# Patient Record
Sex: Female | Born: 1970
Health system: Southern US, Community
[De-identification: ages and names within clinical notes are randomized; demographics above are authoritative.]

## PROBLEM LIST (undated history)

## (undated) DIAGNOSIS — K219 Gastro-esophageal reflux disease without esophagitis: Secondary | ICD-10-CM

## (undated) DIAGNOSIS — I10 Essential (primary) hypertension: Secondary | ICD-10-CM

## (undated) DIAGNOSIS — E119 Type 2 diabetes mellitus without complications: Secondary | ICD-10-CM

## (undated) DIAGNOSIS — E785 Hyperlipidemia, unspecified: Secondary | ICD-10-CM

## (undated) HISTORY — PX: OTHER SURGICAL HISTORY: SHX169

## (undated) HISTORY — DX: Hyperlipidemia, unspecified: E78.5

## (undated) HISTORY — DX: Gastro-esophageal reflux disease without esophagitis: K21.9

## (undated) HISTORY — PX: UTERINE FIBROID SURGERY: SHX826

---

## 2008-09-21 ENCOUNTER — Inpatient Hospital Stay (HOSPITAL_COMMUNITY): Admission: AD | Admit: 2008-09-21 | Discharge: 2008-09-23 | Payer: Self-pay | Admitting: Obstetrics

## 2008-09-22 ENCOUNTER — Encounter: Payer: Self-pay | Admitting: Obstetrics & Gynecology

## 2008-10-22 ENCOUNTER — Ambulatory Visit (HOSPITAL_COMMUNITY): Admission: RE | Admit: 2008-10-22 | Discharge: 2008-10-22 | Payer: Self-pay | Admitting: Obstetrics

## 2009-06-01 ENCOUNTER — Emergency Department (HOSPITAL_COMMUNITY): Admission: EM | Admit: 2009-06-01 | Discharge: 2009-06-01 | Payer: Self-pay | Admitting: Emergency Medicine

## 2010-06-26 ENCOUNTER — Encounter: Payer: Self-pay | Admitting: Obstetrics and Gynecology

## 2010-06-26 ENCOUNTER — Inpatient Hospital Stay (HOSPITAL_COMMUNITY): Admission: AD | Admit: 2010-06-26 | Discharge: 2010-06-29 | Payer: Self-pay | Admitting: Obstetrics and Gynecology

## 2010-07-08 ENCOUNTER — Ambulatory Visit (HOSPITAL_COMMUNITY): Admission: RE | Admit: 2010-07-08 | Discharge: 2010-07-08 | Payer: Self-pay | Admitting: Obstetrics and Gynecology

## 2010-08-13 ENCOUNTER — Encounter
Admission: RE | Admit: 2010-08-13 | Discharge: 2010-10-12 | Payer: Self-pay | Source: Home / Self Care | Attending: Obstetrics and Gynecology | Admitting: Obstetrics and Gynecology

## 2010-10-28 ENCOUNTER — Inpatient Hospital Stay (HOSPITAL_COMMUNITY)
Admission: AD | Admit: 2010-10-28 | Discharge: 2010-10-28 | Payer: Self-pay | Source: Home / Self Care | Attending: Obstetrics and Gynecology | Admitting: Obstetrics and Gynecology

## 2010-12-11 ENCOUNTER — Encounter (HOSPITAL_COMMUNITY): Payer: Self-pay | Admitting: *Deleted

## 2010-12-11 ENCOUNTER — Inpatient Hospital Stay (HOSPITAL_COMMUNITY)
Admission: AD | Admit: 2010-12-11 | Discharge: 2010-12-15 | DRG: 765 | Disposition: A | Discharge status: Home / Self Care | Payer: Private Health Insurance - Indemnity | Source: Ambulatory Visit | Attending: Obstetrics and Gynecology | Admitting: Obstetrics and Gynecology

## 2010-12-11 DIAGNOSIS — O3430 Maternal care for cervical incompetence, unspecified trimester: Secondary | ICD-10-CM | POA: Diagnosis present

## 2010-12-11 DIAGNOSIS — O3490 Maternal care for abnormality of pelvic organ, unspecified, unspecified trimester: Principal | ICD-10-CM | POA: Diagnosis present

## 2010-12-11 DIAGNOSIS — E119 Type 2 diabetes mellitus without complications: Secondary | ICD-10-CM | POA: Diagnosis present

## 2010-12-11 DIAGNOSIS — O2432 Unspecified pre-existing diabetes mellitus in childbirth: Secondary | ICD-10-CM | POA: Diagnosis present

## 2010-12-11 DIAGNOSIS — O1002 Pre-existing essential hypertension complicating childbirth: Secondary | ICD-10-CM | POA: Diagnosis present

## 2010-12-11 LAB — COMPREHENSIVE METABOLIC PANEL
Albumin: 2.8 g/dL — ABNORMAL LOW (ref 3.5–5.2)
CO2: 23 mEq/L (ref 19–32)
Calcium: 9.1 mg/dL (ref 8.4–10.5)
Chloride: 106 mEq/L (ref 96–112)
GFR calc non Af Amer: >60 mL/min (ref >60)
Glucose, Bld: 67 mg/dL — ABNORMAL LOW (ref 70–99)
Potassium: 3.6 mEq/L (ref 3.5–5.1)
Total Bilirubin: 0.6 mg/dL (ref 0.3–1.2)

## 2010-12-11 LAB — GLUCOSE, CAPILLARY: Glucose-Capillary: 112 mg/dL — ABNORMAL HIGH (ref 70–99)

## 2010-12-11 LAB — CBC
Hemoglobin: 11.6 g/dL — ABNORMAL LOW (ref 12.0–15.0)
MCH: 23.9 pg — ABNORMAL LOW (ref 26.0–34.0)
MCHC: 32.5 g/dL (ref 30.0–36.0)
RBC: 4.86 MIL/uL (ref 3.87–5.11)
RDW: 14.9 % (ref 11.5–15.5)

## 2010-12-11 LAB — URIC ACID: Uric Acid, Serum: 4.8 mg/dL (ref 2.4–7.0)

## 2010-12-12 LAB — GLUCOSE, CAPILLARY
Glucose-Capillary: 69 mg/dL — ABNORMAL LOW (ref 70–99)
Glucose-Capillary: 113 mg/dL — ABNORMAL HIGH (ref 70–99)
Glucose-Capillary: 118 mg/dL — ABNORMAL HIGH (ref 70–99)
Glucose-Capillary: 55 mg/dL — ABNORMAL LOW (ref 70–99)
Glucose-Capillary: 91 mg/dL (ref 70–99)
Glucose-Capillary: 222 mg/dL — ABNORMAL HIGH (ref 70–99)

## 2010-12-12 LAB — CREATININE CLEARANCE, URINE, 24 HOUR: Creatinine: 0.61 mg/dL (ref 0.4–1.2)

## 2010-12-12 LAB — COMPREHENSIVE METABOLIC PANEL
ALT: 13 U/L (ref 0–35)
AST: 16 U/L (ref 0–37)
BUN: 8 mg/dL (ref 6–23)
CO2: 24 mEq/L (ref 19–32)
Calcium: 8.5 mg/dL (ref 8.4–10.5)
Glucose, Bld: 112 mg/dL — ABNORMAL HIGH (ref 70–99)
Sodium: 134 mEq/L — ABNORMAL LOW (ref 135–145)
Total Bilirubin: 0.6 mg/dL (ref 0.3–1.2)

## 2010-12-12 LAB — CBC
MCHC: 32.9 g/dL (ref 30.0–36.0)
Platelets: 177 10*3/uL (ref 150–400)
RDW: 15 % (ref 11.5–15.5)

## 2010-12-13 LAB — GLUCOSE, CAPILLARY
Glucose-Capillary: 199 mg/dL — ABNORMAL HIGH (ref 70–99)
Glucose-Capillary: 107 mg/dL — ABNORMAL HIGH (ref 70–99)
Glucose-Capillary: 176 mg/dL — ABNORMAL HIGH (ref 70–99)

## 2010-12-13 LAB — CBC
Hemoglobin: 12 g/dL (ref 12.0–15.0)
Platelets: 165 10*3/uL (ref 150–400)
RBC: 4.95 MIL/uL (ref 3.87–5.11)
WBC: 13.4 10*3/uL — ABNORMAL HIGH (ref 4.0–10.5)

## 2010-12-13 LAB — RPR: RPR Ser Ql: NONREACTIVE

## 2010-12-14 LAB — PROTEIN, URINE, 24 HOUR
Collection Interval-UPROT: 24 hours
Protein, 24H Urine: 155 mg/d — ABNORMAL HIGH (ref 50–100)
Urine Total Volume-UPROT: 5150 mL

## 2010-12-14 LAB — GLUCOSE, CAPILLARY
Glucose-Capillary: 66 mg/dL — ABNORMAL LOW (ref 70–99)
Glucose-Capillary: 131 mg/dL — ABNORMAL HIGH (ref 70–99)

## 2010-12-15 LAB — GLUCOSE, CAPILLARY: Glucose-Capillary: 160 mg/dL — ABNORMAL HIGH (ref 70–99)

## 2010-12-16 ENCOUNTER — Ambulatory Visit (HOSPITAL_COMMUNITY)
Admission: RE | Admit: 2010-12-16 | Discharge: 2010-12-16 | Disposition: A | Discharge status: Home / Self Care | Payer: Private Health Insurance - Indemnity | Source: Ambulatory Visit | Attending: Obstetrics and Gynecology | Admitting: Obstetrics and Gynecology

## 2010-12-16 DIAGNOSIS — O923 Agalactia: Secondary | ICD-10-CM | POA: Insufficient documentation

## 2010-12-17 NOTE — Discharge Summary (Signed)
NAME:  Yolanda Herrera, Yolanda Herrera          ACCOUNT NO.:  [id number]  MEDICAL RECORD NO.:  [id number]           PATIENT TYPE:  I  LOCATION:  9147                          FACILITY:  [facility]  PHYSICIAN:  Sherron Monday, MD        DATE OF BIRTH:  [birth year]  DATE OF ADMISSION:  12/11/2010 DATE OF DISCHARGE:  12/15/2010                              DISCHARGE SUMMARY   ADMITTING DIAGNOSES:  Intrauterine pregnancy at 35 plus weeks with increased blood pressure, worsening blood pressure with chronic hypertension, diabetes mellitus, poor obstetric history, incompetent cervix.  DISCHARGE DIAGNOSES:  Intrauterine pregnancy at 35 plus weeks with increased blood pressure, worsening blood pressure with chronic hypertension, diabetes mellitus, poor obstetric history, incompetent cervix, delivered.  HISTORY OF PRESENT ILLNESS:  A 40 year old G2, P 0-0-1-0 at 35 plus 6 weeks with chronic hypertension with a worsening blood pressures, diabetes mellitus controlled with insulin, hospitalized for 24-hour urine and blood pressure control.  Her pregnancy is also complicated by incompetent cervix with McDonald prophylactic cerclages x2 and a history of a 19-week demise.  She conceived with Femara.  She states she has had good fetal movement, no loss of fluid, no vaginal bleeding, occasional contractions.  No preeclamptic symptoms except for headache.  PAST MEDICAL HISTORY:  Significant for hypertension and diabetes.  SURGICAL HISTORY:  Significant for cerclage and a robotic myomectomy.  PAST OBSTETRIC HISTORY:  G1 was a 19-week IUFD with prolapsed cord.  G2 is the present pregnancy.  No history of any abnormal Pap smears or sexually transmitted diseases.  MEDICATIONS:  15 of NPH, 4 of regular in the morning, 6 of regular with lunch; 28 of NPH, 6 of regular with dinner.  Procardia XL 60 twice daily and prenatal vitamins.  ALLERGIES:  No known latex allergies.  No drug allergies.  SOCIAL HISTORY:  Denies  alcohol, tobacco or drug use.  She is married.  FAMILY HISTORY:  Significant for stroke, diabetes and hypertension.  PRENATAL LABORATORY FINDINGS:  O positive, antibody screen negative, hemoglobin 11.8, Pap smear within normal limits, rubella immune, RPR nonreactive, hepatitis B surface antigen negative, HIV negative, platelets 206,000, sickle trait, gonorrhea negative, Chlamydia negative, first and second trimester screen declined.  Hemoglobin A1c was 9.3 at the initial part of pregnancy and decreased to 6.5.  [facility] March 10 with normal anatomy, good growth, anterior placenta by good dating with Dr. April Manson aiding in her conception.  She has had multiple BPPs to follow up the pregnancy.  On admission, she was afebrile with blood pressures from 160-180 over 70-100 and benign exam.  Fetal heart tones in the 135-140s, reactive.  She was admitted.  Her diabetes was closely watched.  Labetalol was started to help in the blood pressure control. A 24-hour urine was also started.  Her case was discussed with MFM.  At this time I also discussed about risks, benefits and alternatives of a cesarean section including but not limited to bleeding, infection, damage to surrounding organs.  The patient voiced understanding, wished to proceed as well as trouble healing and possible keloiding of her scar.  Her blood pressures were much better controlled.  Discussed with the  patient the possible need for an acute care for the baby and this was also discussed with NICU and discussed the patient care with MFM, recommended delivery at 36 weeks secondary to blood pressures as well as history of myomectomy acting as a classical section and severe medical comorbidities.  The C-section was planned for that evening as well as removal of their cerclage.  Her headache was somewhat controlled with Tylenol.  She also had a NICU consult.  Her 24-hour urine revealed 155 mg of protein.  Her surgery was without  complications delivering a viable female infant at 2142 with Apgars of 7 at 1 minute and 9 at 5 minutes and a weight of 5 pounds and 12 ounces.  Normal uterus, filmy bladder flap adhesions and posterior uterine adhesions.  Normal tubes and ovaries.  EBL 700 mL.  Cerclage was removed with aid of a speculum and picked up some long scissors, without complications removed in entirety.  Her postpartum course was relatively uncomplicated.  She remained afebrile.  Vital signs stable.  Hemoglobin was stable from 11 to 12.  Her sugars were watched closely or mainly well controlled.  She had several lows, so her NovoLog was dropped.  She was discharged to home on postoperative day #3 with routine discharge instructions, numbers to call with any questions or problems.  She is discharged home with labetalol, Procardia, prenatal vitamins, insulin, Motrin and Percocet.  She will follow up in approximately 2 weeks.  She will continue to monitor her blood sugars and continue modified insulin regimen.  She was discharged home on 7 of NPH in the morning and 12 of NPH in the evening.  She voiced understanding of all of the instructions and will return and will call with any questions or problems.     Sherron Monday, MD     JB/MEDQ  D:  12/15/2010  T:  12/16/2010  Job:  [id number]  Electronically Signed by Sherron Monday MD on 12/17/2010 05:27:36 PM

## 2010-12-17 NOTE — Op Note (Signed)
NAME:  Yolanda Herrera, Yolanda Herrera          ACCOUNT NO.:  [id number]  MEDICAL RECORD NO.:  [id number]           PATIENT TYPE:  I  LOCATION:  9147                          [facility]  [facility]  PHYSICIAN:  Sherron Monday, MD        DATE OF BIRTH:  [birth year]  DATE OF PROCEDURE:  12/12/2010 DATE OF DISCHARGE:                              OPERATIVE REPORT   PREOPERATIVE DIAGNOSES: 1. Intrauterine pregnancy at 36 weeks. 2. History of myomectomy and recommend cesarean delivery. 3. Chronic hypertension, worsening blood pressure. 4. Diabetes. 5. Poor obstetrical history.  After discussion with MFM regarding the patient and recommended delivery at 36 week.  POSTOPERATIVE DIAGNOSES: 1. Intrauterine pregnancy at 36 weeks. 2. History of myomectomy and recommend cesarean delivery. 3. Chronic hypertension, worsening blood pressure. 4. Diabetes. 5. Poor obstetrical history. 6. Delivered.  PROCEDURE:  Primary low transverse cesarean section.  SURGEON:  Sherron Monday, MD  ANESTHESIA:  Spinal.  FINDINGS:  Viable female infant at 2142 with Apgars of 7 at 1 minute and 9 at 5 minutes had a weight of 5 pounds 12 ounces.  Normal uterus with filmy adhesions to bladder flap and posterior uterine wall.  Normal uterus, tubes, and ovaries.  ESTIMATED BLOOD LOSS:  700 mL.  IV FLUIDS:  1500 mL.  URINE OUTPUT:  100 mL clear urine at the end of the procedure.  PATHOLOGY:  None.  COMPLICATIONS:  None.  DISPOSITION:  Stable to PACU.  DESCRIPTION OF PROCEDURE:  After informed consent was reviewed with the patient including risks, benefits and alternatives of surgical procedure including, but not limited to bleeding, infection, damage to surrounding organs as well as trouble healing, she was transported to the OR where spinal anesthesia was placed and found to be adequate.  Prior to decision to proceed with cesarean delivery, the patient was discussed at length with MFM with the understanding that her blood  pressures were requiring blood pressure medicine and she had an poor OB history, but also history of a myomectomy necessitating cesarean delivery, therefore thinking of this is a history of classical section and wanting delivery at 36 weeks was recommended.  She was placed on the table in supine position with a leftward tilt, prepped and draped in the normal sterile fashion.  A Pfannenstiel skin incision was made at the level of 2 fingerbreadths above the pubic symphysis carried through the underlying layer of fascia sharply.  Fascia was incised in the midline.  The incision was extended laterally with Mayo scissors.  The inferior aspect of fascial incision was grasped with Kocher clamps,  elevated, and the rectus muscles were dissected off both bluntly and sharply.  Attention was turned to the superior portion which in a similar fashion was grasped with  Kocher clamps, elevating the rectus muscles were dissected off both bluntly and sharply.  Midline was easily identified. Peritoneum was entered bluntly.  This incision was extended superiorly and inferiorly with good visualization of the bladder.  Alexis skin retractor was placed carefully checking to make sure no bowel was entrapped.  The uterus was assessed.  The filmy uterine adhesions were incised.  The vesicouterine peritoneum was easily identified,  tented up with smooth pickups and bladder flap was created digitally and sharply. Uterus was incised in a transverse fashion.  Infant was delivered from the vertex presentation with aid of vacuum.  Nose and mouth were suctioned.  Cord was clamped and cut.  Infant was handed off to awaiting pediatric staff.  The placenta was expressed.  Uterus was cleared of all clots and debris and the uterine incision was outlined with sponge sticks and closed in 2 layers of 0 Monocryl, first of which is running locked and the second is imbricating layer.  This was found to be hemostatic in several  places were made hemostatic with Bovie cautery. Copious pelvic irrigation was performed.  The uterus, tubes, and ovaries were inspected with the above-mentioned findings.  The Alexis retractor was replaced.  The peritoneum was outlined with Kelly clamps, reapproximated with the rectus muscles using 2-0 Vicryl.  The subfascial planes and rectus muscles were inspected and found to be hemostatic closed in a running fashion.  Subcuticular adipose layer was made hemostatic with Bovie cautery, also irrigated.  A stitch of 2-0 plain gut was used to close the dead space.  The skin was reapproximated using 3-0 Monocryl.  Benzoin and Steri-Strips were applied as well.  The patient tolerated the procedure well.  Sponge, lap and needle counts were correct x2 at the end of the procedure.     Sherron Monday, MD     JB/MEDQ  D:  12/12/2010  T:  12/13/2010  Job:  [id number]  Electronically Signed by Sherron Monday MD on 12/17/2010 05:26:19 PM

## 2010-12-21 ENCOUNTER — Ambulatory Visit (HOSPITAL_COMMUNITY): Payer: Private Health Insurance - Indemnity | Attending: Obstetrics and Gynecology

## 2011-01-05 ENCOUNTER — Inpatient Hospital Stay (HOSPITAL_COMMUNITY)
Admission: RE | Admit: 2011-01-05 | Payer: Private Health Insurance - Indemnity | Source: Ambulatory Visit | Admitting: Obstetrics and Gynecology

## 2011-01-11 LAB — URINE CULTURE: Culture  Setup Time: 201112290116

## 2011-01-11 LAB — URINALYSIS, ROUTINE W REFLEX MICROSCOPIC
Glucose, UA: NEGATIVE mg/dL
Ketones, ur: NEGATIVE mg/dL
Nitrite: NEGATIVE
Specific Gravity, Urine: 1.02 (ref 1.005–1.030)
pH: 6 (ref 5.0–8.0)

## 2011-01-11 LAB — URINE MICROSCOPIC-ADD ON

## 2011-01-14 LAB — COMPREHENSIVE METABOLIC PANEL
ALT: 13 U/L (ref 0–35)
Albumin: 3.6 g/dL (ref 3.5–5.2)
Alkaline Phosphatase: 50 U/L (ref 39–117)
Chloride: 104 mEq/L (ref 96–112)
Glucose, Bld: 118 mg/dL — ABNORMAL HIGH (ref 70–99)
Potassium: 2.9 mEq/L — ABNORMAL LOW (ref 3.5–5.1)
Sodium: 135 mEq/L (ref 135–145)
Total Protein: 7.6 g/dL (ref 6.0–8.3)

## 2011-01-14 LAB — CBC
HCT: 37.2 % (ref 36.0–46.0)
Platelets: 251 10*3/uL (ref 150–400)
RBC: 5.04 MIL/uL (ref 3.87–5.11)
RDW: 18.4 % — ABNORMAL HIGH (ref 11.5–15.5)
WBC: 11.5 10*3/uL — ABNORMAL HIGH (ref 4.0–10.5)

## 2011-01-15 LAB — GLUCOSE, CAPILLARY
Glucose-Capillary: 132 mg/dL — ABNORMAL HIGH (ref 70–99)
Glucose-Capillary: 137 mg/dL — ABNORMAL HIGH (ref 70–99)
Glucose-Capillary: 212 mg/dL — ABNORMAL HIGH (ref 70–99)
Glucose-Capillary: 64 mg/dL — ABNORMAL LOW (ref 70–99)
Glucose-Capillary: 76 mg/dL (ref 70–99)
Glucose-Capillary: 77 mg/dL (ref 70–99)
Glucose-Capillary: 86 mg/dL (ref 70–99)

## 2011-01-15 LAB — PROTEIN, URINE, 24 HOUR
Collection Interval-UPROT: 24 hours
Protein, 24H Urine: 146 mg/d — ABNORMAL HIGH (ref 50–100)
Protein, Urine: 5 mg/dL
Urine Total Volume-UPROT: 2925 mL

## 2011-01-15 LAB — CREATININE CLEARANCE, URINE, 24 HOUR
Collection Interval-CRCL: 24 hours
Creatinine Clearance: 238 mL/min — ABNORMAL HIGH (ref 75–115)
Creatinine, 24H Ur: 1541 mg/d (ref 700–1800)
Creatinine: 0.45 mg/dL (ref 0.4–1.2)
Urine Total Volume-CRCL: 2925 mL

## 2011-02-07 LAB — DIFFERENTIAL
Basophils Relative: 1 % (ref 0–1)
Eosinophils Relative: 2 % (ref 0–5)
Lymphocytes Relative: 40 % (ref 12–46)
Monocytes Relative: 7 % (ref 3–12)
Neutrophils Relative %: 50 % (ref 43–77)

## 2011-02-07 LAB — POCT I-STAT, CHEM 8
BUN: 4 mg/dL — ABNORMAL LOW (ref 6–23)
Chloride: 101 mEq/L (ref 96–112)
Creatinine, Ser: 0.8 mg/dL (ref 0.4–1.2)
Potassium: 3 mEq/L — ABNORMAL LOW (ref 3.5–5.1)
Sodium: 140 mEq/L (ref 135–145)
TCO2: 26 mmol/L (ref 0–100)

## 2011-02-07 LAB — RPR: RPR Ser Ql: NONREACTIVE

## 2011-02-07 LAB — WET PREP, GENITAL: Trich, Wet Prep: NONE SEEN

## 2011-02-07 LAB — CBC
MCHC: 31.1 g/dL (ref 30.0–36.0)
MCV: 67.9 fL — ABNORMAL LOW (ref 78.0–100.0)
Platelets: 262 10*3/uL (ref 150–400)
RDW: 18.4 % — ABNORMAL HIGH (ref 11.5–15.5)
WBC: 7.9 10*3/uL (ref 4.0–10.5)

## 2011-02-07 LAB — URINALYSIS, ROUTINE W REFLEX MICROSCOPIC
Bilirubin Urine: NEGATIVE
Hgb urine dipstick: NEGATIVE
Specific Gravity, Urine: 1.008 (ref 1.005–1.030)
Urobilinogen, UA: 0.2 mg/dL (ref 0.0–1.0)

## 2011-02-07 LAB — GC/CHLAMYDIA PROBE AMP, GENITAL: Chlamydia, DNA Probe: NEGATIVE

## 2011-03-16 NOTE — Op Note (Signed)
Yolanda Herrera, Yolanda Herrera          ACCOUNT NO.:  [id number]   MEDICAL RECORD NO.:  [id number]          PATIENT TYPE:  INP   LOCATION:  9374                          [facility]ACILITY:  [facility]   PHYSICIAN:  Roseanna Rainbow, M.D.DATE OF BIRTH:  Oct 07, 1971   DATE OF PROCEDURE:  09/22/2008  DATE OF DISCHARGE:                               OPERATIVE REPORT   PREOPERATIVE DIAGNOSIS:  Retained products of conception.   POSTOPERATIVE DIAGNOSIS:  Retained products of conception.   PROCEDURE:  Suction, dilatation, and evacuation with ultrasound  guidance.   SURGEON:  Roseanna Rainbow, MD   ANESTHESIA:  Managed anesthesia care, paracervical block.   PATHOLOGY:  Placenta.   ESTIMATED BLOOD LOSS:  100 mL.   COMPLICATIONS:  None.   PROCEDURE:  The patient was taken to the operating room with an IV  running.  She was placed in the dorsal lithotomy position and prepped  and draped in the usual sterile fashion.  After a time-out had been  completed, a bivalve speculum was placed in the patient's vagina.  The  anterior lip of the cervix was infiltrated with 2 mL of 2% lidocaine.  The anterior lip of the cervix was then grasped with a single-tooth  tenaculum.  A 4 mL of 2% lidocaine were then injected at 4 and 7 o'clock  to produce a paracervical block.  The cervix was 1-2 cm dilated.  Again,  under ultrasound guidance, a 14-mm suction curette was then introduced  into the uterine cavity and the device was activated and rotated to  evacuate the remaining products of conception.  Ovum crushing forceps  were also used to grasp the products of conception under ultrasound  guidance.  A sharp curettage was then performed.  A final pass was made  with the suction curette.  The single-tooth tenaculum was then removed  with minimal bleeding noted from the cervix.  At the close of the  procedure, the instrument and pack counts were said to be correct x2.  The patient was taken to the PACU, awake and in  stable condition.      Roseanna Rainbow, M.D.  Electronically Signed     LAJ/MEDQ  D:  09/22/2008  T:  09/22/2008  Job:  [id number]   cc:   Kathreen Cosier, M.D.  Fax: [phone]

## 2011-03-16 NOTE — Discharge Summary (Signed)
NAMEDEBY, ADGER          ACCOUNT NO.:  [id number]   MEDICAL RECORD NO.:  [id number]          PATIENT TYPE:  INP   LOCATION:  9318                          [facility]  [facility]   PHYSICIAN:  Kathreen Cosier, M.D.DATE OF BIRTH:  08-Apr-1971   DATE OF ADMISSION:  09/21/2008  DATE OF DISCHARGE:  09/23/2008                               DISCHARGE SUMMARY   The patient is a 40 year old primigravida, 32 weeks' pregnant with a  history of myomas, who was admitted with a prolapsed cord and second  trimester fetal demise, which she delivered spontaneously.  Placenta did  not pass on that day, and she was taken to the operating room on  November 22 and D&E performed for retained products.  On November 23,  she was discharged on Norvasc 10 mg p.o. daily, which she had been  taking throughout the pregnancy.  Motrin 800 q. 8.  See me in 2 weeks.   DISCHARGE DIAGNOSIS:  Status post prolapsed cord and second trimester  intrauterine fetal demise.           ______________________________  Kathreen Cosier, M.D.     BAM/MEDQ  D:  10/16/2008  T:  10/16/2008  Job:  [id number]

## 2011-08-04 LAB — CBC
HCT: 26.5 — ABNORMAL LOW
HCT: 29.7 — ABNORMAL LOW
HCT: 38.6
Hemoglobin: 12.7
Hemoglobin: 9.7 — ABNORMAL LOW
MCHC: 32.5
MCHC: 32.8
MCHC: 32.8
MCV: 77.7 — ABNORMAL LOW
MCV: 78.7
Platelets: 184
Platelets: 224
RBC: 3.77 — ABNORMAL LOW
RBC: 4.96
RDW: 20.6 — ABNORMAL HIGH
RDW: 20.7 — ABNORMAL HIGH
RDW: 21.5 — ABNORMAL HIGH
WBC: 15.4 — ABNORMAL HIGH
WBC: 16.1 — ABNORMAL HIGH

## 2011-08-04 LAB — COMPREHENSIVE METABOLIC PANEL
ALT: 13
ALT: 14
AST: 15
AST: 19
Alkaline Phosphatase: 66
CO2: 24
Calcium: 8.8
Chloride: 103
Creatinine, Ser: 0.53
Creatinine, Ser: 0.63
GFR calc Af Amer: >60
GFR calc Af Amer: >60
GFR calc non Af Amer: >60
Potassium: 3.5
Sodium: 130 — ABNORMAL LOW
Total Bilirubin: 0.5
Total Protein: 6.7

## 2011-08-04 LAB — BASIC METABOLIC PANEL
BUN: 1 — ABNORMAL LOW
CO2: 24
Chloride: 100
Creatinine, Ser: 0.63
GFR calc Af Amer: >60
GFR calc non Af Amer: >60
Glucose, Bld: 114 — ABNORMAL HIGH
Glucose, Bld: 179 — ABNORMAL HIGH
Potassium: 3.4 — ABNORMAL LOW
Potassium: 4.3
Sodium: 131 — ABNORMAL LOW

## 2011-08-04 LAB — DIFFERENTIAL
Basophils Absolute: 0
Eosinophils Relative: 0
Lymphocytes Relative: 6 — ABNORMAL LOW
Monocytes Absolute: 0.3
Monocytes Relative: 1 — ABNORMAL LOW
Neutro Abs: 22.9 — ABNORMAL HIGH

## 2011-08-04 LAB — RPR: RPR Ser Ql: NONREACTIVE

## 2011-08-04 LAB — ANTITHROMBIN III: AntiThromb III Func: 95

## 2011-08-04 LAB — ANTIPHOSPHOLIPID SYNDROME EVAL, BLD
Anticardiolipin IgG: <7 — ABNORMAL LOW (ref <11)
Anticardiolipin IgM: <7 — ABNORMAL LOW (ref <10)
Antiphosphatidylserine IgG: <11 GPS U/mL (ref <11.0)
DRVVT: 45.7 (ref 36.1–47.0)
Lupus Anticoagulant: NOT DETECTED

## 2011-08-04 LAB — DIC (DISSEMINATED INTRAVASCULAR COAGULATION)PANEL
Fibrinogen: 763 — ABNORMAL HIGH
INR: 1.1
aPTT: 29

## 2013-12-30 ENCOUNTER — Emergency Department (HOSPITAL_COMMUNITY)
Admission: EM | Admit: 2013-12-30 | Discharge: 2013-12-30 | Disposition: A | Discharge status: Home / Self Care | Payer: BC Managed Care – PPO | Attending: Emergency Medicine | Admitting: Emergency Medicine

## 2013-12-30 ENCOUNTER — Emergency Department (HOSPITAL_COMMUNITY): Payer: BC Managed Care – PPO

## 2013-12-30 ENCOUNTER — Encounter (HOSPITAL_COMMUNITY): Payer: Self-pay | Admitting: Emergency Medicine

## 2013-12-30 DIAGNOSIS — E119 Type 2 diabetes mellitus without complications: Secondary | ICD-10-CM | POA: Insufficient documentation

## 2013-12-30 DIAGNOSIS — R42 Dizziness and giddiness: Secondary | ICD-10-CM | POA: Insufficient documentation

## 2013-12-30 DIAGNOSIS — R0602 Shortness of breath: Secondary | ICD-10-CM | POA: Insufficient documentation

## 2013-12-30 DIAGNOSIS — R5383 Other fatigue: Secondary | ICD-10-CM

## 2013-12-30 DIAGNOSIS — Z3202 Encounter for pregnancy test, result negative: Secondary | ICD-10-CM | POA: Insufficient documentation

## 2013-12-30 DIAGNOSIS — R059 Cough, unspecified: Secondary | ICD-10-CM | POA: Insufficient documentation

## 2013-12-30 DIAGNOSIS — Z79899 Other long term (current) drug therapy: Secondary | ICD-10-CM | POA: Insufficient documentation

## 2013-12-30 DIAGNOSIS — R112 Nausea with vomiting, unspecified: Secondary | ICD-10-CM | POA: Insufficient documentation

## 2013-12-30 DIAGNOSIS — R5381 Other malaise: Secondary | ICD-10-CM | POA: Insufficient documentation

## 2013-12-30 DIAGNOSIS — R05 Cough: Secondary | ICD-10-CM | POA: Insufficient documentation

## 2013-12-30 DIAGNOSIS — Z792 Long term (current) use of antibiotics: Secondary | ICD-10-CM | POA: Insufficient documentation

## 2013-12-30 DIAGNOSIS — R739 Hyperglycemia, unspecified: Secondary | ICD-10-CM

## 2013-12-30 DIAGNOSIS — I10 Essential (primary) hypertension: Secondary | ICD-10-CM | POA: Insufficient documentation

## 2013-12-30 HISTORY — DX: Essential (primary) hypertension: I10

## 2013-12-30 HISTORY — DX: Type 2 diabetes mellitus without complications: E11.9

## 2013-12-30 LAB — CBC WITH DIFFERENTIAL/PLATELET
Basophils Absolute: 0 10*3/uL (ref 0.0–0.1)
Basophils Relative: 0 % (ref 0–1)
EOS PCT: 0 % (ref 0–5)
Eosinophils Absolute: 0 10*3/uL (ref 0.0–0.7)
HEMATOCRIT: 38.5 % (ref 36.0–46.0)
Hemoglobin: 12.6 g/dL (ref 12.0–15.0)
LYMPHS ABS: 0.9 10*3/uL (ref 0.7–4.0)
Lymphocytes Relative: 11 % — ABNORMAL LOW (ref 12–46)
MCH: 21.5 pg — ABNORMAL LOW (ref 26.0–34.0)
MCHC: 32.7 g/dL (ref 30.0–36.0)
MCV: 65.6 fL — AB (ref 78.0–100.0)
MONOS PCT: 3 % (ref 3–12)
Monocytes Absolute: 0.2 10*3/uL (ref 0.1–1.0)
NEUTROS ABS: 6.9 10*3/uL (ref 1.7–7.7)
Neutrophils Relative %: 86 % — ABNORMAL HIGH (ref 43–77)
Platelets: 182 10*3/uL (ref 150–400)
RBC: 5.87 MIL/uL — AB (ref 3.87–5.11)
RDW: 17.2 % — ABNORMAL HIGH (ref 11.5–15.5)
WBC: 8 10*3/uL (ref 4.0–10.5)

## 2013-12-30 LAB — URINALYSIS, ROUTINE W REFLEX MICROSCOPIC
BILIRUBIN URINE: NEGATIVE
Hgb urine dipstick: NEGATIVE
KETONES UR: 15 mg/dL — AB
Nitrite: NEGATIVE
PROTEIN: NEGATIVE mg/dL
Specific Gravity, Urine: 1.021 (ref 1.005–1.030)
Urobilinogen, UA: 0.2 mg/dL (ref 0.0–1.0)
pH: 7 (ref 5.0–8.0)

## 2013-12-30 LAB — BLOOD GAS, VENOUS
Acid-Base Excess: 3.3 mmol/L — ABNORMAL HIGH (ref 0.0–2.0)
BICARBONATE: 25.2 meq/L — AB (ref 20.0–24.0)
Drawn by: 295031
FIO2: 0.21 %
O2 Saturation: 64.1 %
PATIENT TEMPERATURE: 98.6
PCO2 VEN: 30.5 mmHg — AB (ref 45.0–50.0)
PH VEN: 7.526 — AB (ref 7.250–7.300)
PO2 VEN: 32.9 mmHg (ref 30.0–45.0)
TCO2: 22.1 mmol/L (ref 0–100)

## 2013-12-30 LAB — CBG MONITORING, ED
Glucose-Capillary: 465 mg/dL — ABNORMAL HIGH (ref 70–99)
Glucose-Capillary: 375 mg/dL — ABNORMAL HIGH (ref 70–99)
Glucose-Capillary: 319 mg/dL — ABNORMAL HIGH (ref 70–99)
Glucose-Capillary: 291 mg/dL — ABNORMAL HIGH (ref 70–99)

## 2013-12-30 LAB — COMPREHENSIVE METABOLIC PANEL
ALT: 16 U/L (ref 0–35)
AST: 17 U/L (ref 0–37)
Albumin: 4 g/dL (ref 3.5–5.2)
Alkaline Phosphatase: 79 U/L (ref 39–117)
BILIRUBIN TOTAL: 0.2 mg/dL — AB (ref 0.3–1.2)
BUN: 13 mg/dL (ref 6–23)
CHLORIDE: 90 meq/L — AB (ref 96–112)
CO2: 24 meq/L (ref 19–32)
CREATININE: 0.83 mg/dL (ref 0.50–1.10)
Calcium: 9.8 mg/dL (ref 8.4–10.5)
GFR calc Af Amer: >90 mL/min (ref >90)
GFR, EST NON AFRICAN AMERICAN: 85 mL/min — AB (ref >90)
Glucose, Bld: 539 mg/dL — ABNORMAL HIGH (ref 70–99)
Potassium: 3.1 mEq/L — ABNORMAL LOW (ref 3.7–5.3)
Sodium: 134 mEq/L — ABNORMAL LOW (ref 137–147)
Total Protein: 7.6 g/dL (ref 6.0–8.3)

## 2013-12-30 LAB — PREGNANCY, URINE: Preg Test, Ur: NEGATIVE

## 2013-12-30 LAB — URINE MICROSCOPIC-ADD ON

## 2013-12-30 LAB — LIPASE, BLOOD: Lipase: 35 U/L (ref 11–59)

## 2013-12-30 MED ORDER — SODIUM CHLORIDE 0.9 % IV BOLUS (SEPSIS)
1000.0000 mL | Freq: Once | INTRAVENOUS | Status: AC
  Administered 2013-12-30: 1000 mL via INTRAVENOUS

## 2013-12-30 MED ORDER — DEXTROSE-NACL 5-0.45 % IV SOLN: INTRAVENOUS | Status: DC

## 2013-12-30 MED ORDER — SODIUM CHLORIDE 0.9 % IV SOLN: INTRAVENOUS | Status: DC

## 2013-12-30 MED ORDER — INSULIN REGULAR BOLUS VIA INFUSION
0.0000 [IU] | Freq: Three times a day (TID) | INTRAVENOUS | Status: DC
  Filled 2013-12-30: qty 10

## 2013-12-30 MED ORDER — SODIUM CHLORIDE 0.9 % IV SOLN
INTRAVENOUS | Status: DC
  Administered 2013-12-30: 3.2 [IU]/h via INTRAVENOUS
  Filled 2013-12-30: qty 1

## 2013-12-30 MED ORDER — DEXTROSE 50 % IV SOLN: 25.0000 mL | INTRAVENOUS | Status: DC | PRN

## 2013-12-30 NOTE — ED Notes (Signed)
Pt from home reports emesis x1 day, 10/10 abd pain. Pt denies diarrhea, fever, body aches. Pt has hx of DM. Pt is A&O and in NAD

## 2013-12-30 NOTE — ED Provider Notes (Signed)
CSN: [id number]     Arrival date & time 12/30/13  1247 History   First MD Initiated Contact with Patient 12/30/13 1325     Chief Complaint  Patient presents with  . Weakness  . Emesis  . Hyperglycemia     (Consider location/radiation/quality/duration/timing/severity/associated sxs/prior Treatment) The history is provided by the patient.   Patient was feeling well until this morning after breakfast when she developed dizziness,  N/V and then SOB.  Vomited twice, emesis was contents of her stomach.  Shortness of breath began after vomiting, lasted until she was on the way to the ED in ambulance. Denies abdominal pain, chest pain, cough, urinary, vaginal, or bowel symptoms.  Pt has hx diabetes, has been taking her medications as prescribed.  Has recently started taking doxycycline for URI that is not worsened.  No hx DKA or hospitalization for her diabetes.     Past Medical History  Diagnosis Date  . Diabetes mellitus without complication   . Hypertension    Past Surgical History  Procedure Laterality Date  . Uterine fibroid surgery     No family history on file. History  Substance Use Topics  . Smoking status: Never Smoker   . Smokeless tobacco: Not on file  . Alcohol Use: No   OB History   Grav Para Term Preterm Abortions TAB SAB Ect Mult Living   1              Review of Systems  Constitutional: Negative for fever.  Respiratory: Positive for cough and shortness of breath.   Cardiovascular: Negative for chest pain.  Gastrointestinal: Positive for nausea and vomiting. Negative for abdominal pain and diarrhea.  Genitourinary: Negative for dysuria, urgency, frequency, vaginal bleeding and vaginal discharge.  Neurological: Positive for dizziness.  All other systems reviewed and are negative.      Allergies  Review of patient's allergies indicates no known allergies.  Home Medications   Current Outpatient Rx  Name  Route  Sig  Dispense  Refill  . amLODipine  (NORVASC) 10 MG tablet   Oral   Take 1 tablet by mouth daily.         Marland Kitchen. doxycycline (VIBRA-TABS) 100 MG tablet   Oral   Take 1 tablet by mouth 2 (two) times daily.         . hydrALAZINE (APRESOLINE) 100 MG tablet   Oral   Take 1 tablet by mouth 4 (four) times daily as needed.         Marland Kitchen. ibuprofen (ADVIL,MOTRIN) 200 MG tablet   Oral   Take 400 mg by mouth every 6 (six) hours as needed for moderate pain.         Marland Kitchen. lisinopril-hydrochlorothiazide (PRINZIDE,ZESTORETIC) 20-12.5 MG per tablet   Oral   Take 1 tablet by mouth daily.         . metFORMIN (GLUCOPHAGE) 1000 MG tablet   Oral   Take 1 tablet by mouth daily.          BP 145/70  Pulse 86  Temp(Src) 97.9 F (36.6 C) (Oral)  Resp 16  SpO2 98%  LMP 12/28/2013  Breastfeeding? Unknown Physical Exam  Nursing note and vitals reviewed. Constitutional: She appears well-developed and well-nourished. No distress.  HENT:  Head: Normocephalic and atraumatic.  Dry mucous membranes  Neck: Normal range of motion. Neck supple.  Cardiovascular: Normal rate and regular rhythm.   Pulmonary/Chest: Effort normal and breath sounds normal. No respiratory distress. She has no wheezes. She has no  rales.  Abdominal: Soft. She exhibits no distension. There is no tenderness. There is no rebound and no guarding.  Neurological: She is alert.  Skin: She is not diaphoretic.    ED Course  Procedures (including critical care time) Labs Review Labs Reviewed  CBC WITH DIFFERENTIAL - Abnormal; Notable for the following:    RBC 5.87 (*)    MCV 65.6 (*)    MCH 21.5 (*)    RDW 17.2 (*)    Neutrophils Relative % 86 (*)    Lymphocytes Relative 11 (*)    All other components within normal limits  COMPREHENSIVE METABOLIC PANEL - Abnormal; Notable for the following:    Sodium 134 (*)    Potassium 3.1 (*)    Chloride 90 (*)    Glucose, Bld 539 (*)    Total Bilirubin 0.2 (*)    GFR calc non Af Amer 85 (*)    All other components within  normal limits  URINALYSIS, ROUTINE W REFLEX MICROSCOPIC - Abnormal; Notable for the following:    APPearance CLOUDY (*)    Glucose, UA >1000 (*)    Ketones, ur 15 (*)    Leukocytes, UA SMALL (*)    All other components within normal limits  BLOOD GAS, VENOUS - Abnormal; Notable for the following:    pH, Ven 7.526 (*)    pCO2, Ven 30.5 (*)    Bicarbonate 25.2 (*)    Acid-Base Excess 3.3 (*)    All other components within normal limits  URINE MICROSCOPIC-ADD ON - Abnormal; Notable for the following:    Squamous Epithelial / LPF FEW (*)    All other components within normal limits  CBG MONITORING, ED - Abnormal; Notable for the following:    Glucose-Capillary 465 (*)    All other components within normal limits  LIPASE, BLOOD  PREGNANCY, URINE   Imaging Review Dg Chest 2 View  12/30/2013   CLINICAL DATA:  Weakness, shortness of breath, emesis and hyperglycemia.  EXAM: CHEST  2 VIEW  COMPARISON:  None.  FINDINGS: The heart size and mediastinal contours are within normal limits. Both lungs are clear. The visualized skeletal structures are unremarkable.  IMPRESSION: No active cardiopulmonary disease.   Electronically Signed   By: Elberta Fortis M.D.   On: 12/30/2013 14:45     EKG Interpretation None      PERC negative.   3:36 PM Pt reports she is starting to feel much better after IVF have started.  No nausea.  No SOB.  Denies any pain.   3:42 PM Discussed patient and plan with Dr Blinda Leatherwood.     MDM   Final diagnoses:  Hyperglycemia   Pt p/w self-limited dizziness, N/V x 2, SOB this morning.  Feeling better in ED and after IVF started.  Likely due to hyperglycemia which I suspect has occurred due to active URI.  She has an anion gap of 19 but is not acidotic.  I believe this is due to dehydration.    Discussed patient with Elpidio Anis, PA-C, who assumes care of patient at change of shift.  Plan is for IVF, glucose stabilizer, reassessment, anticipate d/c home.     [place],  PA-C 12/30/13 1607

## 2013-12-30 NOTE — ED Notes (Signed)
Insulin drip rate changed to 6.9 units/hr.

## 2013-12-30 NOTE — ED Notes (Signed)
Insulin drip rate changed to 5.2 units/hr

## 2013-12-30 NOTE — ED Provider Notes (Signed)
[facility] [place], with one day history N, V in a known usually well controlled type 2 diabetic. Here she appears stable and improved with fluids and medications. No further vomiting. Found to be hyperglycemic thought secondary to recent infection, being treated with doxycycline. Currently receiving bolus of fluids and it is anticipated she will go home when blood sugar is brought under control.   Arnoldo HookerShari A Lashona Schaaf, PA-C 12/30/13 1549  1745:  She is resting well. Discussed with Dr. Estell HarpinZammit. Patient stable for discharge with CBG <300. She will follow up with her PCP this week.   Arnoldo HookerShari A Sabina Beavers, PA-C 01/01/14 1030

## 2013-12-30 NOTE — ED Notes (Signed)
Per EMS pt comes from home for weakness, vomiting and hyperglycemia. Pt went to PCP on Friday and not feeling any better today.

## 2013-12-30 NOTE — Discharge Instructions (Signed)

## 2013-12-31 NOTE — ED Provider Notes (Signed)
Medical screening examination/treatment/procedure(s) were performed by non-physician practitioner and as supervising physician I was immediately available for consultation/collaboration.   EKG Interpretation None        Gilda Creasehristopher J. Pollina, MD 12/31/13 [phone]

## 2014-01-02 NOTE — ED Provider Notes (Signed)
Medical screening examination/treatment/procedure(s) were performed by non-physician practitioner and as supervising physician I was immediately available for consultation/collaboration.   EKG Interpretation None        Gilda Creasehristopher J. Pollina, MD 01/02/14 [phone]

## 2014-07-31 ENCOUNTER — Other Ambulatory Visit (HOSPITAL_COMMUNITY): Payer: Self-pay | Admitting: Internal Medicine

## 2014-07-31 DIAGNOSIS — Z1231 Encounter for screening mammogram for malignant neoplasm of breast: Secondary | ICD-10-CM

## 2014-08-13 ENCOUNTER — Ambulatory Visit (HOSPITAL_COMMUNITY)
Admission: RE | Admit: 2014-08-13 | Discharge: 2014-08-13 | Disposition: A | Discharge status: Home / Self Care | Payer: BC Managed Care – PPO | Source: Ambulatory Visit | Attending: Internal Medicine | Admitting: Internal Medicine

## 2014-08-13 DIAGNOSIS — Z1231 Encounter for screening mammogram for malignant neoplasm of breast: Secondary | ICD-10-CM

## 2014-08-15 ENCOUNTER — Other Ambulatory Visit: Payer: Self-pay | Admitting: Internal Medicine

## 2014-08-15 DIAGNOSIS — R928 Other abnormal and inconclusive findings on diagnostic imaging of breast: Secondary | ICD-10-CM

## 2014-08-26 ENCOUNTER — Other Ambulatory Visit: Payer: BC Managed Care – PPO

## 2014-09-02 ENCOUNTER — Encounter (HOSPITAL_COMMUNITY): Payer: Self-pay | Admitting: Emergency Medicine

## 2014-09-05 ENCOUNTER — Ambulatory Visit
Admission: RE | Admit: 2014-09-05 | Discharge: 2014-09-05 | Disposition: A | Discharge status: Home / Self Care | Payer: BC Managed Care – PPO | Source: Ambulatory Visit | Attending: Internal Medicine | Admitting: Internal Medicine

## 2014-09-05 DIAGNOSIS — R928 Other abnormal and inconclusive findings on diagnostic imaging of breast: Secondary | ICD-10-CM

## 2015-08-07 ENCOUNTER — Other Ambulatory Visit: Payer: Self-pay

## 2015-08-07 DIAGNOSIS — Z1231 Encounter for screening mammogram for malignant neoplasm of breast: Secondary | ICD-10-CM

## 2015-08-15 ENCOUNTER — Ambulatory Visit: Payer: Self-pay

## 2015-11-21 ENCOUNTER — Ambulatory Visit: Payer: Self-pay

## 2016-02-27 ENCOUNTER — Ambulatory Visit
Admission: RE | Admit: 2016-02-27 | Discharge: 2016-02-27 | Disposition: A | Discharge status: Home / Self Care | Payer: BLUE CROSS/BLUE SHIELD | Source: Ambulatory Visit

## 2016-02-27 DIAGNOSIS — Z1231 Encounter for screening mammogram for malignant neoplasm of breast: Secondary | ICD-10-CM

## 2016-03-02 ENCOUNTER — Other Ambulatory Visit: Payer: Self-pay | Admitting: Internal Medicine

## 2016-03-02 DIAGNOSIS — R928 Other abnormal and inconclusive findings on diagnostic imaging of breast: Secondary | ICD-10-CM

## 2016-03-10 ENCOUNTER — Other Ambulatory Visit: Payer: BLUE CROSS/BLUE SHIELD

## 2016-03-10 ENCOUNTER — Inpatient Hospital Stay: Admission: RE | Admit: 2016-03-10 | Payer: BLUE CROSS/BLUE SHIELD | Source: Ambulatory Visit

## 2016-04-08 ENCOUNTER — Ambulatory Visit
Admission: RE | Admit: 2016-04-08 | Discharge: 2016-04-08 | Disposition: A | Discharge status: Home / Self Care | Payer: BLUE CROSS/BLUE SHIELD | Source: Ambulatory Visit | Attending: Internal Medicine | Admitting: Internal Medicine

## 2016-04-08 ENCOUNTER — Other Ambulatory Visit: Payer: Self-pay | Admitting: Internal Medicine

## 2016-04-08 DIAGNOSIS — R928 Other abnormal and inconclusive findings on diagnostic imaging of breast: Secondary | ICD-10-CM

## 2016-04-23 ENCOUNTER — Ambulatory Visit
Admission: RE | Admit: 2016-04-23 | Discharge: 2016-04-23 | Disposition: A | Discharge status: Home / Self Care | Payer: BLUE CROSS/BLUE SHIELD | Source: Ambulatory Visit | Attending: Internal Medicine | Admitting: Internal Medicine

## 2016-04-23 ENCOUNTER — Other Ambulatory Visit: Payer: Self-pay | Admitting: Internal Medicine

## 2016-04-23 DIAGNOSIS — R928 Other abnormal and inconclusive findings on diagnostic imaging of breast: Secondary | ICD-10-CM

## 2016-06-09 ENCOUNTER — Encounter: Payer: Self-pay | Admitting: Podiatry

## 2016-06-09 ENCOUNTER — Ambulatory Visit (INDEPENDENT_AMBULATORY_CARE_PROVIDER_SITE_OTHER): Payer: BLUE CROSS/BLUE SHIELD | Admitting: Podiatry

## 2016-06-09 VITALS — BP 175/104 | HR 77 | Resp 12

## 2016-06-09 DIAGNOSIS — B351 Tinea unguium: Secondary | ICD-10-CM | POA: Diagnosis not present

## 2016-06-09 DIAGNOSIS — M79676 Pain in unspecified toe(s): Secondary | ICD-10-CM

## 2016-06-09 DIAGNOSIS — E119 Type 2 diabetes mellitus without complications: Secondary | ICD-10-CM | POA: Diagnosis not present

## 2016-06-09 NOTE — Progress Notes (Signed)
   Subjective:    Patient ID: Yolanda Herrera, female    DOB: [birth year], 45 y.o.   MRN: [id number]  HPI    This patient presents today complaining of toenails that have gradually changed in thickness and color over a 10 year.. She also of discomfort in the toenails when wearing shoes and difficulty trimming the toenails. Patient recalls some history of trauma approximately 10 years ago resulting in the sloughing of one of the hallux toenails and subsequently the affected nail gradually thickened over time. Patient describes self treatment including soaking and applying Vaseline to the nails without any change in the appearance of the toenails  Patient is type II diabetic and denies any history of foot ulceration, claudication or amputation  Review of Systems  Skin: Positive for color change.       Objective:   Physical Exam  Orientated 3  Vascular: DP and PT pulses 2/4 bilaterally Capillary reflex immediate bilaterally  Neurological: Sensation to 10 g monofilament wire intact 5/5 bilaterally Vibratory sensation reactive bilaterally Ankle reflex equal and reactive bilaterally  Dermatological: No open skin lesions bilaterally The toenails are elongated, discolored, hypertrophic with subungual debris and tender to direct palpation 6-10  Musculoskeletal: No deformities noted bilaterally No restriction ankle, subtalar, midtarsal joints bilaterally      Assessment & Plan:   Assessment: Satisfactory neurovascular status Diabetic without foot complications Mycotic toenails 6-10  Plan: Discuss treatment options with patient including no treatment, repetitive debridement, permanent nail removal or oral medication.. Patient has interested in definitive treatment with oral medication. The left hallux nail was debrided and the nail fragments submitted for PAS and fungal culture  Notify patient upon results of lab

## 2016-06-09 NOTE — Patient Instructions (Signed)
Today your diabetic foot screen demonstrated adequate circulation and feeling in your feet I'm submitting and nail fragment to the lab to evaluate for possible fungal infection. If the lab is positive I'll have you return to discuss the use of oral medication. Often the lab takes 30+ days to return. Our office will contact you when we have results of the lab  Diabetes and Foot Care Diabetes may cause you to have problems because of poor blood supply (circulation) to your feet and legs. This may cause the skin on your feet to become thinner, break easier, and heal more slowly. Your skin may become dry, and the skin may peel and crack. You may also have nerve damage in your legs and feet causing decreased feeling in them. You may not notice minor injuries to your feet that could lead to infections or more serious problems. Taking care of your feet is one of the most important things you can do for yourself.  HOME CARE INSTRUCTIONS  Wear shoes at all times, even in the house. Do not go barefoot. Bare feet are easily injured.  Check your feet daily for blisters, cuts, and redness. If you cannot see the bottom of your feet, use a mirror or ask someone for help.  Wash your feet with warm water (do not use hot water) and mild soap. Then pat your feet and the areas between your toes until they are completely dry. Do not soak your feet as this can dry your skin.  Apply a moisturizing lotion or petroleum jelly (that does not contain alcohol and is unscented) to the skin on your feet and to dry, brittle toenails. Do not apply lotion between your toes.  Trim your toenails straight across. Do not dig under them or around the cuticle. File the edges of your nails with an emery board or nail file.  Do not cut corns or calluses or try to remove them with medicine.  Wear clean socks or stockings every day. Make sure they are not too tight. Do not wear knee-high stockings since they may decrease blood flow to your  legs.  Wear shoes that fit properly and have enough cushioning. To break in new shoes, wear them for just a few hours a day. This prevents you from injuring your feet. Always look in your shoes before you put them on to be sure there are no objects inside.  Do not cross your legs. This may decrease the blood flow to your feet.  If you find a minor scrape, cut, or break in the skin on your feet, keep it and the skin around it clean and dry. These areas may be cleansed with mild soap and water. Do not cleanse the area with peroxide, alcohol, or iodine.  When you remove an adhesive bandage, be sure not to damage the skin around it.  If you have a wound, look at it several times a day to make sure it is healing.  Do not use heating pads or hot water bottles. They may burn your skin. If you have lost feeling in your feet or legs, you may not know it is happening until it is too late.  Make sure your health care provider performs a complete foot exam at least annually or more often if you have foot problems. Report any cuts, sores, or bruises to your health care provider immediately. SEEK MEDICAL CARE IF:   You have an injury that is not healing.  You have cuts or breaks  in the skin.  You have an ingrown nail.  You notice redness on your legs or feet.  You feel burning or tingling in your legs or feet.  You have pain or cramps in your legs and feet.  Your legs or feet are numb.  Your feet always feel cold. SEEK IMMEDIATE MEDICAL CARE IF:   There is increasing redness, swelling, or pain in or around a wound.  There is a red line that goes up your leg.  Pus is coming from a wound.  You develop a fever or as directed by your health care provider.  You notice a bad smell coming from an ulcer or wound.   This information is not intended to replace advice given to you by your health care provider. Make sure you discuss any questions you have with your health care provider.    Document Released: 10/15/2000 Document Revised: 06/20/2013 Document Reviewed: 03/27/2013 Elsevier Interactive Patient Education [employer].

## 2016-07-20 ENCOUNTER — Other Ambulatory Visit: Payer: Self-pay

## 2016-07-20 MED ORDER — UREA 40 % EX CREA: 1.0000 g | TOPICAL_CREAM | Freq: Two times a day (BID) | CUTANEOUS | 3 refills | Status: DC

## 2016-07-20 NOTE — Progress Notes (Signed)
Spoke with pt regardign fungal culture results, advised her of Dr Theotis Burrowuchman's orders to try Urea 40%, then make an appt to re-culture in 2 months

## 2016-07-28 ENCOUNTER — Telehealth: Payer: Self-pay | Admitting: *Deleted

## 2016-07-28 NOTE — Telephone Encounter (Signed)
Pt left name, DOB, and phone number. I called and pt states she received a medication without instructions.  I reviewed Medications orders and pt was ordered Urea 40% on 07/20/2016. I reviewed 06/09/2016 LOV notes and pt had fungal culture ordered, results reviewed 07/07/2016 by Dr. Leeanne Deeduchman, he directed pt to use Urea 40% on toenails to thin for easier trimming and thinning for appearance and may want to retest in 1-2 months.  I informed pt of the instructions for the Urea and the results. Pt states understanding.

## 2017-08-03 ENCOUNTER — Other Ambulatory Visit: Payer: Self-pay | Admitting: Internal Medicine

## 2017-08-03 DIAGNOSIS — Z1231 Encounter for screening mammogram for malignant neoplasm of breast: Secondary | ICD-10-CM

## 2018-01-30 ENCOUNTER — Other Ambulatory Visit: Payer: Self-pay

## 2018-01-30 ENCOUNTER — Encounter: Payer: Self-pay | Admitting: Physician Assistant

## 2018-01-30 ENCOUNTER — Ambulatory Visit: Payer: BLUE CROSS/BLUE SHIELD | Admitting: Physician Assistant

## 2018-01-30 ENCOUNTER — Telehealth: Payer: Self-pay

## 2018-01-30 VITALS — BP 175/97 | HR 87 | Temp 99.0°F | Resp 18 | Ht 64.37 in | Wt 168.0 lb

## 2018-01-30 DIAGNOSIS — R9431 Abnormal electrocardiogram [ECG] [EKG]: Secondary | ICD-10-CM

## 2018-01-30 DIAGNOSIS — Z794 Long term (current) use of insulin: Secondary | ICD-10-CM

## 2018-01-30 DIAGNOSIS — E785 Hyperlipidemia, unspecified: Secondary | ICD-10-CM | POA: Diagnosis not present

## 2018-01-30 DIAGNOSIS — E119 Type 2 diabetes mellitus without complications: Secondary | ICD-10-CM | POA: Diagnosis not present

## 2018-01-30 DIAGNOSIS — I10 Essential (primary) hypertension: Secondary | ICD-10-CM

## 2018-01-30 LAB — POCT URINALYSIS DIP (MANUAL ENTRY)
Bilirubin, UA: NEGATIVE
Blood, UA: NEGATIVE
Glucose, UA: 500 mg/dL — AB
Ketones, POC UA: NEGATIVE mg/dL
LEUKOCYTES UA: NEGATIVE
NITRITE UA: NEGATIVE
PH UA: 6 (ref 5.0–8.0)
PROTEIN UA: NEGATIVE mg/dL
Spec Grav, UA: 1.01 (ref 1.010–1.025)
UROBILINOGEN UA: 0.2 U/dL

## 2018-01-30 LAB — POCT GLYCOSYLATED HEMOGLOBIN (HGB A1C): HEMOGLOBIN A1C: 12.6

## 2018-01-30 LAB — GLUCOSE, POCT (MANUAL RESULT ENTRY): POC Glucose: 408 mg/dl — AB (ref 70–99)

## 2018-01-30 MED ORDER — METFORMIN HCL ER 500 MG PO TB24: 2000.0000 mg | ORAL_TABLET | Freq: Every day | ORAL | 0 refills | Status: DC

## 2018-01-30 MED ORDER — METOPROLOL SUCCINATE 100 MG PO CS24: 100.0000 mg | EXTENDED_RELEASE_CAPSULE | Freq: Every day | ORAL | 0 refills | Status: DC

## 2018-01-30 MED ORDER — GLIMEPIRIDE 4 MG PO TABS: 8.0000 mg | ORAL_TABLET | Freq: Two times a day (BID) | ORAL | 0 refills | Status: DC

## 2018-01-30 MED ORDER — INSULIN GLARGINE 100 UNIT/ML ~~LOC~~ SOLN: 40.0000 [IU] | Freq: Every day | SUBCUTANEOUS | 0 refills | Status: DC

## 2018-01-30 MED ORDER — AMLODIPINE BESYLATE 10 MG PO TABS: 10.0000 mg | ORAL_TABLET | Freq: Every day | ORAL | 0 refills | Status: DC

## 2018-01-30 MED ORDER — ATORVASTATIN CALCIUM 20 MG PO TABS: 20.0000 mg | ORAL_TABLET | Freq: Every day | ORAL | 3 refills | Status: DC

## 2018-01-30 MED ORDER — GLIMEPIRIDE 4 MG PO TABS: 8.0000 mg | ORAL_TABLET | Freq: Every day | ORAL | 0 refills | Status: DC

## 2018-01-30 MED ORDER — LISINOPRIL-HYDROCHLOROTHIAZIDE 20-25 MG PO TABS: 1.0000 | ORAL_TABLET | Freq: Every day | ORAL | 0 refills | Status: DC

## 2018-01-30 NOTE — Patient Instructions (Addendum)
For diabetes, we are going to increase your metformin to 2000mg  once daily, amaryl to 4 mg twice daily, and insulin to 40 units at night time. Check fasting blood sugar. Goal is 110-120. I have placed a referral to diabetic nutrition and endocrinology, so please answer when they call within the next 2 weeks.   For elevated blood pressure, we are going to change your medication from nifedipine to amlodipine, and then add lisinopril with hctz. Continue with metoprolol. I I would like you to check your blood pressure at least a couple times over the next week outside of the office and document these values. It is best if you check the blood pressure at different times in the day. Your goal is <140/90. If your values are consistently above this goal, please return to office for further evaluation. Otherwise, return in 2 weeks for reevaluation. If you start to have chest pain, blurred vision, shortness of breath, severe headache, lower leg swelling, or nausea/vomiting please seek care immediately here or at the ED. I have placed a referral to cardiology for further evaluation of your abnormal ekg and heart murmur. Thank you for letting me participate in your health and well being.  Diabetes Mellitus and Nutrition When you have diabetes (diabetes mellitus), it is very important to have healthy eating habits because your blood sugar (glucose) levels are greatly affected by what you eat and drink. Eating healthy foods in the appropriate amounts, at about the same times every day, can help you:  Control your blood glucose.  Lower your risk of heart disease.  Improve your blood pressure.  Reach or maintain a healthy weight.  Every person with diabetes is different, and each person has different needs for a meal plan. Your health care provider may recommend that you work with a diet and nutrition specialist (dietitian) to make a meal plan that is best for you. Your meal plan may vary depending on factors such  as:  The calories you need.  The medicines you take.  Your weight.  Your blood glucose, blood pressure, and cholesterol levels.  Your activity level.  Other health conditions you have, such as heart or kidney disease.  How do carbohydrates affect me? Carbohydrates affect your blood glucose level more than any other type of food. Eating carbohydrates naturally increases the amount of glucose in your blood. Carbohydrate counting is a method for keeping track of how many carbohydrates you eat. Counting carbohydrates is important to keep your blood glucose at a healthy level, especially if you use insulin or take certain oral diabetes medicines. It is important to know how many carbohydrates you can safely have in each meal. This is different for every person. Your dietitian can help you calculate how many carbohydrates you should have at each meal and for snack. Foods that contain carbohydrates include:  Bread, cereal, rice, pasta, and crackers.  Potatoes and corn.  Peas, beans, and lentils.  Milk and yogurt.  Fruit and juice.  Desserts, such as cakes, cookies, ice cream, and candy.  How does alcohol affect me? Alcohol can cause a sudden decrease in blood glucose (hypoglycemia), especially if you use insulin or take certain oral diabetes medicines. Hypoglycemia can be a life-threatening condition. Symptoms of hypoglycemia (sleepiness, dizziness, and confusion) are similar to symptoms of having too much alcohol. If your health care provider says that alcohol is safe for you, follow these guidelines:  Limit alcohol intake to no more than 1 drink per day for nonpregnant women and  2 drinks per day for men. One drink equals 12 oz of beer, 5 oz of wine, or 1 oz of hard liquor.  Do not drink on an empty stomach.  Keep yourself hydrated with water, diet soda, or unsweetened iced tea.  Keep in mind that regular soda, juice, and other mixers may contain a lot of sugar and must be counted  as carbohydrates.  What are tips for following this plan? Reading food labels  Start by checking the serving size on the label. The amount of calories, carbohydrates, fats, and other nutrients listed on the label are based on one serving of the food. Many foods contain more than one serving per package.  Check the total grams (g) of carbohydrates in one serving. You can calculate the number of servings of carbohydrates in one serving by dividing the total carbohydrates by 15. For example, if a food has 30 g of total carbohydrates, it would be equal to 2 servings of carbohydrates.  Check the number of grams (g) of saturated and trans fats in one serving. Choose foods that have low or no amount of these fats.  Check the number of milligrams (mg) of sodium in one serving. Most people should limit total sodium intake to less than 2,300 mg per day.  Always check the nutrition information of foods labeled as "low-fat" or "nonfat". These foods may be higher in added sugar or refined carbohydrates and should be avoided.  Talk to your dietitian to identify your daily goals for nutrients listed on the label. Shopping  Avoid buying canned, premade, or processed foods. These foods tend to be high in fat, sodium, and added sugar.  Shop around the outside edge of the grocery store. This includes fresh fruits and vegetables, bulk grains, fresh meats, and fresh dairy. Cooking  Use low-heat cooking methods, such as baking, instead of high-heat cooking methods like deep frying.  Cook using healthy oils, such as olive, canola, or sunflower oil.  Avoid cooking with butter, cream, or high-fat meats. Meal planning  Eat meals and snacks regularly, preferably at the same times every day. Avoid going long periods of time without eating.  Eat foods high in fiber, such as fresh fruits, vegetables, beans, and whole grains. Talk to your dietitian about how many servings of carbohydrates you can eat at each  meal.  Eat 4-6 ounces of lean protein each day, such as lean meat, chicken, fish, eggs, or tofu. 1 ounce is equal to 1 ounce of meat, chicken, or fish, 1 egg, or 1/4 cup of tofu.  Eat some foods each day that contain healthy fats, such as avocado, nuts, seeds, and fish. Lifestyle   Check your blood glucose regularly.  Exercise at least 30 minutes 5 or more days each week, or as told by your health care provider.  Take medicines as told by your health care provider.  Do not use any products that contain nicotine or tobacco, such as cigarettes and e-cigarettes. If you need help quitting, ask your health care provider.  Work with a Veterinary surgeon or diabetes educator to identify strategies to manage stress and any emotional and social challenges. What are some questions to ask my health care provider?  Do I need to meet with a diabetes educator?  Do I need to meet with a dietitian?  What number can I call if I have questions?  When are the best times to check my blood glucose? Where to find more information:  American Diabetes Association: diabetes.org/food-and-fitness/food  Academy of Nutrition and Dietetics: https://www.vargas.com/www.eatright.org/resources/health/diseases-and-conditions/diabetes  [employer] Institute of Diabetes and Digestive and Kidney Diseases (NIH): FindJewelers.czwww.niddk.nih.gov/health-information/diabetes/overview/diet-eating-physical-activity Summary  A healthy meal plan will help you control your blood glucose and maintain a healthy lifestyle.  Working with a diet and nutrition specialist (dietitian) can help you make a meal plan that is best for you.  Keep in mind that carbohydrates and alcohol have immediate effects on your blood glucose levels. It is important to count carbohydrates and to use alcohol carefully. This information is not intended to replace advice given to you by your health care provider. Make sure you discuss any questions you have with your health care provider. Document  Released: 07/15/2005 Document Revised: 11/22/2016 Document Reviewed: 11/22/2016 Elsevier Interactive Patient Education  2018 [employer]   DASH Eating Plan DASH stands for "Dietary Approaches to Stop Hypertension." The DASH eating plan is a healthy eating plan that has been shown to reduce high blood pressure (hypertension). It may also reduce your risk for type 2 diabetes, heart disease, and stroke. The DASH eating plan may also help with weight loss. What are tips for following this plan? General guidelines  Avoid eating more than 2,300 mg (milligrams) of salt (sodium) a day. If you have hypertension, you may need to reduce your sodium intake to 1,500 mg a day.  Limit alcohol intake to no more than 1 drink a day for nonpregnant women and 2 drinks a day for men. One drink equals 12 oz of beer, 5 oz of wine, or 1 oz of hard liquor.  Work with your health care provider to maintain a healthy body weight or to lose weight. Ask what an ideal weight is for you.  Get at least 30 minutes of exercise that causes your heart to beat faster (aerobic exercise) most days of the week. Activities may include walking, swimming, or biking.  Work with your health care provider or diet and nutrition specialist (dietitian) to adjust your eating plan to your individual calorie needs. Reading food labels  Check food labels for the amount of sodium per serving. Choose foods with less than 5 percent of the Daily Value of sodium. Generally, foods with less than 300 mg of sodium per serving fit into this eating plan.  To find whole grains, look for the word "whole" as the first word in the ingredient list. Shopping  Buy products labeled as "low-sodium" or "no salt added."  Buy fresh foods. Avoid canned foods and premade or frozen meals. Cooking  Avoid adding salt when cooking. Use salt-free seasonings or herbs instead of table salt or sea salt. Check with your health care provider or pharmacist before using  salt substitutes.  Do not fry foods. Cook foods using healthy methods such as baking, boiling, grilling, and broiling instead.  Cook with heart-healthy oils, such as olive, canola, soybean, or sunflower oil. Meal planning   Eat a balanced diet that includes: ? 5 or more servings of fruits and vegetables each day. At each meal, try to fill half of your plate with fruits and vegetables. ? Up to 6-8 servings of whole grains each day. ? Less than 6 oz of lean meat, poultry, or fish each day. A 3-oz serving of meat is about the same size as a deck of cards. One egg equals 1 oz. ? 2 servings of low-fat dairy each day. ? A serving of nuts, seeds, or beans 5 times each week. ? Heart-healthy fats. Healthy fats called Omega-3 fatty acids are found in foods  such as flaxseeds and coldwater fish, like sardines, salmon, and mackerel.  Limit how much you eat of the following: ? Canned or prepackaged foods. ? Food that is high in trans fat, such as fried foods. ? Food that is high in saturated fat, such as fatty meat. ? Sweets, desserts, sugary drinks, and other foods with added sugar. ? Full-fat dairy products.  Do not salt foods before eating.  Try to eat at least 2 vegetarian meals each week.  Eat more home-cooked food and less restaurant, buffet, and fast food.  When eating at a restaurant, ask that your food be prepared with less salt or no salt, if possible. What foods are recommended? The items listed may not be a complete list. Talk with your dietitian about what dietary choices are best for you. Grains Whole-grain or whole-wheat bread. Whole-grain or whole-wheat pasta. Brown rice. Orpah Cobb. Bulgur. Whole-grain and low-sodium cereals. Pita bread. Low-fat, low-sodium crackers. Whole-wheat flour tortillas. Vegetables Fresh or frozen vegetables (raw, steamed, roasted, or grilled). Low-sodium or reduced-sodium tomato and vegetable juice. Low-sodium or reduced-sodium tomato sauce and  tomato paste. Low-sodium or reduced-sodium canned vegetables. Fruits All fresh, dried, or frozen fruit. Canned fruit in natural juice (without added sugar). Meat and other protein foods Skinless chicken or [place]. Ground chicken or [place]. Pork with fat trimmed off. Fish and seafood. Egg whites. Dried beans, peas, or lentils. Unsalted nuts, nut butters, and seeds. Unsalted canned beans. Lean cuts of beef with fat trimmed off. Low-sodium, lean deli meat. Dairy Low-fat (1%) or fat-free (skim) milk. Fat-free, low-fat, or reduced-fat cheeses. Nonfat, low-sodium ricotta or cottage cheese. Low-fat or nonfat yogurt. Low-fat, low-sodium cheese. Fats and oils Soft margarine without trans fats. Vegetable oil. Low-fat, reduced-fat, or light mayonnaise and salad dressings (reduced-sodium). Canola, safflower, olive, soybean, and sunflower oils. Avocado. Seasoning and other foods Herbs. Spices. Seasoning mixes without salt. Unsalted popcorn and pretzels. Fat-free sweets. What foods are not recommended? The items listed may not be a complete list. Talk with your dietitian about what dietary choices are best for you. Grains Baked goods made with fat, such as croissants, muffins, or some breads. Dry pasta or rice meal packs. Vegetables Creamed or fried vegetables. Vegetables in a cheese sauce. Regular canned vegetables (not low-sodium or reduced-sodium). Regular canned tomato sauce and paste (not low-sodium or reduced-sodium). Regular tomato and vegetable juice (not low-sodium or reduced-sodium). Rosita Fire. Olives. Fruits Canned fruit in a light or heavy syrup. Fried fruit. Fruit in cream or butter sauce. Meat and other protein foods Fatty cuts of meat. Ribs. Fried meat. Tomasa Blase. Sausage. Bologna and other processed lunch meats. Salami. Fatback. Hotdogs. Bratwurst. Salted nuts and seeds. Canned beans with added salt. Canned or smoked fish. Whole eggs or egg yolks. Chicken or [place] with skin. Dairy Whole or 2%  milk, cream, and half-and-half. Whole or full-fat cream cheese. Whole-fat or sweetened yogurt. Full-fat cheese. Nondairy creamers. Whipped toppings. Processed cheese and cheese spreads. Fats and oils Butter. Stick margarine. Lard. Shortening. Ghee. Bacon fat. Tropical oils, such as coconut, palm kernel, or palm oil. Seasoning and other foods Salted popcorn and pretzels. Onion salt, garlic salt, seasoned salt, table salt, and sea salt. Worcestershire sauce. Tartar sauce. Barbecue sauce. Teriyaki sauce. Soy sauce, including reduced-sodium. Steak sauce. Canned and packaged gravies. Fish sauce. Oyster sauce. Cocktail sauce. Horseradish that you find on the shelf. Ketchup. Mustard. Meat flavorings and tenderizers. Bouillon cubes. Hot sauce and Tabasco sauce. Premade or packaged marinades. Premade or packaged taco seasonings. Relishes. Regular salad  dressings. Where to find more information:  National Heart, Lung, and Blood Institute: PopSteam.is  American Heart Association: www.heart.org Summary  The DASH eating plan is a healthy eating plan that has been shown to reduce high blood pressure (hypertension). It may also reduce your risk for type 2 diabetes, heart disease, and stroke.  With the DASH eating plan, you should limit salt (sodium) intake to 2,300 mg a day. If you have hypertension, you may need to reduce your sodium intake to 1,500 mg a day.  When on the DASH eating plan, aim to eat more fresh fruits and vegetables, whole grains, lean proteins, low-fat dairy, and heart-healthy fats.  Work with your health care provider or diet and nutrition specialist (dietitian) to adjust your eating plan to your individual calorie needs. This information is not intended to replace advice given to you by your health care provider. Make sure you discuss any questions you have with your health care provider. Document Released: 10/07/2011 Document Revised: 10/11/2016 Document Reviewed:  10/11/2016 Elsevier Interactive Patient Education  2018 [employer].  IF you received an x-ray today, you will receive an invoice from [facility]. Please contact [facility] at [phone] with questions or concerns regarding your invoice.   IF you received labwork today, you will receive an invoice from [facility]. Please contact [facility] at [phone] with questions or concerns regarding your invoice.   Our billing staff will not be able to assist you with questions regarding bills from these companies.  You will be contacted with the lab results as soon as they are available. The fastest way to get your results is to activate your My Chart account. Instructions are located on the last page of this paperwork. If you have not heard from [place] regarding the results in 2 weeks, please contact this office.

## 2018-01-30 NOTE — Progress Notes (Signed)
MRN: [id number]  Subjective:   Yolanda Herrera is a 47 y.o. female who presents for chief complaint of medication refill for chronic medical conditions. Wants to find a new PCP because last doctor would not give refills on time? Has been out of some medication for the past week.   1) Type 2 diabetes mellitus. Diagnosis was made ~2012. Patient is currently managed with metformin 1071m daily, amaryl 480mdaily, and lantus 25U in the morning. Admits good compliance. Denies adverse effects including metallic taste, hypoglycemia, nausea, vomiting. Patient is checking home blood sugars. Home blood sugar records: BGs range between 200 and 370. Current symptoms include none. Patient denies foot ulcerations, increased appetite, nausea, paresthesia of the feet, polydipsia, polyuria, visual disturbances and vomiting. Patient is not checking their feet daily. No foot concerns. Last diabetic eye exam eye exam 06/2017, it was normal.  Diet: breakfast includes toast, eggs, lunch is whatever is at work, and dinner is rice. Drinks water.  Exercise includes [employer] twice a week. Denies smoking or alcohol use. Known diabetic complications: none. Last A1C was 11.   2) HTN: Currently managed with nifedipine 605maily, hctz 25 mg daily, and metoprolol 100m93mily.  Takes potassium because she was told in the past she has low potassium. She has been out of this. Patient is checking blood pressure at home, range is 140 768tolic.  Denies lightheadedness, dizziness, chronic headache, double vision, chest pain, shortness of breath, heart racing, palpitations, nausea, vomiting, abdominal pain, hematuria, lower leg swelling. Denies excess salt intake.   3) HLD: Controlled on lipitor 10mg66mly.    Objective:   PHYSICAL EXAM BP (!) 175/97   Pulse 87   Temp 99 F (37.2 C) (Oral)   Resp 18   Ht 5' 4.37" (1.635 m)   Wt 168 lb (76.2 kg)   LMP 01/18/2018 (Approximate)   SpO2 98%   BMI 28.51 kg/m   Physical  Exam  Constitutional: She is oriented to person, place, and time. She appears well-developed and well-nourished.  HENT:  Head: Normocephalic and atraumatic.  Right Ear: Tympanic membrane, external ear and ear canal normal.  Left Ear: Tympanic membrane, external ear and ear canal normal.  Nose: Nose normal.  Mouth/Throat: Uvula is midline, oropharynx is clear and moist and mucous membranes are normal. No tonsillar exudate.  Eyes: Pupils are equal, round, and reactive to light. Conjunctivae and EOM are normal.  Neck: Normal range of motion.  Cardiovascular: Normal rate and regular rhythm.  Murmur heard.  Systolic (LUSB) murmur is present. Pulses:      Radial pulses are 2+ on the right side, and 2+ on the left side.       Posterior tibial pulses are 2+ on the right side, and 2+ on the left side.  Pulmonary/Chest: Effort normal and breath sounds normal. She has no decreased breath sounds. She has no wheezes. She has no rhonchi. She has no rales.  Neurological: She is alert and oriented to person, place, and time.  Skin: Skin is warm and dry.  Psychiatric: She has a normal mood and affect.  Vitals reviewed.  Results for orders placed or performed in visit on 01/30/18 (from the past 24 hour(s))  POCT glucose (manual entry)     Status: Abnormal   Collection Time: 01/30/18 11:08 AM  Result Value Ref Range   POC Glucose 408 (A) 70 - 99 mg/dl  POCT glycosylated hemoglobin (Hb A1C)     Status: None   Collection Time:  01/30/18 11:10 AM  Result Value Ref Range   Hemoglobin A1C 12.6   POCT urinalysis dipstick     Status: Abnormal   Collection Time: 01/30/18 11:20 AM  Result Value Ref Range   Color, UA yellow yellow   Clarity, UA clear clear   Glucose, UA =500 (A) negative mg/dL   Bilirubin, UA negative negative   Ketones, POC UA negative negative mg/dL   Spec Grav, UA 1.010 1.010 - 1.025   Blood, UA negative negative   pH, UA 6.0 5.0 - 8.0   Protein Ur, POC negative negative mg/dL    Urobilinogen, UA 0.2 0.2 or 1.0 E.U./dL   Nitrite, UA Negative Negative   Leukocytes, UA Negative Negative    EKG shows sinus rhythm with rate of 72 bpm. PR and QRS intervals within normal limits.LVH noted. Negative T waves in leads I, II, III, AVF, and V6. Q waves in V1 and V2. Prior ekg from 2010 is in care everywhere, cannot view image, but EKG read states sinus rhythm with sinus arrhythmia and atrial enlargement.  Findings discussed with Dr. Tamala Julian.   Assessment and Plan :  This case was precepted with Dr. Tamala Julian.  1. Type 2 diabetes mellitus without complication, with long-term current use of insulin (HCC) Very uncontrolled at this time.  Point-of-care glucose 408.  A1c 12.6.  Glucosuria noted.  No ketones noted. She is asymptomatic.  Had a long discussion with patient regarding potential complications of uncontrolled diabetes.  Discussed dietary and lifestyle modifications.  Recommend she is seeing diabetic nutrition and endocrinology.  In the meantime, will increase metformin to 2000 mg daily, Amaryl to 8 mg daily and insulin to 40 units at bedtime. Encouraged to check fasting glucose daily.  Goal is 110-120.  Return here in 2 weeks for reevaluation. - POCT glucose (manual entry) - POCT glycosylated hemoglobin (Hb A1C) - POCT urinalysis dipstick - CBC with Differential/Platelet - CMP14+EGFR - TSH - Microalbumin/Creatinine Ratio, Urine - HM Diabetes Foot Exam - Ambulatory referral to diabetic education - Ambulatory referral to Endocrinology - metFORMIN (GLUCOPHAGE-XR) 500 MG 24 hr tablet; Take 4 tablets (2,000 mg total) by mouth at bedtime.  Dispense: 120 tablet; Refill: 0 - insulin glargine (LANTUS) 100 UNIT/ML injection; Inject 0.4 mLs (40 Units total) into the skin daily.  Dispense: 10 mL; Refill: 0 - glimepiride (AMARYL) 4 MG tablet; Take 2 tablets (8 mg total) by mouth daily.  Dispense: 60 tablet; Refill: 0  2. Essential hypertension Uncontrolled.  Patient is asymptomatic.  EKG  is abnormal.  Patient warrants further evaluation from cardiologist for stress test.  Will add lisinopril to medication regimen and change nifedipine to amlodipine.  Instructed to check bp outside of office over the next couple of weeks. Given strict ED precautions.  Return in 2 weeks for reevaluation. Will hold off on refill of potassium until labs return.  - EKG 12-Lead - Metoprolol Succinate 100 MG CS24; Take 100 mg by mouth daily.  Dispense: 30 capsule; Refill: 0 - lisinopril-hydrochlorothiazide (PRINZIDE,ZESTORETIC) 20-25 MG tablet; Take 1 tablet by mouth daily.  Dispense: 30 tablet; Refill: 0 - amLODipine (NORVASC) 10 MG tablet; Take 1 tablet (10 mg total) by mouth daily.  Dispense: 30 tablet; Refill: 0 - Ambulatory referral to Cardiology  3. Abnormal EKG - Ambulatory referral to Cardiology  4. Hyperlipidemia, unspecified hyperlipidemia type Increase Lipitor dose from 10 to 20 mg. - Lipid panel - atorvastatin (LIPITOR) 20 MG tablet; Take 1 tablet (20 mg total) by mouth daily.  Dispense:  90 tablet; Refill: 3 - Ambulatory referral to Cardiologya  A total of 45 minutes was spent in the room with the patient, greater than 50% of which was in counseling/coordination of care regarding uncontrolled T2DM and HTN.  Tenna Delaine, PA-C  [facility] 01/30/2018 11:29 AM

## 2018-01-30 NOTE — Telephone Encounter (Signed)
Spoke with pharmacist at [facility] an advised per Yolanda Herrera pt cancel rx sent over for amaryl 4 mg ( 8 mg total) bid.  Advised to cancel and she resent amaryl 4 mg ( total of 8 mg) once daily.  Agreeable and will cancel first rx sent over. Dgaddy, CMA

## 2018-01-31 LAB — CMP14+EGFR
ALT: 32 IU/L (ref 0–32)
AST: 22 IU/L (ref 0–40)
Albumin/Globulin Ratio: 1.5 (ref 1.2–2.2)
Albumin: 4.5 g/dL (ref 3.5–5.5)
Alkaline Phosphatase: 75 IU/L (ref 39–117)
BUN/Creatinine Ratio: 10 (ref 9–23)
BUN: 9 mg/dL (ref 6–24)
Bilirubin Total: <0.2 mg/dL (ref 0.0–1.2)
CALCIUM: 9.3 mg/dL (ref 8.7–10.2)
CHLORIDE: 97 mmol/L (ref 96–106)
CO2: 21 mmol/L (ref 20–29)
Creatinine, Ser: 0.92 mg/dL (ref 0.57–1.00)
GFR, EST AFRICAN AMERICAN: 86 mL/min/{1.73_m2} (ref >59)
GFR, EST NON AFRICAN AMERICAN: 74 mL/min/{1.73_m2} (ref >59)
GLUCOSE: 375 mg/dL — AB (ref 65–99)
Globulin, Total: 3 g/dL (ref 1.5–4.5)
Potassium: 3.5 mmol/L (ref 3.5–5.2)
Sodium: 145 mmol/L — ABNORMAL HIGH (ref 134–144)
TOTAL PROTEIN: 7.5 g/dL (ref 6.0–8.5)

## 2018-01-31 LAB — CBC WITH DIFFERENTIAL/PLATELET
Basophils Absolute: 0 x10E3/uL (ref 0.0–0.2)
Basos: 0 %
EOS (ABSOLUTE): 0.1 x10E3/uL (ref 0.0–0.4)
Eos: 1 %
Hematocrit: 43.1 % (ref 34.0–46.6)
Hemoglobin: 13.7 g/dL (ref 11.1–15.9)
Immature Grans (Abs): 0 x10E3/uL (ref 0.0–0.1)
Immature Granulocytes: 0 %
Lymphocytes Absolute: 2.3 x10E3/uL (ref 0.7–3.1)
Lymphs: 36 %
MCH: 24.3 pg — ABNORMAL LOW (ref 26.6–33.0)
MCHC: 31.8 g/dL (ref 31.5–35.7)
MCV: 77 fL — ABNORMAL LOW (ref 79–97)
Monocytes Absolute: 0.3 x10E3/uL (ref 0.1–0.9)
Monocytes: 5 %
Neutrophils Absolute: 3.6 x10E3/uL (ref 1.4–7.0)
Neutrophils: 58 %
Platelets: 234 x10E3/uL (ref 150–379)
RBC: 5.63 x10E6/uL — ABNORMAL HIGH (ref 3.77–5.28)
RDW: 15.5 % — ABNORMAL HIGH (ref 12.3–15.4)
WBC: 6.3 x10E3/uL (ref 3.4–10.8)

## 2018-01-31 LAB — TSH: TSH: 0.985 u[IU]/mL (ref 0.450–4.500)

## 2018-01-31 LAB — LIPID PANEL
CHOL/HDL RATIO: 4 ratio (ref 0.0–4.4)
Cholesterol, Total: 171 mg/dL (ref 100–199)
HDL: 43 mg/dL (ref >39)
LDL Calculated: 101 mg/dL — ABNORMAL HIGH (ref 0–99)
TRIGLYCERIDES: 133 mg/dL (ref 0–149)
VLDL CHOLESTEROL CAL: 27 mg/dL (ref 5–40)

## 2018-01-31 LAB — MICROALBUMIN / CREATININE URINE RATIO
Creatinine, Urine: 47.6 mg/dL
Microalb/Creat Ratio: 32.4 mg/g{creat} — ABNORMAL HIGH (ref 0.0–30.0)
Microalbumin, Urine: 15.4 ug/mL

## 2018-02-01 ENCOUNTER — Encounter: Payer: Self-pay | Admitting: Radiology

## 2018-02-01 DIAGNOSIS — I10 Essential (primary) hypertension: Secondary | ICD-10-CM | POA: Insufficient documentation

## 2018-02-01 DIAGNOSIS — E119 Type 2 diabetes mellitus without complications: Secondary | ICD-10-CM | POA: Insufficient documentation

## 2018-02-01 DIAGNOSIS — Z794 Long term (current) use of insulin: Principal | ICD-10-CM

## 2018-02-01 DIAGNOSIS — E785 Hyperlipidemia, unspecified: Secondary | ICD-10-CM | POA: Insufficient documentation

## 2018-02-01 NOTE — Addendum Note (Signed)
Addended by: Benjiman CoreWISEMAN, Norelle Runnion D on: 02/01/2018 04:37 PM   Modules accepted: Level of Service

## 2018-02-15 ENCOUNTER — Encounter: Payer: Self-pay | Admitting: Physician Assistant

## 2018-02-15 ENCOUNTER — Ambulatory Visit: Payer: BLUE CROSS/BLUE SHIELD | Admitting: Physician Assistant

## 2018-02-15 ENCOUNTER — Other Ambulatory Visit: Payer: Self-pay

## 2018-02-15 VITALS — BP 157/87 | HR 60 | Temp 98.3°F | Resp 18 | Ht 64.09 in | Wt 167.4 lb

## 2018-02-15 DIAGNOSIS — E119 Type 2 diabetes mellitus without complications: Secondary | ICD-10-CM | POA: Diagnosis not present

## 2018-02-15 DIAGNOSIS — Z794 Long term (current) use of insulin: Secondary | ICD-10-CM

## 2018-02-15 DIAGNOSIS — I10 Essential (primary) hypertension: Secondary | ICD-10-CM

## 2018-02-15 LAB — POCT URINALYSIS DIP (MANUAL ENTRY)
BILIRUBIN UA: NEGATIVE
BILIRUBIN UA: NEGATIVE mg/dL
Blood, UA: NEGATIVE
GLUCOSE UA: NEGATIVE mg/dL
LEUKOCYTES UA: NEGATIVE
Nitrite, UA: NEGATIVE
PROTEIN UA: NEGATIVE mg/dL
Spec Grav, UA: 1.02 (ref 1.010–1.025)
Urobilinogen, UA: 0.2 E.U./dL
pH, UA: 7 (ref 5.0–8.0)

## 2018-02-15 LAB — GLUCOSE, POCT (MANUAL RESULT ENTRY): POC Glucose: 112 mg/dl — AB (ref 70–99)

## 2018-02-15 MED ORDER — EMPAGLIFLOZIN 10 MG PO TABS
10.0000 mg | ORAL_TABLET | Freq: Every day | ORAL | 0 refills | Status: DC
Start: 2018-02-15 — End: 2018-02-27

## 2018-02-15 MED ORDER — LIRAGLUTIDE 18 MG/3ML ~~LOC~~ SOPN: PEN_INJECTOR | SUBCUTANEOUS | 0 refills | Status: DC

## 2018-02-15 MED ORDER — LIRAGLUTIDE 18 MG/3ML ~~LOC~~ SOPN: 0.6000 mg | PEN_INJECTOR | SUBCUTANEOUS | 0 refills | Status: DC

## 2018-02-15 NOTE — Progress Notes (Signed)
MRN: [id number] DOB: 10/13/1971  Subjective:   Yolanda Herrera is a 47 y.o. female presenting for follow up on uncontrolled T2DM and HTN. Pt seen 2 weeks ago. Meds were increased/added for both conditions. Please see that OV note for additional details.    T2DM: Taking Lantus 38 units, metformin 2000 mg twice daily, and glimeperide 8 mg daily.. She is checking sugars. Blood sugars ranging 114-198. Patient denies foot ulcerations, increased appetite, nausea, paresthesia of the feet, polydipsia, polyuria, visual disturbances and vomiting.   HTN: Taking amlodipine 10 mg daily, lisinopril-HCTZ 20-25 mg daily, and metoprolol 100 mg daily.  Patient is checking blood pressure at home, range is 140s systolic. Reports some occasional tension type headaches, which will last a few hours and resolved.  She is drinking about 40 ounces of water daily. Getting about 4 hours of sleep. Denies lightheadedness, dizziness,  double vision, confusion, chest pain, shortness of breath, heart racing, palpitations, nausea, vomiting, abdominal pain, hematuria, lower leg swelling. Lifestyle: Avoiding excessive salt intake.  Has decreased rice intake.   In terms of placed at last visit, she has not heard from endocrinology.  Has appointment with cardiology 5/11 and an appointment with diabetic nutrition on 4/24.   Yolanda Herrera has a current medication list which includes the following prescription(s): amlodipine, atorvastatin, glimepiride, insulin glargine, lisinopril-hydrochlorothiazide, metformin, metoprolol succinate, ubiquinone, and potassium chloride sa. Also has No Known Allergies.  Yolanda Herrera  has a past medical history of Diabetes mellitus without complication (HCC) and Hypertension. Also  has a past surgical history that includes Uterine fibroid surgery and Cesarean section.   Objective:   Vitals: BP (!) 160/90 (BP Location: Right Arm, Patient Position: Sitting, Cuff Size: Normal)   Pulse 67   Temp 98.3 F (36.8 C)  (Oral)   Resp 18   Ht 5' 4.09" (1.628 m)   Wt 167 lb 6.4 oz (75.9 kg)   LMP 01/18/2018 (Approximate)   SpO2 98%   BMI 28.65 kg/m   Physical Exam  Constitutional: She is oriented to person, place, and time. She appears well-developed and well-nourished.  HENT:  Head: Normocephalic and atraumatic.  Mouth/Throat: Uvula is midline, oropharynx is clear and moist and mucous membranes are normal. No tonsillar exudate.  No pain with palpation of temporal regions bilaterally.  Eyes: Pupils are equal, round, and reactive to light. Conjunctivae and EOM are normal.  Fundoscopic exam:      The right eye shows no hemorrhage and no papilledema. The right eye shows red reflex.       The left eye shows no hemorrhage and no papilledema. The left eye shows red reflex.  Neck: Normal range of motion and full passive range of motion without pain. No Brudzinski's sign and no Kernig's sign noted.  Cardiovascular: Normal rate and regular rhythm.  Murmur heard.  Systolic murmur is present. Pulmonary/Chest: Effort normal and breath sounds normal.  Neurological: She is alert and oriented to person, place, and time. She has normal strength and normal reflexes. No cranial nerve deficit or sensory deficit. She displays a negative Romberg sign. Gait normal.  Normal tandem walk.   Skin: Skin is warm and dry.  Psychiatric: She has a normal mood and affect.  Vitals reviewed.   Results for orders placed or performed in visit on 02/15/18 (from the past 24 hour(s))  POCT urinalysis dipstick     Status: None   Collection Time: 02/15/18 10:24 AM  Result Value Ref Range   Color, UA yellow yellow  Clarity, UA clear clear   Glucose, UA negative negative mg/dL   Bilirubin, UA negative negative   Ketones, POC UA negative negative mg/dL   Spec Grav, UA 9.147 8.295 - 1.025   Blood, UA negative negative   pH, UA 7.0 5.0 - 8.0   Protein Ur, POC negative negative mg/dL   Urobilinogen, UA 0.2 0.2 or 1.0 E.U./dL    Nitrite, UA Negative Negative   Leukocytes, UA Negative Negative  POCT glucose (manual entry)     Status: Abnormal   Collection Time: 02/15/18 10:24 AM  Result Value Ref Range   POC Glucose 112 (A) 70 - 99 mg/dl    BP Readings from Last 3 Encounters:  02/15/18 (!) 160/90  01/30/18 (!) 175/97  06/09/16 (!) 175/104    Assessment and Plan :  1. Type 2 diabetes mellitus without complication, with long-term current use of insulin (HCC) Improving.POC glucose today 112. Glucosiura resolved.  Will d/c glimepiride to risk of hypoglycemia and began jardiance today, along with continued lantus and metformin. Follow up in office in 2 weeks. If tolerating jardiance okay, can prescribed combined tablet with jardiance and metformin.  - POCT urinalysis dipstick - POCT glucose (manual entry) - empagliflozin (JARDIANCE) 10 MG TABS tablet; Take 10 mg by mouth daily.  Dispense: 30 tablet; Refill: 0  2. Essential hypertension Improving since last visit. Still uncontrolled.  Home recordings have been in the 140s systolically.  Will give more time for HTN medication to take full effect. Instructed to follow up in 2 weeks for reevaluation. Bring home BP cuff to next visit so we can compare it to an office values. Given strict ED precautions.   Benjiman Core, PA-C  [facility] at [facility] 02/15/2018 10:28 AM

## 2018-02-15 NOTE — Patient Instructions (Addendum)
For diabetes, I would like you to discontinue glimepiride. Continue with metformin and insulin. Start jardiance daily. Continue monitoring her fasting blood glucose.  Goal is 110-120. Follow up in 2 weeks for reevalation.   For high blood pressure continue with current medication.  Bring your blood pressure cuff to next visit.  Please return over 2 weeks for reevaluation.  Start increasing water consumption to at least 60 ounces daily and try to get 6-8 hours of sleep at night.   IF you received an x-ray today, you will receive an invoice from [facility]. Please contact [facility] at [phone] with questions or concerns regarding your invoice.   IF you received labwork today, you will receive an invoice from [facility]. Please contact [facility] at [phone] with questions or concerns regarding your invoice.   Our billing staff will not be able to assist you with questions regarding bills from these companies.  You will be contacted with the lab results as soon as they are available. The fastest way to get your results is to activate your My Chart account. Instructions are located on the last page of this paperwork. If you have not heard from us regarding the results in 2 weeks, please contact this office.

## 2018-02-21 ENCOUNTER — Encounter: Payer: Self-pay | Admitting: Physician Assistant

## 2018-02-21 NOTE — Telephone Encounter (Signed)
A user error has taken place: encounter opened in error, closed for administrative reasons.

## 2018-02-22 ENCOUNTER — Telehealth: Payer: Self-pay | Admitting: Physician Assistant

## 2018-02-22 ENCOUNTER — Encounter: Payer: Self-pay | Admitting: Registered"

## 2018-02-22 ENCOUNTER — Encounter: Payer: BLUE CROSS/BLUE SHIELD | Attending: Physician Assistant | Admitting: Registered"

## 2018-02-22 DIAGNOSIS — Z713 Dietary counseling and surveillance: Secondary | ICD-10-CM | POA: Diagnosis present

## 2018-02-22 DIAGNOSIS — E119 Type 2 diabetes mellitus without complications: Secondary | ICD-10-CM | POA: Diagnosis not present

## 2018-02-22 DIAGNOSIS — Z794 Long term (current) use of insulin: Secondary | ICD-10-CM | POA: Diagnosis not present

## 2018-02-22 NOTE — Patient Instructions (Signed)
Aim to eat balanced meals and snacks. Consider having structured meals times and not going too long without eating. Aim to have protein at each meal and snack. Read the nutrition facts label on the digestive cookies to know how much carbs and protein is in them and aim to keep to about 15 grams carb and include some protein with them. Continue checking your fasting bloods sugar and consider checking 2 hrs after a few different meals. When having low blood sugar symptoms consider checking and if below 70 treat with fast acting carbs, 1/2 cup, wait 15 min can retreat if still low. Then have some protein to level off blood sugar.  Check with your doctor about timing of meformin 1 time per day versus 2 times per day?

## 2018-02-22 NOTE — Telephone Encounter (Signed)
Copied from CRM [phone]. Topic: Quick Communication - See Telephone Encounter >> Feb 22, 2018  4:11 PM Cipriano BunkerLambe, Annette S wrote: CRM for notification.   Gregary SignsSean from [facility] [phone] asking about victoza asking if she has tried any other drugs.  Step therapy  or need statement from provider if not been effective.  Fax was sent on 4/22   See Telephone encounter for: 02/22/18.

## 2018-02-22 NOTE — Progress Notes (Signed)
Diabetes Self-Management Education  Visit Type: First/Initial  Appt. Start Time: 1415 Appt. End Time: 1545  02/22/2018  Ms. Thornell Mule, identified by name and date of birth, is a 47 y.o. female with a diagnosis of Diabetes: Type 2.   ASSESSMENT Patient states since she started with new doctor a few weeks ago and had adjustments to her medications, her FBG has come down from 400 to averaging in the low 100s. Pt states she often has low blood sugar symptoms and this morning felt dizzy with a FBG of 93 mg/dL.Patient states she checks her BG before bed, which is about 3-4 hours after eating, ranges 170-212.  Patient states when she was bumped up to 40 units it initially made her stomach turn, and she reduced to 35 u but is planning to work back up to 40 u.   Patient states she believes her rice consumption is probably the issue with the BG. Patient states she had started eating more plantains because she thought they would be better for blood sugar control than rice. Patient states she eats digestive cookies occasionally for constipation. RD was not able to find nutrition facts for this product.   Patient states she is has had some depression recently but it is getting better. Patient reports last month was the 1 yr anniversary of her dad's death and while dealing with the grief of the anniversary, her sister had a stroke and now is in rehab and slowly recovering. Patient states she does not have any other family in the states, her mother went back to [place] when her father died last year and she has not seen her since. Patient states she has social support through her church and friends and RD encouraged her to reach out to them.  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 Patient states she does not take a lunch break and if she eats lunch will do it while she is passing out meds. 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 Patient is anticipating getting 4-5 hrs sleep on this schedule.    Diabetes Self-Management Education - 02/22/18 1434      Visit Information   Visit Type  First/Initial      Initial Visit   Diabetes Type  Type 2    Are you currently following a meal plan?  No    Are you taking your medications as prescribed?  Yes    Date Diagnosed  2012      Health Coping   How would you rate your overall health?  Poor      Psychosocial Assessment   Patient Belief/Attitude about Diabetes  Other (comment) taking seriously    How often do you need to have someone help you when you read instructions, pamphlets, or other written materials from your doctor or pharmacy?  1 - Never    What is the last grade level you completed in school?  12th currently enrolled in LPN course      Complications   Last HgB A1C per patient/outside source  12.6 %    How often do you check your blood sugar?  1-2 times/day    Fasting Blood glucose range (mg/dL)  -- 96-045    Number of hypoglycemic episodes per month  0    Have you had a dilated eye exam in the past 12 months?  Yes    Have you had a dental exam in the past 12 months?  Yes    Are you checking your feet?  No      Dietary Intake   Breakfast  2 eggs, toast, apples OR PB&J, apple, coffee with splenda cream    Lunch  none ~3x week OR fish, potatoes, vegetables OR chicken salad sandwich wheet bread    Dinner  spinach stew with either rice OR plantain    Snack (evening)  bread PB&J OR apples & PB OR 1x week [employer] digestive cookies to relieve constipation    Beverage(s)  water, hot tea      Exercise   Exercise Type  Light (walking / raking leaves)    How many days per week to you exercise?  2    How many minutes per day do you exercise?  90    Total minutes per week of exercise  180      Patient Education   Previous Diabetes Education  Yes (please comment) GDM 8 yrs ago    Nutrition management    Role of diet in the treatment of diabetes and the relationship between the three main macronutrients and blood glucose level;Carbohydrate counting;Food label reading, portion sizes and measuring food.    Physical activity and exercise   Role of exercise on diabetes management, blood pressure control and cardiac health.    Monitoring  Identified appropriate SMBG and/or A1C goals.    Acute complications  Taught treatment of hypoglycemia - the 15 rule.    Psychosocial adjustment  Role of stress on diabetes      Individualized Goals (developed by patient)   Nutrition  General guidelines for healthy choices and portions discussed    Physical Activity  Exercise 1-2 times per week    Monitoring   test my blood glucose as discussed      Outcomes   Expected Outcomes  Demonstrated interest in learning. Expect positive outcomes    Future DMSE  PRN    Program Status  Completed      Individualized Plan for Diabetes Self-Management Training:   Learning Objective:  Patient will have a greater understanding of diabetes self-management. Patient education plan is to attend individual and/or group sessions per assessed needs and concerns.   Patient Instructions  Aim to eat balanced meals and snacks. Consider having structured meals times and not going too long without eating. Aim to have protein at each meal and snack. Read the nutrition facts label on the digestive cookies to know how much carbs and protein is in them and aim to keep to about 15 grams carb and include some protein with them. Continue checking your fasting bloods sugar and consider checking 2 hrs after a few different meals. When having low blood sugar symptoms consider checking and if below 70 treat with fast acting carbs, 1/2 cup, wait 15 min can retreat if still low. Then have some protein to level off blood sugar.  Check with your doctor about timing of meformin 1 time per day versus 2 times per day?    Expected Outcomes:   Demonstrated interest in learning. Expect positive outcomes  Education material provided: A1C conversion sheet and My Plate, ADA Diabetes: Your Take Control Guide  If problems or questions, patient to contact team via:  Phone  Future DSME appointment: PRN

## 2018-02-22 NOTE — Telephone Encounter (Signed)
Please call pharmacy and discontinue Victoza.  This was initially prescribed but then discontinued.  She is taking Jardiance instead.  (Along with Lantus and metformin) Please call patient and make sure she understands which medication she should be taking for diabetes.

## 2018-02-27 ENCOUNTER — Telehealth: Payer: Self-pay | Admitting: *Deleted

## 2018-02-27 ENCOUNTER — Ambulatory Visit: Payer: BLUE CROSS/BLUE SHIELD | Admitting: Physician Assistant

## 2018-02-27 ENCOUNTER — Encounter: Payer: Self-pay | Admitting: Physician Assistant

## 2018-02-27 ENCOUNTER — Other Ambulatory Visit: Payer: Self-pay

## 2018-02-27 VITALS — BP 171/103 | HR 78 | Temp 98.8°F | Resp 18 | Ht 64.17 in | Wt 167.2 lb

## 2018-02-27 DIAGNOSIS — E785 Hyperlipidemia, unspecified: Secondary | ICD-10-CM

## 2018-02-27 DIAGNOSIS — Z7189 Other specified counseling: Secondary | ICD-10-CM | POA: Diagnosis not present

## 2018-02-27 DIAGNOSIS — Z114 Encounter for screening for human immunodeficiency virus [HIV]: Secondary | ICD-10-CM

## 2018-02-27 DIAGNOSIS — Z124 Encounter for screening for malignant neoplasm of cervix: Secondary | ICD-10-CM | POA: Diagnosis not present

## 2018-02-27 DIAGNOSIS — I10 Essential (primary) hypertension: Secondary | ICD-10-CM

## 2018-02-27 DIAGNOSIS — E119 Type 2 diabetes mellitus without complications: Secondary | ICD-10-CM | POA: Diagnosis not present

## 2018-02-27 DIAGNOSIS — Z7185 Encounter for immunization safety counseling: Secondary | ICD-10-CM

## 2018-02-27 DIAGNOSIS — Z0000 Encounter for general adult medical examination without abnormal findings: Secondary | ICD-10-CM

## 2018-02-27 DIAGNOSIS — Z794 Long term (current) use of insulin: Secondary | ICD-10-CM | POA: Diagnosis not present

## 2018-02-27 MED ORDER — EMPAGLIFLOZIN-METFORMIN HCL ER 5-1000 MG PO TB24: 1.0000 | ORAL_TABLET | Freq: Two times a day (BID) | ORAL | 0 refills | Status: DC

## 2018-02-27 MED ORDER — ATORVASTATIN CALCIUM 20 MG PO TABS: 20.0000 mg | ORAL_TABLET | Freq: Every day | ORAL | 3 refills | Status: DC

## 2018-02-27 MED ORDER — METOPROLOL SUCCINATE 100 MG PO CS24: 100.0000 mg | EXTENDED_RELEASE_CAPSULE | Freq: Every day | ORAL | 0 refills | Status: DC

## 2018-02-27 MED ORDER — LISINOPRIL-HYDROCHLOROTHIAZIDE 20-25 MG PO TABS: 1.0000 | ORAL_TABLET | Freq: Every day | ORAL | 0 refills | Status: DC

## 2018-02-27 MED ORDER — AMLODIPINE BESYLATE 10 MG PO TABS: 10.0000 mg | ORAL_TABLET | Freq: Every day | ORAL | 0 refills | Status: DC

## 2018-02-27 MED ORDER — INSULIN GLARGINE 100 UNIT/ML ~~LOC~~ SOLN: 40.0000 [IU] | Freq: Every day | SUBCUTANEOUS | 2 refills | Status: DC

## 2018-02-27 NOTE — Progress Notes (Signed)
Yolanda Herrera  MRN: [id number] DOB: 12/06/1970  Subjective:  Pt  is a 47 y.o. female who presents for annual physical exam. 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 She is feeling well today. Has never tested positive for TB. Pt is fasting today.   Diet: Saw diabetic nutritionist and notes it really helped. Biggest insight was she was eating plantains because she thought they were healthy but learned they were not. She is also working on portion control.    Exercise: Once a week, goes to [employer], stays 1.5-2 hours, does weights and treadmill.   BM: Daily  Sleep: 4 hours  Menstrual cycles: irregular x 4 months. Has them every couple months, last a few days.   Last dental exam: 2018 Last vision exam: 2018, wears rx eyeglasses Last pap smear: 7 years ago Last mammogram: 2018, benign mass, had bx  Vaccinations      Tetanus:2011      Influenza: 2018      Does not know if she is up to date on all immunizations but does report she tested positive to titers of some things at one time, does not have results with her today.       Chronic medical conditions: 1)T2DM: Last visit on 02/15/2018, glimepiride discontinued and Jardiance 54m was added.  Patient notes she is tolerating Jardiance well.  She is continued metformin 20022mdaily and lantus 38 units qhs.Fasting blood sugars have been running in the 120s.Met with diabetic nutritionist.   2) HTN: Taking amlodipine 10 mg, lisinopril-HCTZ 20-25 mg, and metoprolol 100 mg daily.  She is checking her blood pressure at home and notes it is running in the 14426Systolically.  Notes she checked it this morning and it was < 140/90.  She thinks that coming to the office increases her blood pressure.  She is asymptomatic.  Referral to cardiology was placed on 01/30/2018 visit.  Patient has not heard from them yet.  3) HLD: On lipitor 2061maily.    Patient Active Problem List   Diagnosis Date Noted  . Type 2 diabetes mellitus without  complication, with long-term current use of insulin ([facility]/12/2017  . Essential hypertension 02/01/2018  . Hyperlipidemia 02/01/2018    Current Outpatient Medications on File Prior to Visit  Medication Sig Dispense Refill  . UBIQUINONE PO Take 200 mg by mouth daily.    . potassium chloride SA (K-DUR,KLOR-CON) 20 MEQ tablet Take 20 mEq by mouth 2 (two) times daily.  0   No current facility-administered medications on file prior to visit.     No Known Allergies  Social History   Socioeconomic History  . Marital status: Married    Spouse name: Not on file  . Number of children: 1  . Years of education: some college   . Highest education level: Not on file  Occupational History  . 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 Financial resource strain: Not hard at all  . Food insecurity:    Worry: Never true    Inability: Never true  . Transportation needs:    Medical: No    Non-medical: No  Tobacco Use  . Smoking status: Never Smoker  . Smokeless tobacco: Never Used  Substance and Sexual Activity  . Alcohol use: No  . Drug use: Never  . Sexual activity: Not Currently  Lifestyle  . Physical activity:    Days per week: 1 day    Minutes per session:  90 min  . Stress: Not at all  Relationships  . Social connections:    Talks on phone: More than three times a week    Gets together: Once a week    Attends religious service: More than 4 times per year    Active member of club or organization: Yes    Attends meetings of clubs or organizations: More than 4 times per year    Relationship status: Married  Other Topics Concern  . Not on file  Social History Narrative   Pt is from [place]. Moved here in 2001. 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     Past Surgical History:  Procedure Laterality Date  . CESAREAN SECTION    . UTERINE FIBROID SURGERY      Family History  Problem Relation Age of Onset  . Diabetes Mother   .  Diabetes Father   . Kidney disease Father     Review of Systems  Constitutional: Negative for activity change, appetite change, chills, diaphoresis, fatigue, fever and unexpected weight change.  HENT: Negative for congestion, dental problem, drooling, ear discharge, ear pain, facial swelling, hearing loss, mouth sores, nosebleeds, postnasal drip, rhinorrhea, sinus pressure, sinus pain, sneezing, sore throat, tinnitus, trouble swallowing and voice change.   Eyes: Negative for photophobia, pain, discharge, redness, itching and visual disturbance.  Respiratory: Negative for apnea, cough, choking, chest tightness, shortness of breath, wheezing and stridor.   Cardiovascular: Negative for chest pain, palpitations and leg swelling.  Gastrointestinal: Negative for abdominal distention, abdominal pain, anal bleeding, blood in stool, constipation, diarrhea, nausea, rectal pain and vomiting.  Endocrine: Negative for cold intolerance, heat intolerance, polydipsia, polyphagia and polyuria.  Genitourinary: Negative for decreased urine volume, difficulty urinating, dyspareunia, dysuria, enuresis, flank pain, frequency, genital sores, hematuria, menstrual problem, pelvic pain, urgency, vaginal bleeding, vaginal discharge and vaginal pain.  Musculoskeletal: Negative for arthralgias, back pain, gait problem, joint swelling, myalgias, neck pain and neck stiffness.  Skin: Negative for color change, pallor, rash and wound.  Allergic/Immunologic: Negative for environmental allergies, food allergies and immunocompromised state.  Neurological: Negative for dizziness, tremors, seizures, syncope, facial asymmetry, speech difficulty, weakness, light-headedness, numbness and headaches.  Hematological: Negative for adenopathy. Does not bruise/bleed easily.  Psychiatric/Behavioral: Negative for agitation, behavioral problems, confusion, decreased concentration, dysphoric mood, hallucinations, self-injury, sleep disturbance and  suicidal ideas. The patient is not nervous/anxious and is not hyperactive.     Objective:  BP (!) 171/103 (BP Location: Right Arm, Patient Position: Sitting, Cuff Size: Normal)   Pulse 78   Temp 98.8 F (37.1 C) (Oral)   Resp 18   Ht 5' 4.17" (1.63 m)   Wt 167 lb 3.2 oz (75.8 kg)   LMP 01/18/2018 (Approximate)   SpO2 97%   BMI 28.55 kg/m   Physical Exam  Constitutional: She is oriented to person, place, and time. She appears well-developed and well-nourished. No distress.  HENT:  Head: Normocephalic and atraumatic.  Right Ear: Hearing, tympanic membrane, external ear and ear canal normal.  Left Ear: Hearing, tympanic membrane, external ear and ear canal normal.  Nose: Nose normal.  Mouth/Throat: Uvula is midline, oropharynx is clear and moist and mucous membranes are normal. No oropharyngeal exudate.  Eyes: Pupils are equal, round, and reactive to light. Conjunctivae, EOM and lids are normal. No scleral icterus.  Neck: Trachea normal and normal range of motion. No thyroid mass and no thyromegaly present.  Cardiovascular: Normal rate, regular rhythm and intact distal  pulses.  Murmur heard.  Systolic murmur is present. Pulmonary/Chest: Effort normal and breath sounds normal. Right breast exhibits no inverted nipple, no mass, no nipple discharge, no skin change and no tenderness. Left breast exhibits no inverted nipple, no mass, no nipple discharge, no skin change and no tenderness. Breasts are symmetrical.  Abdominal: Soft. Normal appearance and bowel sounds are normal. There is no tenderness.  Genitourinary: Vagina normal and uterus normal. There is no lesion on the right labia. There is no lesion on the left labia. Uterus is not enlarged, not fixed and not tender. Cervix exhibits no motion tenderness and no discharge. Right adnexum displays no mass, no tenderness and no fullness. Left adnexum displays no mass, no tenderness and no fullness. No tenderness in the vagina. No vaginal  discharge found.  Lymphadenopathy:       Head (right side): No tonsillar, no preauricular, no posterior auricular and no occipital adenopathy present.       Head (left side): No tonsillar, no preauricular, no posterior auricular and no occipital adenopathy present.    She has no cervical adenopathy.       Right: No supraclavicular adenopathy present.       Left: No supraclavicular adenopathy present.  Neurological: She is alert and oriented to person, place, and time. She has normal strength and normal reflexes.  Skin: Skin is warm and dry.   Chaperone present during breast and GU exam.   Visual Acuity Screening   Right eye Left eye Both eyes  Without correction:     With correction: _0    Assessment and Plan :  Discussed healthy lifestyle, diet, exercise, preventative care, vaccinations, and addressed patient's concerns. Plan for follow up in 2 days. Otherwise, plan for specific conditions below.  1. Annual physical exam 2. Immunization counseling Titers pending. Can complete school form once labs result.Pt is holding on to the form until labs result. - Hepatitis B surface antibody - Measles/Mumps/Rubella Immunity - Varicella zoster antibody, IgG - QuantiFERON-TB Gold Plus  3. Screening for cervical cancer - Pap IG and HPV (high risk) DNA detection  4. Screening for HIV (human immunodeficiency virus) - HIV antibody  5. Essential hypertension Asymptomatic. Instructed to check bp outside of office over the next couple if days. Return to office in 2 days for reevaluation. Advised to bring bp cuff to that visit so we can compare to in office readings. Given strict ED precautions.  - lisinopril-hydrochlorothiazide (PRINZIDE,ZESTORETIC) 20-25 MG tablet; Take 1 tablet by mouth daily.  Dispense: 90 tablet; Refill: 0 - Metoprolol Succinate 100 MG CS24; Take 100 mg by mouth daily.  Dispense: 90 capsule; Refill: 0 - amLODipine (NORVASC) 10 MG tablet; Take 1 tablet (10 mg  total) by mouth daily.  Dispense: 90 tablet; Refill: 0  6. Type 2 diabetes mellitus without complication, with long-term current use of insulin (HCC) DECLINES pneumovax today but agrees to get it at follow up appt in 2 days.  - insulin glargine (LANTUS) 100 UNIT/ML injection; Inject 0.4 mLs (40 Units total) into the skin daily.  Dispense: 10 mL; Refill: 2 - Empagliflozin-metFORMIN HCl ER 03-999 MG TB24; Take 1 tablet by mouth 2 (two) times daily with a meal.  Dispense: 180 tablet; Refill: 0  7. Hyperlipidemia, unspecified hyperlipidemia type - atorvastatin (LIPITOR) 20 MG tablet; Take 1 tablet (20 mg total) by mouth daily.  Dispense: 90 tablet; Refill: [place], PA-C  [facility] 02/28/2018 3:30 PM

## 2018-02-27 NOTE — Patient Instructions (Addendum)
I have given you a prescription for tablet that contains both Jardiance and metformin so you only have to take 1 tablet.  You do not have to take your separate Jardiance tablet. Please take all other medications as prescribed.  Follow-up on Thursday. Bring your paperwork to that visit. Thank you for letting me participate in your health and well being.    Health Maintenance, Female Adopting a healthy lifestyle and getting preventive care can go a long way to promote health and wellness. Talk with your health care provider about what schedule of regular examinations is right for you. This is a good chance for you to check in with your provider about disease prevention and staying healthy. In between checkups, there are plenty of things you can do on your own. Experts have done a lot of research about which lifestyle changes and preventive measures are most likely to keep you healthy. Ask your health care provider for more information. Weight and diet Eat a healthy diet  Be sure to include plenty of vegetables, fruits, low-fat dairy products, and lean protein.  Do not eat a lot of foods high in solid fats, added sugars, or salt.  Get regular exercise. This is one of the most important things you can do for your health. ? Most adults should exercise for at least 150 minutes each week. The exercise should increase your heart rate and make you sweat (moderate-intensity exercise). ? Most adults should also do strengthening exercises at least twice a week. This is in addition to the moderate-intensity exercise.  Maintain a healthy weight  Body mass index (BMI) is a measurement that can be used to identify possible weight problems. It estimates body fat based on height and weight. Your health care provider can help determine your BMI and help you achieve or maintain a healthy weight.  For females 38 years of age and older: ? A BMI below 18.5 is considered underweight. ? A BMI of 18.5 to 24.9 is  normal. ? A BMI of 25 to 29.9 is considered overweight. ? A BMI of 30 and above is considered obese.  Watch levels of cholesterol and blood lipids  You should start having your blood tested for lipids and cholesterol at 48 years of age, then have this test every 5 years.  You may need to have your cholesterol levels checked more often if: ? Your lipid or cholesterol levels are high. ? You are older than 48 years of age. ? You are at high risk for heart disease.  Cancer screening Lung Cancer  Lung cancer screening is recommended for adults 98-75 years old who are at high risk for lung cancer because of a history of smoking.  A yearly low-dose CT scan of the lungs is recommended for people who: ? Currently smoke. ? Have quit within the past 15 years. ? Have at least a 30-pack-year history of smoking. A pack year is smoking an average of one pack of cigarettes a day for 1 year.  Yearly screening should continue until it has been 15 years since you quit.  Yearly screening should stop if you develop a health problem that would prevent you from having lung cancer treatment.  Breast Cancer  Practice breast self-awareness. This means understanding how your breasts normally appear and feel.  It also means doing regular breast self-exams. Let your health care provider know about any changes, no matter how small.  If you are in your 20s or 30s, you should have a clinical  breast exam (CBE) by a health care provider every 1-3 years as part of a regular health exam.  If you are 76 or older, have a CBE every year. Also consider having a breast X-ray (mammogram) every year.  If you have a family history of breast cancer, talk to your health care provider about genetic screening.  If you are at high risk for breast cancer, talk to your health care provider about having an MRI and a mammogram every year.  Breast cancer gene (BRCA) assessment is recommended for women who have family members  with BRCA-related cancers. BRCA-related cancers include: ? Breast. ? Ovarian. ? Tubal. ? Peritoneal cancers.  Results of the assessment will determine the need for genetic counseling and BRCA1 and BRCA2 testing.  Cervical Cancer Your health care provider may recommend that you be screened regularly for cancer of the pelvic organs (ovaries, uterus, and vagina). This screening involves a pelvic examination, including checking for microscopic changes to the surface of your cervix (Pap test). You may be encouraged to have this screening done every 3 years, beginning at age 43.  For women ages 71-65, health care providers may recommend pelvic exams and Pap testing every 3 years, or they may recommend the Pap and pelvic exam, combined with testing for human papilloma virus (HPV), every 5 years. Some types of HPV increase your risk of cervical cancer. Testing for HPV may also be done on women of any age with unclear Pap test results.  Other health care providers may not recommend any screening for nonpregnant women who are considered low risk for pelvic cancer and who do not have symptoms. Ask your health care provider if a screening pelvic exam is right for you.  If you have had past treatment for cervical cancer or a condition that could lead to cancer, you need Pap tests and screening for cancer for at least 20 years after your treatment. If Pap tests have been discontinued, your risk factors (such as having a new sexual partner) need to be reassessed to determine if screening should resume. Some women have medical problems that increase the chance of getting cervical cancer. In these cases, your health care provider may recommend more frequent screening and Pap tests.  Colorectal Cancer  This type of cancer can be detected and often prevented.  Routine colorectal cancer screening usually begins at 48 years of age and continues through 48 years of age.  Your health care provider may recommend  screening at an earlier age if you have risk factors for colon cancer.  Your health care provider may also recommend using home test kits to check for hidden blood in the stool.  A small camera at the end of a tube can be used to examine your colon directly (sigmoidoscopy or colonoscopy). This is done to check for the earliest forms of colorectal cancer.  Routine screening usually begins at age 86.  Direct examination of the colon should be repeated every 5-10 years through 48 years of age. However, you may need to be screened more often if early forms of precancerous polyps or small growths are found.  Skin Cancer  Check your skin from head to toe regularly.  Tell your health care provider about any new moles or changes in moles, especially if there is a change in a mole's shape or color.  Also tell your health care provider if you have a mole that is larger than the size of a pencil eraser.  Always use sunscreen. Apply  sunscreen liberally and repeatedly throughout the day.  Protect yourself by wearing long sleeves, pants, a wide-brimmed hat, and sunglasses whenever you are outside.  Heart disease, diabetes, and high blood pressure  High blood pressure causes heart disease and increases the risk of stroke. High blood pressure is more likely to develop in: ? People who have blood pressure in the high end of the normal range (130-139/85-89 mm Hg). ? People who are overweight or obese. ? People who are African American.  If you are 4-62 years of age, have your blood pressure checked every 3-5 years. If you are 67 years of age or older, have your blood pressure checked every year. You should have your blood pressure measured twice-once when you are at a hospital or clinic, and once when you are not at a hospital or clinic. Record the average of the two measurements. To check your blood pressure when you are not at a hospital or clinic, you can use: ? An automated blood pressure machine at  a pharmacy. ? A home blood pressure monitor.  If you are between 41 years and 75 years old, ask your health care provider if you should take aspirin to prevent strokes.  Have regular diabetes screenings. This involves taking a blood sample to check your fasting blood sugar level. ? If you are at a normal weight and have a low risk for diabetes, have this test once every three years after 48 years of age. ? If you are overweight and have a high risk for diabetes, consider being tested at a younger age or more often. Preventing infection Hepatitis B  If you have a higher risk for hepatitis B, you should be screened for this virus. You are considered at high risk for hepatitis B if: ? You were born in a country where hepatitis B is common. Ask your health care provider which countries are considered high risk. ? Your parents were born in a high-risk country, and you have not been immunized against hepatitis B (hepatitis B vaccine). ? You have HIV or AIDS. ? You use needles to inject street drugs. ? You live with someone who has hepatitis B. ? You have had sex with someone who has hepatitis B. ? You get hemodialysis treatment. ? You take certain medicines for conditions, including cancer, organ transplantation, and autoimmune conditions.  Hepatitis C  Blood testing is recommended for: ? Everyone born from [birth year] through 1966. ? Anyone with known risk factors for hepatitis C.  Sexually transmitted infections (STIs)  You should be screened for sexually transmitted infections (STIs) including gonorrhea and chlamydia if: ? You are sexually active and are younger than 48 years of age. ? You are older than 48 years of age and your health care provider tells you that you are at risk for this type of infection. ? Your sexual activity has changed since you were last screened and you are at an increased risk for chlamydia or gonorrhea. Ask your health care provider if you are at risk.  If you do  not have HIV, but are at risk, it may be recommended that you take a prescription medicine daily to prevent HIV infection. This is called pre-exposure prophylaxis (PrEP). You are considered at risk if: ? You are sexually active and do not regularly use condoms or know the HIV status of your partner(s). ? You take drugs by injection. ? You are sexually active with a partner who has HIV.  Talk with your health care provider  about whether you are at high risk of being infected with HIV. If you choose to begin PrEP, you should first be tested for HIV. You should then be tested every 3 months for as long as you are taking PrEP. Pregnancy  If you are premenopausal and you may become pregnant, ask your health care provider about preconception counseling.  If you may become pregnant, take 400 to 800 micrograms (mcg) of folic acid every day.  If you want to prevent pregnancy, talk to your health care provider about birth control (contraception). Osteoporosis and menopause  Osteoporosis is a disease in which the bones lose minerals and strength with aging. This can result in serious bone fractures. Your risk for osteoporosis can be identified using a bone density scan.  If you are 31 years of age or older, or if you are at risk for osteoporosis and fractures, ask your health care provider if you should be screened.  Ask your health care provider whether you should take a calcium or vitamin D supplement to lower your risk for osteoporosis.  Menopause may have certain physical symptoms and risks.  Hormone replacement therapy may reduce some of these symptoms and risks. Talk to your health care provider about whether hormone replacement therapy is right for you. Follow these instructions at home:  Schedule regular health, dental, and eye exams.  Stay current with your immunizations.  Do not use any tobacco products including cigarettes, chewing tobacco, or electronic cigarettes.  If you are  pregnant, do not drink alcohol.  If you are breastfeeding, limit how much and how often you drink alcohol.  Limit alcohol intake to no more than 1 drink per day for nonpregnant women. One drink equals 12 ounces of beer, 5 ounces of wine, or 1 ounces of hard liquor.  Do not use street drugs.  Do not share needles.  Ask your health care provider for help if you need support or information about quitting drugs.  Tell your health care provider if you often feel depressed.  Tell your health care provider if you have ever been abused or do not feel safe at home. This information is not intended to replace advice given to you by your health care provider. Make sure you discuss any questions you have with your health care provider. Document Released: 05/03/2011 Document Revised: 03/25/2016 Document Reviewed: 07/22/2015 Elsevier Interactive Patient Education  2018 [employer].   IF you received an x-ray today, you will receive an invoice from [facility]. Please contact [facility] at [phone] with questions or concerns regarding your invoice.   IF you received labwork today, you will receive an invoice from [facility]. Please contact [facility] at [phone] with questions or concerns regarding your invoice.   Our billing staff will not be able to assist you with questions regarding bills from these companies.  You will be contacted with the lab results as soon as they are available. The fastest way to get your results is to activate your My Chart account. Instructions are located on the last page of this paperwork. If you have not heard from [place] regarding the results in 2 weeks, please contact this office.

## 2018-02-27 NOTE — Telephone Encounter (Signed)
Left message prescription no longer needed to cancel PA request

## 2018-03-01 LAB — PAP IG AND HPV HIGH-RISK
HPV, HIGH-RISK: NEGATIVE
PAP SMEAR COMMENT: 0

## 2018-03-02 ENCOUNTER — Ambulatory Visit: Payer: BLUE CROSS/BLUE SHIELD | Admitting: Physician Assistant

## 2018-03-02 ENCOUNTER — Other Ambulatory Visit: Payer: Self-pay

## 2018-03-02 ENCOUNTER — Encounter: Payer: Self-pay | Admitting: Physician Assistant

## 2018-03-02 VITALS — BP 158/88 | HR 67 | Temp 98.4°F | Resp 18 | Ht 63.39 in | Wt 169.2 lb

## 2018-03-02 DIAGNOSIS — I10 Essential (primary) hypertension: Secondary | ICD-10-CM

## 2018-03-02 DIAGNOSIS — E663 Overweight: Secondary | ICD-10-CM | POA: Diagnosis not present

## 2018-03-02 DIAGNOSIS — Z0289 Encounter for other administrative examinations: Secondary | ICD-10-CM

## 2018-03-02 LAB — MEASLES/MUMPS/RUBELLA IMMUNITY
MUMPS ABS, IGG: 11.7 AU/mL (ref >10.9)
RUBEOLA AB, IGG: >300 AU/mL (ref >29.9)
Rubella Antibodies, IGG: 20.7 index (ref >0.99)

## 2018-03-02 LAB — QUANTIFERON-TB GOLD PLUS
QUANTIFERON NIL VALUE: 0.07 [IU]/mL
QUANTIFERON TB1 AG VALUE: 0.07 [IU]/mL
QUANTIFERON TB2 AG VALUE: 0.05 [IU]/mL
QUANTIFERON-TB GOLD PLUS: NEGATIVE

## 2018-03-02 LAB — HEPATITIS B SURFACE ANTIBODY, QUANTITATIVE: HEPATITIS B SURF AB QUANT: 34.9 m[IU]/mL

## 2018-03-02 LAB — HIV ANTIBODY (ROUTINE TESTING W REFLEX): HIV Screen 4th Generation wRfx: NONREACTIVE

## 2018-03-02 LAB — VARICELLA ZOSTER ANTIBODY, IGG: Varicella zoster IgG: 1480 index (ref >165)

## 2018-03-02 MED ORDER — LISINOPRIL 40 MG PO TABS: 40.0000 mg | ORAL_TABLET | Freq: Every day | ORAL | 0 refills | Status: DC

## 2018-03-02 MED ORDER — HYDROCHLOROTHIAZIDE 25 MG PO TABS: 25.0000 mg | ORAL_TABLET | Freq: Every day | ORAL | 0 refills | Status: DC

## 2018-03-02 NOTE — Progress Notes (Signed)
MRN: [id number] DOB: 02-18-1971  Subjective:   Yolanda Herrera is a 47 y.o. female presenting for follow up on Hypertension and completion of paperwork. Last seen in office on 4/29. BP elevated at 171/103. She was asx. Notes her home readings have been in 140s, feels like it is related to when she comes to the office. Has been taking medications as prescribed. Was advised to return in a few days for reevaluation and bring home bp cuff to office for comparison to our readings. Currently managed with amlodipine 10 mg, lisinopril-HCTZ 20-25 mg, and metoprolol 100 mg daily. Patient has not checked bp since last visit.  Reports no symptoms. Denies lightheadedness, dizziness, chronic headache, double vision, chest pain, shortness of breath, heart racing, palpitations, nausea, vomiting, abdominal pain, hematuria, lower leg swelling. Lifestyle: Avoiding excessive salt intake. Trying to exercise on a regular basis. Notes she does snore sometimes but denies apneic episodes, excessive daytime sleepiness, chronic headache, and falling asleep while watching TV/driving. Notes she has strong FH of difficult to control HTN in sister and father. Her sister had a stroke due to her uncontrolled HTN. Of note, pt has initial appointment with cardiology on 03/13/18.   Yolanda Herrera has a current medication list which includes the following prescription(s): amlodipine, atorvastatin, empagliflozin-metformin hcl er, insulin glargine, lisinopril-hydrochlorothiazide, metoprolol succinate, potassium chloride sa, and ubiquinone. Also has No Known Allergies.  Yolanda Herrera  has a past medical history of Diabetes mellitus without complication (HCC) and Hypertension. Also  has a past surgical history that includes Uterine fibroid surgery and Cesarean section.   Objective:   Vitals: BP (!) 158/88 (BP Location: Left Arm, Patient Position: Sitting, Cuff Size: Normal)   Pulse 67   Temp 98.4 F (36.9 C) (Oral)   Resp 18   Ht 5' 3.39" (1.61 m)    Wt 169 lb 3.2 oz (76.7 kg)   LMP 01/18/2018 (Approximate)   SpO2 100%   BMI 29.61 kg/m   Physical Exam  Constitutional: She is oriented to person, place, and time. She appears well-developed and well-nourished.  HENT:  Head: Normocephalic and atraumatic.  Eyes: Conjunctivae are normal.  Neck: Normal range of motion.  Pulmonary/Chest: Effort normal.  Neurological: She is alert and oriented to person, place, and time.  Skin: Skin is warm and dry.  Psychiatric: She has a normal mood and affect.  Vitals reviewed.   No results found for this or any previous visit (from the past 24 hour(s)).  BP Readings from Last 3 Encounters:  03/02/18 (!) 158/88  02/27/18 (!) 171/103  02/15/18 (!) 157/87   Pulse Readings from Last 3 Encounters:  03/02/18 67  02/27/18 78  02/15/18 60     Assessment and Plan :  1. Essential hypertension 2. Overweight (BMI 25.0-29.9) BP improved today compared to last visit. She continues to remain asymptomatic. Unfortunately pt did not remember to bring home bp cuff in today. However, even if home readings are in the 140s systolically, she is still uncontrolled. She is on the max dose of amlodipine and HCTZ. Will increase lisinopril dose to max dose of  daily. Will keep metoprolol dose at the same dose as pt's HR in 60s. Pt warrants further workup for potential secondary causes of HTN at this time. Has follow up with cardiology on 03/13/18 and strongly encouraged her to make this appointment. Pt agrees to do so. Plan to follow up with me in about 2 months.  - lisinopril (PRINIVIL,ZESTRIL) 40 MG tablet; Take 1 tablet (40 mg  total) by mouth daily.  Dispense: 90 tablet; Refill: 0 - hydrochlorothiazide (HYDRODIURIL) 25 MG tablet; Take 1 tablet (25 mg total) by mouth daily.  Dispense: 90 tablet; Refill: 0   3. Encounter for completion of form with patient School form completed with patient, copy made, original form given to patient.    Benjiman Core, PA-C    Primary [facility] at [facility] 03/02/2018 5:17 PM

## 2018-03-02 NOTE — Patient Instructions (Addendum)
In terms of elevated blood pressure, I would like you to discontinue lisinopril-hctz combo tablet.Start lisinopril  and HCTZ  along with your norvasc  and metoprolol  daily. Follow up with cardiology on 5/13 as planned. Check your blood pressure at least a couple times over the next week outside of the office and document these values. It is best if you check the blood pressure at different times in the day. Your goal is <140/90. If your values are consistently above this goal, please return to office for further evaluation. If you start to have chest pain, blurred vision, shortness of breath, severe headache, lower leg swelling, or nausea/vomiting please seek care immediately here or at the ED.     Return here in about 2 months. Thank you for letting me participate in your health and well being.    IF you received an x-ray today, you will receive an invoice from [facility]. Please contact [facility] at [phone] with questions or concerns regarding your invoice.   IF you received labwork today, you will receive an invoice from [facility]. Please contact [facility] at [phone] with questions or concerns regarding your invoice.   Our billing staff will not be able to assist you with questions regarding bills from these companies.  You will be contacted with the lab results as soon as they are available. The fastest way to get your results is to activate your My Chart account. Instructions are located on the last page of this paperwork. If you have not heard from [place] regarding the results in 2 weeks, please contact this office.

## 2018-03-03 ENCOUNTER — Encounter: Payer: Self-pay | Admitting: Physician Assistant

## 2018-03-09 NOTE — Progress Notes (Deleted)
Cardiology Office Note    Date:  03/09/2018   ID:  Yolanda Herrera, DOB 02-Oct-1971, MRN [id number]  PCP:  Magdalene River, PA-C  Cardiologist: No primary care provider on file.  No chief complaint on file.   History of Present Illness:  Yolanda Herrera is a 47 y.o. female with history of difficult to control hypertension, sister had a CVA secondary to uncontrolled hypertension.  She also is diabetic.  She was sent to [place] for work-up of possible secondary causes of hypertension.  EKG reviewed from 01/30/2018 showed normal sinus rhythm with LVH and anterior lateral T wave inversion.  No EKG to compare to.    Past Medical History:  Diagnosis Date  . Diabetes mellitus without complication (HCC)   . Hypertension     Past Surgical History:  Procedure Laterality Date  . CESAREAN SECTION    . UTERINE FIBROID SURGERY      Current Medications: No outpatient medications have been marked as taking for the 03/13/18 encounter (Appointment) with Dyann Kief, PA-C.     Allergies:   Patient has no known allergies.   Social History   Socioeconomic History  . Marital status: Married    Spouse name: Not on file  . Number of children: 1  . Years of education: some college   . Highest education level: Not on file  Occupational History  . 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 Financial resource strain: Not hard at all  . Food insecurity:    Worry: Never true    Inability: Never true  . Transportation needs:    Medical: No    Non-medical: No  Tobacco Use  . Smoking status: Never Smoker  . Smokeless tobacco: Never Used  Substance and Sexual Activity  . Alcohol use: No  . Drug use: Never  . Sexual activity: Not Currently  Lifestyle  . Physical activity:    Days per week: 1 day    Minutes per session: 90 min  . Stress: Not at all  Relationships  . Social connections:    Talks on phone: More than three times a week    Gets together: Once a  week    Attends religious service: More than 4 times per year    Active member of club or organization: Yes    Attends meetings of clubs or organizations: More than 4 times per year    Relationship status: Married  Other Topics Concern  . Not on file  Social History Narrative   Pt is from [place]. Moved here in 2001. 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      Family History:  The patient's ***family history includes Diabetes in her father and mother; Hypertension in her father and sister; Kidney disease in her father; Stroke in her sister.   ROS:   Please see the history of present illness.    ROS All other systems reviewed and are negative.   PHYSICAL EXAM:   VS:  LMP 01/18/2018 (Approximate)   Physical Exam  GEN: Well nourished, well developed, in no acute distress  HEENT: normal  Neck: no JVD, carotid bruits, or masses Cardiac:RRR; no murmurs, rubs, or gallops  Respiratory:  clear to auscultation bilaterally, normal work of breathing GI: soft, nontender, nondistended, + BS Ext: without cyanosis, clubbing, or edema, Good distal pulses bilaterally MS: no deformity or atrophy  Skin: warm and dry, no rash Neuro:  Alert and Oriented x 3, Strength and sensation are intact Psych: euthymic mood, full affect  Wt Readings from Last 3 Encounters:  03/02/18 169 lb 3.2 oz (76.7 kg)  02/27/18 167 lb 3.2 oz (75.8 kg)  02/15/18 167 lb 6.4 oz (75.9 kg)      Studies/Labs Reviewed:   EKG:  EKG is not ordered today.  EKG reviewed from 01/30/2018 showed normal sinus rhythm with LVH and anterior lateral T wave inversion.  No EKG to compare to.   Recent Labs: 01/30/2018: ALT 32; BUN 9; Creatinine, Ser 0.92; Hemoglobin 13.7; Platelets 234; Potassium 3.5; Sodium 145; TSH 0.985   Lipid Panel    Component Value Date/Time   CHOL 171 01/30/2018 1119   TRIG 133 01/30/2018 1119   HDL 43 01/30/2018 1119   CHOLHDL 4.0 01/30/2018 1119   LDLCALC 101 (H) 01/30/2018 1119     Additional studies/ records that were reviewed today include:  ***    ASSESSMENT:    No diagnosis found.   PLAN:  In order of problems listed above:      Medication Adjustments/Labs and Tests Ordered: Current medicines are reviewed at length with the patient today.  Concerns regarding medicines are outlined above.  Medication changes, Labs and Tests ordered today are listed in the Patient Instructions below. There are no Patient Instructions on file for this visit.   Elson Clan, PA-C  03/09/2018 3:09 PM    [facility] [address] [place], [place], [place]  [address] Phone: [phone]; Fax: [phone]

## 2018-03-13 ENCOUNTER — Ambulatory Visit: Payer: BLUE CROSS/BLUE SHIELD | Admitting: Physician Assistant

## 2018-04-18 NOTE — Progress Notes (Signed)
Cardiology Office Note    Date:  04/19/2018   ID:  Yolanda Herrera, DOB September 27, 1971, MRN [id number]  PCP:  Magdalene RiverWiseman, Brittany D, PA-C  Cardiologist: New  Chief Complaint  Patient presents with  . New Patient (Initial Visit)    History of Present Illness:   Yolanda Herrera is a 47 y.o. female originally from [place], who is being seen today for the evaluation of HTN at the request of Magdalene RiverWiseman, Brittany D, [place]*.  Patient's blood pressures have been elevated despite maximum doses of amlodipine and HCTZ.  Lisinopril recently increased.  Metoprolol was kept at the same dose because of heart rates in the 60s.  PCP was wondering if the she needed a work-up for secondary causes of hypertension.  Strong family history of difficult to control hypertension.  Sister 3649 had a stroke due to uncontrolled hypertension. Also has HLD with LDL 101, Hbg AIC 12.6.   Patient comes in today and says she was diagnosed with HTN at 47 yrs old. 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 Complains of getting tired when working but otherwise denies chest pain, palpitations, dyspnea, dyspnea on exertions, headaches, palpitations, dizziness or presyncope.  Eats a low-sodium diet.  Diabetes was uncontrolled but she is says she is doing much better with this.  Patient says blood pressures run 140/80.  Cooks with a low-sodium diet.   Past Medical History:  Diagnosis Date  . Diabetes mellitus without complication (HCC)   . Hypertension     Past Surgical History:  Procedure Laterality Date  . CESAREAN SECTION    . UTERINE FIBROID SURGERY      Current Medications: Current Meds  Medication Sig  . amLODipine (NORVASC) 10 MG tablet Take 1 tablet (10 mg total) by mouth daily.  Marland Kitchen. atorvastatin (LIPITOR) 20 MG tablet Take 1 tablet (20 mg total) by mouth daily.  . Empagliflozin-metFORMIN HCl ER 03-999 MG TB24 Take 1  tablet by mouth 2 (two) times daily with a meal.  . insulin glargine (LANTUS) 100 UNIT/ML injection Inject 0.4 mLs (40 Units total) into the skin daily.  Marland Kitchen. lisinopril (PRINIVIL,ZESTRIL) 40 MG tablet Take 1 tablet (40 mg total) by mouth daily.  . Metoprolol Succinate 100 MG CS24 Take 100 mg by mouth daily.  Marland Kitchen. UBIQUINONE PO Take 200 mg by mouth daily.  . [DISCONTINUED] hydrochlorothiazide (HYDRODIURIL) 25 MG tablet Take 1 tablet (25 mg total) by mouth daily.     Allergies:   Patient has no known allergies.   Social History   Socioeconomic History  . Marital status: Married    Spouse name: Not on file  . Number of children: 1  . Years of education: some college   . Highest education level: Not on file  Occupational History  . 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 Financial resource strain: Not hard at all  . Food insecurity:    Worry: Never true    Inability: Never true  . Transportation needs:    Medical: No    Non-medical: No  Tobacco Use  . Smoking status: Never Smoker  . Smokeless tobacco: Never Used  Substance and Sexual Activity  . Alcohol use: No  . Drug use: Never  . Sexual activity: Not Currently  Lifestyle  . Physical activity:    Days per week: 1 day  Minutes per session: 90 min  . Stress: Not at all  Relationships  . Social connections:    Talks on phone: More than three times a week    Gets together: Once a week    Attends religious service: More than 4 times per year    Active member of club or organization: Yes    Attends meetings of clubs or organizations: More than 4 times per year    Relationship status: Married  Other Topics Concern  . Not on file  Social History Narrative   Pt is from [place]. Moved here in 2001. 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      Family History:  The patient's family history includes Diabetes in her father and mother; Hypertension in her father and sister; Kidney  disease in her father; Stroke in her sister.   ROS:   Please see the history of present illness.    Review of Systems  Constitution: Negative.  HENT: Negative.   Eyes: Negative.   Cardiovascular: Negative.   Respiratory: Negative.   Hematologic/Lymphatic: Negative.   Musculoskeletal: Negative.  Negative for joint pain.  Gastrointestinal: Negative.   Genitourinary: Negative.   Neurological: Negative.    All other systems reviewed and are negative.   PHYSICAL EXAM:   VS:  BP (!) 190/98   Pulse 69   Ht 5\' 4"  (1.626 m)   Wt 165 lb (74.8 kg)   LMP 01/18/2018 (Approximate)   SpO2 99%   BMI 28.32 kg/m   Physical Exam  GEN: Well nourished, well developed, in no acute distress  Neck: no JVD, carotid bruits, or masses Cardiac:RRR; positive S4, 2/6 systolic murmur at the left sternal border Respiratory:  clear to auscultation bilaterally, normal work of breathing GI: soft, nontender, nondistended, + BS Ext: without cyanosis, clubbing, or edema, Good distal pulses bilaterally Neuro:  Alert and Oriented x 3 Psych: euthymic mood, full affect  Wt Readings from Last 3 Encounters:  04/19/18 165 lb (74.8 kg)  03/02/18 169 lb 3.2 oz (76.7 kg)  02/27/18 167 lb 3.2 oz (75.8 kg)      Studies/Labs Reviewed:   EKG:  EKG is not ordered today.  The ekg reviewed from 01/30/2018 normal sinus rhythm with LVH and T wave inversion laterally Recent Labs: 01/30/2018: ALT 32; BUN 9; Creatinine, Ser 0.92; Hemoglobin 13.7; Platelets 234; Potassium 3.5; Sodium 145; TSH 0.985   Lipid Panel    Component Value Date/Time   CHOL 171 01/30/2018 1119   TRIG 133 01/30/2018 1119   HDL 43 01/30/2018 1119   CHOLHDL 4.0 01/30/2018 1119   LDLCALC 101 (H) 01/30/2018 1119    Additional studies/ records that were reviewed today include:  Labs reviewed from PCP    ASSESSMENT:    1. Resistant hypertension   2. Hyperlipidemia, unspecified hyperlipidemia type   3. Type 2 diabetes mellitus without  complication, with long-term current use of insulin (HCC)      PLAN:  In order of problems listed above:  Resistant hypertension on maximum doses of amlodipine, lisinopril, HCTZ, and metoprolol.  Patient says her blood pressures at home running 140/80 but the pressure is 190/98 here today.  Recent labs were normal including TSH.  Discussed with Dr. Johney Frame and we recommend a.m. cortisol and plasma renin aldosterone ratio although this could be affected by her ACE inhibitor.  We will stop hydrochlorothiazide and add chlorthalidone 25 mg daily for better blood pressure control and easier titration.  Also recommend 2D echo for LVH and renal ultrasound to rule out renal artery stenosis.  2 g sodium diet.  He recommend she follow-up with Dr. Chilton Si for further work-up and treatment.  Hyperlipidemia on Lipitor recent LDL 101  Type 2 diabetes mellitus with hemoglobin A1c over 12 but patient said she has gotten this under better control recently with the change of medicines and diet.  Medication Adjustments/Labs and Tests Ordered: Current medicines are reviewed at length with the patient today.  Concerns regarding medicines are outlined above.  Medication changes, Labs and Tests ordered today are listed in the Patient Instructions below. Patient Instructions   Medication Instructions:  Your physician has recommended you make the following change in your medication:  1.  STOP the Hydrochlorothiazide  2.  START Chlorthalidone 25 mg taking 1 tablet daily  Labwork: TODAY:  RENIN ALDOSTERONE LEVEL  AT PATIENTS CONVENIENCE:   AM CORTISOL LEVEL  Testing/Procedures: Your physician has requested that you have an echocardiogram. Echocardiography is a painless test that uses sound waves to create images of your heart. It provides your doctor with information about the size and shape of your heart and how well your heart's chambers and valves are working. This procedure takes approximately one hour.  There are no restrictions for this procedure.  Your physician has requested that you have a renal artery duplex. During this test, an ultrasound is used to evaluate blood flow to the kidneys. Allow one hour for this exam. Do not eat after midnight the day before and avoid carbonated beverages. Take your medications as you usually do.    Follow-Up: Your physician recommends that you schedule a follow-up appointment in: 1st AVAILABLE WITH DR. Hudson AS NEW CARDIOLOGIST FOR HYPERTENSION    Any Other Special Instructions Will Be Listed Below (If Applicable).  Two Gram Sodium Diet 2000 mg  What is Sodium? Sodium is a mineral found naturally in many foods. The most significant source of sodium in the diet is table salt, which is about 40% sodium.  Processed, convenience, and preserved foods also contain a large amount of sodium.  The body needs only 500 mg of sodium daily to function,  A normal diet provides more than enough sodium even if you do not use salt.  Why Limit Sodium? A build up of sodium in the body can cause thirst, increased blood pressure, shortness of breath, and water retention.  Decreasing sodium in the diet can reduce edema and risk of heart attack or stroke associated with high blood pressure.  Keep in mind that there are many other factors involved in these health problems.  Heredity, obesity, lack of exercise, cigarette smoking, stress and what you eat all play a role.  General Guidelines:  Do not add salt at the table or in cooking.  One teaspoon of salt contains over 2 grams of sodium.  Read food labels  Avoid processed and convenience foods  Ask your dietitian before eating any foods not dicussed in the menu planning guidelines  Consult your physician if you wish to use a salt substitute or a sodium containing medication such as antacids.  Limit milk and milk products to 16 oz (2 cups) per day.  Shopping Hints:  READ LABELS!! "Dietetic" does not necessarily mean  low sodium.  Salt and other sodium ingredients are often added to foods during processing.   Menu Planning Guidelines Food Group Choose More Often Avoid  Beverages (see also the milk group All fruit juices, low-sodium, salt-free vegetables  juices, low-sodium carbonated beverages Regular vegetable or tomato juices, commercially softened water used for drinking or cooking  Breads and Cereals Enriched white, wheat, rye and pumpernickel bread, hard rolls and dinner rolls; muffins, cornbread and waffles; most dry cereals, cooked cereal without added salt; unsalted crackers and breadsticks; low sodium or homemade bread crumbs Bread, rolls and crackers with salted tops; quick breads; instant hot cereals; pancakes; commercial bread stuffing; self-rising flower and biscuit mixes; regular bread crumbs or cracker crumbs  Desserts and Sweets Desserts and sweets mad with mild should be within allowance Instant pudding mixes and cake mixes  Fats Butter or margarine; vegetable oils; unsalted salad dressings, regular salad dressings limited to 1 Tbs; light, sour and heavy cream Regular salad dressings containing bacon fat, bacon bits, and salt pork; snack dips made with instant soup mixes or processed cheese; salted nuts  Fruits Most fresh, frozen and canned fruits Fruits processed with salt or sodium-containing ingredient (some dried fruits are processed with sodium sulfites        Vegetables Fresh, frozen vegetables and low- sodium canned vegetables Regular canned vegetables, sauerkraut, pickled vegetables, and others prepared in brine; frozen vegetables in sauces; vegetables seasoned with ham, bacon or salt pork  Condiments, Sauces, Miscellaneous  Salt substitute with physician's approval; pepper, herbs, spices; vinegar, lemon or lime juice; hot pepper sauce; garlic powder, onion powder, low sodium soy sauce (1 Tbs.); low sodium condiments (ketchup, chili sauce, mustard) in limited amounts (1 tsp.) fresh  ground horseradish; unsalted tortilla chips, pretzels, potato chips, popcorn, salsa (1/4 cup) Any seasoning made with salt including garlic salt, celery salt, onion salt, and seasoned salt; sea salt, rock salt, kosher salt; meat tenderizers; monosodium glutamate; mustard, regular soy sauce, barbecue, sauce, chili sauce, teriyaki sauce, steak sauce, Worcestershire sauce, and most flavored vinegars; canned gravy and mixes; regular condiments; salted snack foods, olives, picles, relish, horseradish sauce, catsup   Food preparation: Try these seasonings Meats:    Pork Sage, onion Serve with applesauce  Chicken Poultry seasoning, thyme, parsley Serve with cranberry sauce  Lamb Curry powder, rosemary, garlic, thyme Serve with mint sauce or jelly  Veal Marjoram, basil Serve with current jelly, cranberry sauce  Beef Pepper, bay leaf Serve with dry mustard, unsalted chive butter  Fish [place] leaf, dill Serve with unsalted lemon butter, unsalted parsley butter  Vegetables:    Asparagus Lemon juice   Broccoli Lemon juice   Carrots Mustard dressing parsley, mint, nutmeg, glazed with unsalted butter and sugar   Green beans Marjoram, lemon juice, nutmeg,dill seed   Tomatoes Basil, marjoram, onion   Spice /blend for [employer]" 4 tsp ground thyme 1 tsp ground sage 3 tsp ground rosemary 4 tsp ground marjoram   Test your knowledge 1. A product that says "Salt Free" may still contain sodium. True or False 2. Garlic Powder and Hot Pepper Sauce an be used as alternative seasonings.True or False 3. Processed foods have more sodium than fresh foods.  True or False 4. Canned Vegetables have less sodium than froze True or False  WAYS TO DECREASE YOUR SODIUM INTAKE 1. Avoid the use of added salt in cooking and at the table.  Table salt (and other prepared seasonings which contain salt) is probably one of the greatest sources of sodium in the diet.  Unsalted foods can gain flavor from the sweet, sour, and butter  taste sensations of herbs and spices.  Instead of using salt for seasoning, try the following seasonings with the foods listed.  Remember:  how you use them to enhance natural food flavors is limited only by your creativity... Allspice-Meat, fish, eggs, fruit, peas, red and yellow vegetables Almond Extract-Fruit baked goods Anise Seed-Sweet breads, fruit, carrots, beets, cottage cheese, cookies (tastes like licorice) Basil-Meat, fish, eggs, vegetables, rice, vegetables salads, soups, sauces Bay Leaf-Meat, fish, stews, poultry Burnet-Salad, vegetables (cucumber-like flavor) Caraway Seed-Bread, cookies, cottage cheese, meat, vegetables, cheese, rice Cardamon-Baked goods, fruit, soups Celery Powder or seed-Salads, salad dressings, sauces, meatloaf, soup, bread.Do not use  celery salt Chervil-Meats, salads, fish, eggs, vegetables, cottage cheese (parsley-like flavor) Chili Power-Meatloaf, chicken cheese, corn, eggplant, egg dishes Chives-Salads cottage cheese, egg dishes, soups, vegetables, sauces Cilantro-Salsa, casseroles Cinnamon-Baked goods, fruit, pork, lamb, chicken, carrots Cloves-Fruit, baked goods, fish, pot roast, green beans, beets, carrots Coriander-Pastry, cookies, meat, salads, cheese (lemon-orange flavor) Cumin-Meatloaf, fish,cheese, eggs, cabbage,fruit pie (caraway flavor) [employer], fruit, eggs, fish, poultry, cottage cheese, vegetables Dill Seed-Meat, cottage cheese, poultry, vegetables, fish, salads, bread Fennel Seed-Bread, cookies, apples, pork, eggs, fish, beets, cabbage, cheese, Licorice-like flavor Garlic-(buds or powder) Salads, meat, poultry, fish, bread, butter, vegetables, potatoes.Do not  use garlic salt Ginger-Fruit, vegetables, baked goods, meat, fish, poultry Horseradish Root-Meet, vegetables, butter Lemon Juice or Extract-Vegetables, fruit, tea, baked goods, fish salads Mace-Baked goods fruit, vegetables, fish, poultry (taste like nutmeg) Maple  Extract-Syrups Marjoram-Meat, chicken, fish, vegetables, breads, green salads (taste like Sage) Mint-Tea, lamb, sherbet, vegetables, desserts, carrots, cabbage Mustard, Dry or Seed-Cheese, eggs, meats, vegetables, poultry Nutmeg-Baked goods, fruit, chicken, eggs, vegetables, desserts Onion Powder-Meat, fish, poultry, vegetables, cheese, eggs, bread, rice salads (Do not use   Onion salt) Orange Extract-Desserts, baked goods Oregano-Pasta, eggs, cheese, onions, pork, lamb, fish, chicken, vegetables, green salads Paprika-Meat, fish, poultry, eggs, cheese, vegetables Parsley Flakes-Butter, vegetables, meat fish, poultry, eggs, bread, salads (certain forms may   Contain sodium Pepper-Meat fish, poultry, vegetables, eggs Peppermint Extract-Desserts, baked goods Poppy Seed-Eggs, bread, cheese, fruit dressings, baked goods, noodles, vegetables, cottage  [employer], poultry, meat, fish, cauliflower, turnips,eggs bread Saffron-Rice, bread, veal, chicken, fish, eggs Sage-Meat, fish, poultry, onions, eggplant, tomateos, pork, stews Savory-Eggs, salads, poultry, meat, rice, vegetables, soups, pork Tarragon-Meat, poultry, fish, eggs, butter, vegetables (licorice-like flavor)  Thyme-Meat, poultry, fish, eggs, vegetables, (clover-like flavor), sauces, soups Tumeric-Salads, butter, eggs, fish, rice, vegetables (saffron-like flavor) Vanilla Extract-Baked goods, candy Vinegar-Salads, vegetables, meat marinades Walnut Extract-baked goods, candy  2. Choose your Foods Wisely   The following is a list of foods to avoid which are high in sodium:  Meats-Avoid all smoked, canned, salt cured, dried and kosher meat and fish as well as Anchovies   [employer] meats:Bologna, Liverwurst, Pastrami Canned meat or fish  Marinated herring Caviar    Pepperoni Corned Beef   Pizza Dried chipped beef  Salami Frozen breaded fish or meat Salt pork Frankfurters or hot  dogs  Sardines Gefilte fish   Sausage Ham (boiled ham, Proscuitto Smoked butt    spiced ham)   Spam      TV Dinners Vegetables Canned vegetables (Regular) Relish Canned mushrooms  Sauerkraut Olives    Tomato juice Pickles  Bakery and Dessert Products Canned puddings  Cream pies Cheesecake   Decorated cakes Cookies  Beverages/Juices Tomato juice, regular  Gatorade   V-8 vegetable juice, regular  Breads and Cereals Biscuit mixes   Salted potato chips, corn chips, pretzels Bread stuffing mixes  Salted crackers and rolls Pancake and waffle mixes Self-rising flour  Seasonings Accent    Meat sauces Barbecue sauce  Meat tenderizer Catsup  Monosodium glutamate (MSG) Celery salt   Onion salt Chili sauce   Prepared mustard Garlic salt   Salt, seasoned salt, sea salt Gravy mixes   Soy sauce Horseradish   Steak sauce Ketchup   Tartar sauce Lite salt    Teriyaki sauce Marinade mixes   Worcestershire sauce  Others Baking powder   Cocoa and cocoa mixes Baking soda   Commercial casserole mixes Candy-caramels, chocolate  Dehydrated soups    Bars, fudge,nougats  Instant rice and pasta mixes Canned broth or soup  Maraschino cherries Cheese, aged and processed cheese and cheese spreads  Learning Assessment Quiz  Indicated T (for True) or F (for False) for each of the following statements:  1. _____ Fresh fruits and vegetables and unprocessed grains are generally low in sodium 2. _____ Water may contain a considerable amount of sodium, depending on the source 3. _____ You can always tell if a food is high in sodium by tasting it 4. _____ Certain laxatives my be high in sodium and should be avoided unless prescribed   by a physician or pharmacist 5. _____ Salt substitutes may be used freely by anyone on a sodium restricted diet 6. _____ Sodium is present in table salt, food additives and as a natural component of   most foods 7. _____ Table salt is approximately 90%  sodium 8. _____ Limiting sodium intake may help prevent excess fluid accumulation in the body 9. _____ On a sodium-restricted diet, seasonings such as bouillon soy sauce, and    cooking wine should be used in place of table salt 10. _____ On an ingredient list, a product which lists monosodium glutamate as the first   ingredient is an appropriate food to include on a low sodium diet  Circle the best answer(s) to the following statements (Hint: there may be more than one correct answer)  11. On a low-sodium diet, some acceptable snack items are:    A. Olives  F. Bean dip   K. Grapefruit juice    B. Salted Pretzels G. Commercial Popcorn   L. Canned peaches    C. Carrot Sticks  H. Bouillon   M. Unsalted nuts   D. [place] fries  I. Peanut butter crackers N. Salami   E. Sweet pickles J. Tomato Juice   O. Pizza  12.  Seasonings that may be used freely on a reduced - sodium diet include   A. Lemon wedges F.Monosodium glutamate K. Celery seed    B.Soysauce   G. Pepper   L. Mustard powder   C. Sea salt  H. Cooking wine  M. Onion flakes   D. Vinegar  E. Prepared horseradish N. Salsa   E. Sage   J. Worcestershire sauce  O. Chutney  Echocardiogram An echocardiogram, or echocardiography, uses sound waves (ultrasound) to produce an image of your heart. The echocardiogram is simple, painless, obtained within a short period of time, and offers valuable information to your health care provider. The images from an echocardiogram can provide information such as:  Evidence of coronary artery disease (CAD).  Heart size.  Heart muscle function.  Heart valve function.  Aneurysm detection.  Evidence of a past heart attack.  Fluid buildup around the heart.  Heart muscle thickening.  Assess heart valve function.  Tell a health care provider about:  Any allergies you have.  All medicines you are taking, including vitamins, herbs, eye drops, creams, and over-the-counter medicines.  Any  problems you or family members have had with anesthetic medicines.  Any blood disorders you have.  Any surgeries you have had.  Any medical conditions you have.  Whether you are pregnant or may be pregnant. What happens before the procedure? No special preparation is needed. Eat and drink normally. What happens during the procedure?  In order to produce an image of your heart, gel will be applied to your chest and a wand-like tool (transducer) will be moved over your chest. The gel will help transmit the sound waves from the transducer. The sound waves will harmlessly bounce off your heart to allow the heart images to be captured in real-time motion. These images will then be recorded.  You may need an IV to receive a medicine that improves the quality of the pictures. What happens after the procedure? You may return to your normal schedule including diet, activities, and medicines, unless your health care provider tells you otherwise. This information is not intended to replace advice given to you by your health care provider. Make sure you discuss any questions you have with your health care provider. Document Released: 10/15/2000 Document Revised: 06/05/2016 Document Reviewed: 06/25/2013 Elsevier Interactive Patient Education  2017 [employer].     If you need a refill on your cardiac medications before your next appointment, please call your pharmacy.      Elson Clan, PA-C  04/19/2018 1:09 PM    [facility] [address] [place], [place], [place]  [address] Phone: [phone]; Fax: [phone]

## 2018-04-19 ENCOUNTER — Encounter (INDEPENDENT_AMBULATORY_CARE_PROVIDER_SITE_OTHER): Payer: Self-pay

## 2018-04-19 ENCOUNTER — Ambulatory Visit: Payer: BLUE CROSS/BLUE SHIELD | Admitting: Physician Assistant

## 2018-04-19 ENCOUNTER — Encounter: Payer: Self-pay | Admitting: Physician Assistant

## 2018-04-19 VITALS — BP 190/98 | HR 69 | Ht 64.0 in | Wt 165.0 lb

## 2018-04-19 DIAGNOSIS — I10 Essential (primary) hypertension: Secondary | ICD-10-CM

## 2018-04-19 DIAGNOSIS — E785 Hyperlipidemia, unspecified: Secondary | ICD-10-CM | POA: Diagnosis not present

## 2018-04-19 DIAGNOSIS — Z794 Long term (current) use of insulin: Secondary | ICD-10-CM | POA: Diagnosis not present

## 2018-04-19 DIAGNOSIS — E119 Type 2 diabetes mellitus without complications: Secondary | ICD-10-CM

## 2018-04-19 MED ORDER — CHLORTHALIDONE 25 MG PO TABS: 25.0000 mg | ORAL_TABLET | Freq: Every day | ORAL | 3 refills | Status: DC

## 2018-04-19 NOTE — Patient Instructions (Addendum)
Medication Instructions:  Your physician has recommended you make the following change in your medication:  1.  STOP the Hydrochlorothiazide  2.  START Chlorthalidone 25 mg taking 1 tablet daily  Labwork: TODAY:  RENIN ALDOSTERONE LEVEL  AT PATIENTS CONVENIENCE:   AM CORTISOL LEVEL  Testing/Procedures: Your physician has requested that you have an echocardiogram. Echocardiography is a painless test that uses sound waves to create images of your heart. It provides your doctor with information about the size and shape of your heart and how well your heart's chambers and valves are working. This procedure takes approximately one hour. There are no restrictions for this procedure.  Your physician has requested that you have a renal artery duplex. During this test, an ultrasound is used to evaluate blood flow to the kidneys. Allow one hour for this exam. Do not eat after midnight the day before and avoid carbonated beverages. Take your medications as you usually do.    Follow-Up: Your physician recommends that you schedule a follow-up appointment in: 1st AVAILABLE WITH DR. [place] AS NEW CARDIOLOGIST FOR HYPERTENSION    Any Other Special Instructions Will Be Listed Below (If Applicable).  Two Gram Sodium Diet 2000 mg  What is Sodium? Sodium is a mineral found naturally in many foods. The most significant source of sodium in the diet is table salt, which is about 40% sodium.  Processed, convenience, and preserved foods also contain a large amount of sodium.  The body needs only 500 mg of sodium daily to function,  A normal diet provides more than enough sodium even if you do not use salt.  Why Limit Sodium? A build up of sodium in the body can cause thirst, increased blood pressure, shortness of breath, and water retention.  Decreasing sodium in the diet can reduce edema and risk of heart attack or stroke associated with high blood pressure.  Keep in mind that there are many other factors  involved in these health problems.  Heredity, obesity, lack of exercise, cigarette smoking, stress and what you eat all play a role.  General Guidelines:  Do not add salt at the table or in cooking.  One teaspoon of salt contains over 2 grams of sodium.  Read food labels  Avoid processed and convenience foods  Ask your dietitian before eating any foods not dicussed in the menu planning guidelines  Consult your physician if you wish to use a salt substitute or a sodium containing medication such as antacids.  Limit milk and milk products to 16 oz (2 cups) per day.  Shopping Hints:  READ LABELS!! "Dietetic" does not necessarily mean low sodium.  Salt and other sodium ingredients are often added to foods during processing.   Menu Planning Guidelines Food Group Choose More Often Avoid  Beverages (see also the milk group All fruit juices, low-sodium, salt-free vegetables juices, low-sodium carbonated beverages Regular vegetable or tomato juices, commercially softened water used for drinking or cooking  Breads and Cereals Enriched white, wheat, rye and pumpernickel bread, hard rolls and dinner rolls; muffins, cornbread and waffles; most dry cereals, cooked cereal without added salt; unsalted crackers and breadsticks; low sodium or homemade bread crumbs Bread, rolls and crackers with salted tops; quick breads; instant hot cereals; pancakes; commercial bread stuffing; self-rising flower and biscuit mixes; regular bread crumbs or cracker crumbs  Desserts and Sweets Desserts and sweets mad with mild should be within allowance Instant pudding mixes and cake mixes  Fats Butter or margarine; vegetable oils; unsalted salad dressings,  regular salad dressings limited to 1 Tbs; light, sour and heavy cream Regular salad dressings containing bacon fat, bacon bits, and salt pork; snack dips made with instant soup mixes or processed cheese; salted nuts  Fruits Most fresh, frozen and canned fruits Fruits  processed with salt or sodium-containing ingredient (some dried fruits are processed with sodium sulfites        Vegetables Fresh, frozen vegetables and low- sodium canned vegetables Regular canned vegetables, sauerkraut, pickled vegetables, and others prepared in brine; frozen vegetables in sauces; vegetables seasoned with ham, bacon or salt pork  Condiments, Sauces, Miscellaneous  Salt substitute with physician's approval; pepper, herbs, spices; vinegar, lemon or lime juice; hot pepper sauce; garlic powder, onion powder, low sodium soy sauce (1 Tbs.); low sodium condiments (ketchup, chili sauce, mustard) in limited amounts (1 tsp.) fresh ground horseradish; unsalted tortilla chips, pretzels, potato chips, popcorn, salsa (1/4 cup) Any seasoning made with salt including garlic salt, celery salt, onion salt, and seasoned salt; sea salt, rock salt, kosher salt; meat tenderizers; monosodium glutamate; mustard, regular soy sauce, barbecue, sauce, chili sauce, teriyaki sauce, steak sauce, Worcestershire sauce, and most flavored vinegars; canned gravy and mixes; regular condiments; salted snack foods, olives, picles, relish, horseradish sauce, catsup   Food preparation: Try these seasonings Meats:    Pork Sage, onion Serve with applesauce  Chicken Poultry seasoning, thyme, parsley Serve with cranberry sauce  Lamb Curry powder, rosemary, garlic, thyme Serve with mint sauce or jelly  Veal Marjoram, basil Serve with current jelly, cranberry sauce  Beef Pepper, bay leaf Serve with dry mustard, unsalted chive butter  Fish [place] leaf, dill Serve with unsalted lemon butter, unsalted parsley butter  Vegetables:    Asparagus Lemon juice   Broccoli Lemon juice   Carrots Mustard dressing parsley, mint, nutmeg, glazed with unsalted butter and sugar   Green beans Marjoram, lemon juice, nutmeg,dill seed   Tomatoes Basil, marjoram, onion   Spice /blend for [employer]" 4 tsp ground thyme 1 tsp ground sage 3 tsp  ground rosemary 4 tsp ground marjoram   Test your knowledge 1. A product that says "Salt Free" may still contain sodium. True or False 2. Garlic Powder and Hot Pepper Sauce an be used as alternative seasonings.True or False 3. Processed foods have more sodium than fresh foods.  True or False 4. Canned Vegetables have less sodium than froze True or False  WAYS TO DECREASE YOUR SODIUM INTAKE 1. Avoid the use of added salt in cooking and at the table.  Table salt (and other prepared seasonings which contain salt) is probably one of the greatest sources of sodium in the diet.  Unsalted foods can gain flavor from the sweet, sour, and butter taste sensations of herbs and spices.  Instead of using salt for seasoning, try the following seasonings with the foods listed.  Remember: how you use them to enhance natural food flavors is limited only by your creativity... Allspice-Meat, fish, eggs, fruit, peas, red and yellow vegetables Almond Extract-Fruit baked goods Anise Seed-Sweet breads, fruit, carrots, beets, cottage cheese, cookies (tastes like licorice) Basil-Meat, fish, eggs, vegetables, rice, vegetables salads, soups, sauces Bay Leaf-Meat, fish, stews, poultry Burnet-Salad, vegetables (cucumber-like flavor) Caraway Seed-Bread, cookies, cottage cheese, meat, vegetables, cheese, rice Cardamon-Baked goods, fruit, soups Celery Powder or seed-Salads, salad dressings, sauces, meatloaf, soup, bread.Do not use  celery salt Chervil-Meats, salads, fish, eggs, vegetables, cottage cheese (parsley-like flavor) Chili Power-Meatloaf, chicken cheese, corn, eggplant, egg dishes Chives-Salads cottage cheese, egg dishes, soups, vegetables, sauces Cilantro-Salsa,  casseroles Cinnamon-Baked goods, fruit, pork, lamb, chicken, carrots Cloves-Fruit, baked goods, fish, pot roast, green beans, beets, carrots Coriander-Pastry, cookies, meat, salads, cheese (lemon-orange flavor) Cumin-Meatloaf, fish,cheese, eggs,  cabbage,fruit pie (caraway flavor) [employer], fruit, eggs, fish, poultry, cottage cheese, vegetables Dill Seed-Meat, cottage cheese, poultry, vegetables, fish, salads, bread Fennel Seed-Bread, cookies, apples, pork, eggs, fish, beets, cabbage, cheese, Licorice-like flavor Garlic-(buds or powder) Salads, meat, poultry, fish, bread, butter, vegetables, potatoes.Do not  use garlic salt Ginger-Fruit, vegetables, baked goods, meat, fish, poultry Horseradish Root-Meet, vegetables, butter Lemon Juice or Extract-Vegetables, fruit, tea, baked goods, fish salads Mace-Baked goods fruit, vegetables, fish, poultry (taste like nutmeg) Maple Extract-Syrups Marjoram-Meat, chicken, fish, vegetables, breads, green salads (taste like Sage) Mint-Tea, lamb, sherbet, vegetables, desserts, carrots, cabbage Mustard, Dry or Seed-Cheese, eggs, meats, vegetables, poultry Nutmeg-Baked goods, fruit, chicken, eggs, vegetables, desserts Onion Powder-Meat, fish, poultry, vegetables, cheese, eggs, bread, rice salads (Do not use   Onion salt) Orange Extract-Desserts, baked goods Oregano-Pasta, eggs, cheese, onions, pork, lamb, fish, chicken, vegetables, green salads Paprika-Meat, fish, poultry, eggs, cheese, vegetables Parsley Flakes-Butter, vegetables, meat fish, poultry, eggs, bread, salads (certain forms may   Contain sodium Pepper-Meat fish, poultry, vegetables, eggs Peppermint Extract-Desserts, baked goods Poppy Seed-Eggs, bread, cheese, fruit dressings, baked goods, noodles, vegetables, cottage  [employer], poultry, meat, fish, cauliflower, turnips,eggs bread Saffron-Rice, bread, veal, chicken, fish, eggs Sage-Meat, fish, poultry, onions, eggplant, tomateos, pork, stews Savory-Eggs, salads, poultry, meat, rice, vegetables, soups, pork Tarragon-Meat, poultry, fish, eggs, butter, vegetables (licorice-like flavor)  Thyme-Meat, poultry, fish, eggs, vegetables,  (clover-like flavor), sauces, soups Tumeric-Salads, butter, eggs, fish, rice, vegetables (saffron-like flavor) Vanilla Extract-Baked goods, candy Vinegar-Salads, vegetables, meat marinades Walnut Extract-baked goods, candy  2. Choose your Foods Wisely   The following is a list of foods to avoid which are high in sodium:  Meats-Avoid all smoked, canned, salt cured, dried and kosher meat and fish as well as Anchovies   [employer] meats:Bologna, Liverwurst, Pastrami Canned meat or fish  Marinated herring Caviar    Pepperoni Corned Beef   Pizza Dried chipped beef  Salami Frozen breaded fish or meat Salt pork Frankfurters or hot dogs  Sardines Gefilte fish   Sausage Ham (boiled ham, Proscuitto Smoked butt    spiced ham)   Spam      TV Dinners Vegetables Canned vegetables (Regular) Relish Canned mushrooms  Sauerkraut Olives    Tomato juice Pickles  Bakery and Dessert Products Canned puddings  Cream pies Cheesecake   Decorated cakes Cookies  Beverages/Juices Tomato juice, regular  Gatorade   V-8 vegetable juice, regular  Breads and Cereals Biscuit mixes   Salted potato chips, corn chips, pretzels Bread stuffing mixes  Salted crackers and rolls Pancake and waffle mixes Self-rising flour  Seasonings Accent    Meat sauces Barbecue sauce  Meat tenderizer Catsup    Monosodium glutamate (MSG) Celery salt   Onion salt Chili sauce   Prepared mustard Garlic salt   Salt, seasoned salt, sea salt Gravy mixes   Soy sauce Horseradish   Steak sauce Ketchup   Tartar sauce Lite salt    Teriyaki sauce Marinade mixes   Worcestershire sauce  Others Baking powder   Cocoa and cocoa mixes Baking soda   Commercial casserole mixes Candy-caramels, chocolate  Dehydrated soups    Bars, fudge,nougats  Instant rice and pasta mixes Canned broth or soup  Maraschino cherries Cheese, aged and processed cheese and cheese spreads  Learning Assessment Quiz  Indicated T (for True)  or F  (for False) for each of the following statements:  1. _____ Fresh fruits and vegetables and unprocessed grains are generally low in sodium 2. _____ Water may contain a considerable amount of sodium, depending on the source 3. _____ You can always tell if a food is high in sodium by tasting it 4. _____ Certain laxatives my be high in sodium and should be avoided unless prescribed   by a physician or pharmacist 5. _____ Salt substitutes may be used freely by anyone on a sodium restricted diet 6. _____ Sodium is present in table salt, food additives and as a natural component of   most foods 7. _____ Table salt is approximately 90% sodium 8. _____ Limiting sodium intake may help prevent excess fluid accumulation in the body 9. _____ On a sodium-restricted diet, seasonings such as bouillon soy sauce, and    cooking wine should be used in place of table salt 10. _____ On an ingredient list, a product which lists monosodium glutamate as the first   ingredient is an appropriate food to include on a low sodium diet  Circle the best answer(s) to the following statements (Hint: there may be more than one correct answer)  11. On a low-sodium diet, some acceptable snack items are:    A. Olives  F. Bean dip   K. Grapefruit juice    B. Salted Pretzels G. Commercial Popcorn   L. Canned peaches    C. Carrot Sticks  H. Bouillon   M. Unsalted nuts   D. [place] fries  I. Peanut butter crackers N. Salami   E. Sweet pickles J. Tomato Juice   O. Pizza  12.  Seasonings that may be used freely on a reduced - sodium diet include   A. Lemon wedges F.Monosodium glutamate K. Celery seed    B.Soysauce   G. Pepper   L. Mustard powder   C. Sea salt  H. Cooking wine  M. Onion flakes   D. Vinegar  E. Prepared horseradish N. Salsa   E. Sage   J. Worcestershire sauce  O. Chutney  Echocardiogram An echocardiogram, or echocardiography, uses sound waves (ultrasound) to produce an image of your heart. The  echocardiogram is simple, painless, obtained within a short period of time, and offers valuable information to your health care provider. The images from an echocardiogram can provide information such as:  Evidence of coronary artery disease (CAD).  Heart size.  Heart muscle function.  Heart valve function.  Aneurysm detection.  Evidence of a past heart attack.  Fluid buildup around the heart.  Heart muscle thickening.  Assess heart valve function.  Tell a health care provider about:  Any allergies you have.  All medicines you are taking, including vitamins, herbs, eye drops, creams, and over-the-counter medicines.  Any problems you or family members have had with anesthetic medicines.  Any blood disorders you have.  Any surgeries you have had.  Any medical conditions you have.  Whether you are pregnant or may be pregnant. What happens before the procedure? No special preparation is needed. Eat and drink normally. What happens during the procedure?  In order to produce an image of your heart, gel will be applied to your chest and a wand-like tool (transducer) will be moved over your chest. The gel will help transmit the sound waves from the transducer. The sound waves will harmlessly bounce off your heart to allow the heart images to be captured in real-time motion. These images will then be  recorded.  You may need an IV to receive a medicine that improves the quality of the pictures. What happens after the procedure? You may return to your normal schedule including diet, activities, and medicines, unless your health care provider tells you otherwise. This information is not intended to replace advice given to you by your health care provider. Make sure you discuss any questions you have with your health care provider. Document Released: 10/15/2000 Document Revised: 06/05/2016 Document Reviewed: 06/25/2013 Elsevier Interactive Patient Education  2017 [employer].     If you need a refill on your cardiac medications before your next appointment, please call your pharmacy.

## 2018-04-21 ENCOUNTER — Telehealth: Payer: Self-pay | Admitting: Physician Assistant

## 2018-04-21 NOTE — Telephone Encounter (Signed)
Called and left VM for pt to call the office and reschedule her appt 05/08/18 with Barnett AbuWiseman for for to discuss treatment for hot flushes. When she calls back, please reschedule her with Barnett AbuWiseman at her convenience. Thanks!

## 2018-04-23 LAB — ALDOSTERONE + RENIN ACTIVITY W/ RATIO: ALDOSTERONE: 17.5 ng/dL (ref 0.0–30.0)

## 2018-04-23 LAB — CORTISOL-AM, BLOOD: Cortisol - AM: 9.6 ug/dL (ref 6.2–19.4)

## 2018-04-26 ENCOUNTER — Ambulatory Visit (HOSPITAL_COMMUNITY)
Admission: RE | Admit: 2018-04-26 | Discharge: 2018-04-26 | Disposition: A | Discharge status: Home / Self Care | Payer: BLUE CROSS/BLUE SHIELD | Source: Ambulatory Visit | Attending: Cardiovascular Disease | Admitting: Cardiovascular Disease

## 2018-04-26 ENCOUNTER — Ambulatory Visit (HOSPITAL_BASED_OUTPATIENT_CLINIC_OR_DEPARTMENT_OTHER): Payer: BLUE CROSS/BLUE SHIELD

## 2018-04-26 ENCOUNTER — Encounter: Payer: Self-pay | Admitting: Endocrinology

## 2018-04-26 ENCOUNTER — Ambulatory Visit: Payer: BLUE CROSS/BLUE SHIELD | Admitting: Endocrinology

## 2018-04-26 ENCOUNTER — Other Ambulatory Visit: Payer: Self-pay

## 2018-04-26 VITALS — BP 130/88 | HR 69 | Wt 164.8 lb

## 2018-04-26 DIAGNOSIS — I10 Essential (primary) hypertension: Secondary | ICD-10-CM | POA: Diagnosis present

## 2018-04-26 DIAGNOSIS — I119 Hypertensive heart disease without heart failure: Secondary | ICD-10-CM | POA: Diagnosis not present

## 2018-04-26 DIAGNOSIS — Z794 Long term (current) use of insulin: Secondary | ICD-10-CM

## 2018-04-26 DIAGNOSIS — E119 Type 2 diabetes mellitus without complications: Secondary | ICD-10-CM | POA: Diagnosis not present

## 2018-04-26 DIAGNOSIS — I701 Atherosclerosis of renal artery: Secondary | ICD-10-CM | POA: Diagnosis not present

## 2018-04-26 DIAGNOSIS — E785 Hyperlipidemia, unspecified: Secondary | ICD-10-CM | POA: Diagnosis not present

## 2018-04-26 LAB — POCT GLYCOSYLATED HEMOGLOBIN (HGB A1C): HEMOGLOBIN A1C: 9.2 % — AB (ref 4.0–5.6)

## 2018-04-26 LAB — ECHOCARDIOGRAM COMPLETE: Weight: 2636.8 oz

## 2018-04-26 MED ORDER — SEMAGLUTIDE(0.25 OR 0.5MG/DOS) 2 MG/1.5ML ~~LOC~~ SOPN
0.2500 mg | PEN_INJECTOR | SUBCUTANEOUS | 11 refills | Status: DC
Start: 2018-04-26 — End: 2018-07-05

## 2018-04-26 NOTE — Progress Notes (Signed)
Subjective:    Patient ID: Yolanda Herrera, female    DOB: January 01, 1971, 47 y.o.   MRN: [id number]  HPI pt is referred by Benjiman CoreBrittany Wiseman, PA, for diabetes.  Pt states DM was dx'ed in 2011; she has mild if any neuropathy of the lower extremities; she is unaware of any associated chronic complications; she has been on insulin since 2015; pt says her diet and exercise are good; she has never had GDM, pancreatitis, pancreatic surgery, severe hypoglycemia or DKA.  He had nonketotic hyperosmolar hyperglycemic state in 2015.  She takes lantus and 2 oral meds.  She says cbg's vary from 93-163.  It is in general higher as the day goes on.   Past Medical History:  Diagnosis Date  . Diabetes mellitus without complication (HCC)   . Hypertension     Past Surgical History:  Procedure Laterality Date  . CESAREAN SECTION    . UTERINE FIBROID SURGERY      Social History   Socioeconomic History  . Marital status: Married    Spouse name: Not on file  . Number of children: 1  . Years of education: some college   . Highest education level: Not on file  Occupational History  . 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 Financial resource strain: Not hard at all  . Food insecurity:    Worry: Never true    Inability: Never true  . Transportation needs:    Medical: No    Non-medical: No  Tobacco Use  . Smoking status: Never Smoker  . Smokeless tobacco: Never Used  Substance and Sexual Activity  . Alcohol use: No  . Drug use: Never  . Sexual activity: Not Currently  Lifestyle  . Physical activity:    Days per week: 1 day    Minutes per session: 90 min  . Stress: Not at all  Relationships  . Social connections:    Talks on phone: More than three times a week    Gets together: Once a week    Attends religious service: More than 4 times per year    Active member of club or organization: Yes    Attends meetings of clubs or organizations: More than 4 times per year    Relationship status: Married  . Intimate partner violence:    Fear of current or ex partner: No    Emotionally abused: No    Physically abused: No    Forced sexual activity: No  Other Topics Concern  . Not on file  Social History Narrative   Pt is from [place]. Moved here in 2001. 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     Current Outpatient Medications on File Prior to Visit  Medication Sig Dispense Refill  . amLODipine (NORVASC) 10 MG tablet Take 1 tablet (10 mg total) by mouth daily. 90 tablet 0  . atorvastatin (LIPITOR) 20 MG tablet Take 1 tablet (20 mg total) by mouth daily. 90 tablet 3  . chlorthalidone (HYGROTON) 25 MG tablet Take 1 tablet (25 mg total) by mouth daily. 90 tablet 3  . Empagliflozin-metFORMIN HCl ER 03-999 MG TB24 Take 1 tablet by mouth 2 (two) times daily with a meal. 180 tablet 0  . insulin glargine (LANTUS) 100 UNIT/ML injection Inject 0.4 mLs (40 Units total) into the skin daily. 10 mL 2  . lisinopril (PRINIVIL,ZESTRIL) 40 MG tablet Take 1 tablet (40 mg total) by mouth  daily. 90 tablet 0  . Metoprolol Succinate 100 MG CS24 Take 100 mg by mouth daily. 90 capsule 0  . UBIQUINONE PO Take 200 mg by mouth daily.     No current facility-administered medications on file prior to visit.     No Known Allergies  Family History  Problem Relation Age of Onset  . Diabetes Mother   . Diabetes Father   . Kidney disease Father   . Hypertension Father   . Hypertension Sister   . Stroke Sister     BP 130/88 (BP Location: Left Arm, Patient Position: Sitting, Cuff Size: Normal)   Pulse 69   Wt 164 lb 12.8 oz (74.8 kg)   LMP 01/18/2018 (Approximate)   SpO2 98%   BMI 28.29 kg/m     Review of Systems denies blurry vision, headache, chest pain, sob, n/v, urinary frequency, muscle cramps, excessive diaphoresis, memory loss, depression, cold intolerance, rhinorrhea, and easy bruising. She has lost a few lbs.         Objective:   Physical  Exam VS: see vs page GEN: no distress HEAD: head: no deformity eyes: no periorbital swelling, no proptosis external nose and ears are normal mouth: no lesion seen NECK: supple, thyroid is not enlarged CHEST WALL: no deformity LUNGS: clear to auscultation CV: reg rate and rhythm, no murmur ABD: abdomen is soft, nontender.  no hepatosplenomegaly.  not distended.  no hernia MUSCULOSKELETAL: muscle bulk and strength are grossly normal.  no obvious joint swelling.  gait is normal and steady EXTEMITIES: no deformity.  no ulcer on the feet.  feet are of normal color and temp.  no edema PULSES: dorsalis pedis intact bilat.  no carotid bruit NEURO:  cn 2-12 grossly intact.   readily moves all 4's.  sensation is intact to touch on the feet SKIN:  Normal texture and temperature.  No rash or suspicious lesion is visible.   NODES:  None palpable at the neck PSYCH: alert, well-oriented.  Does not appear anxious nor depressed.      Lab Results  Component Value Date   HGBA1C 9.2 (A) 04/26/2018   Lab Results  Component Value Date   CREATININE 0.92 01/30/2018   BUN 9 01/30/2018   NA 145 (H) 01/30/2018   K 3.5 01/30/2018   CL 97 01/30/2018   CO2 21 01/30/2018   Lab Results  Component Value Date   TSH 0.985 01/30/2018   I have reviewed outside records, and summarized: Pt was noted to have elevated a1c, and referred here.  She was also seen by cardiol for HTN. She requires multiple meds for this.      Assessment & Plan:  Insulin-requiring type 2 DM, new to me: she needs increased rx   Patient Instructions  good diet and exercise significantly improve the control of your diabetes.  please let me know if you wish to be referred to a dietician.  high blood sugar is very risky to your health.  you should see an eye doctor and dentist every year.  It is very important to get all recommended vaccinations.  Controlling your blood pressure and cholesterol drastically reduces the damage diabetes  does to your body.  Those who smoke should quit.  Please discuss these with your doctor.  check your blood sugar twice a day.  vary the time of day when you check, between before the 3 meals, and at bedtime.  also check if you have symptoms of your blood sugar being too high or  too low.  please keep a record of the readings and bring it to your next appointment here (or you can bring the meter itself).  You can write it on any piece of paper.  please call us sooner if your blood sugar goes below 70, or if you have a lot of readings over 200.   We will need to take this complex situation in stages. I have sent a prescription to your pharmacy, to add "Ozempic."  Please reduce the lantus to 10 units each morning.   Please continue the same other diabetes medication.   Please call or message [place] next week, to tell us how the blood sugar is doing. Please come back for a follow-up appointment in 1 month.

## 2018-04-26 NOTE — Patient Instructions (Addendum)
good diet and exercise significantly improve the control of your diabetes.  please let me know if you wish to be referred to a dietician.  high blood sugar is very risky to your health.  you should see an eye doctor and dentist every year.  It is very important to get all recommended vaccinations.  Controlling your blood pressure and cholesterol drastically reduces the damage diabetes does to your body.  Those who smoke should quit.  Please discuss these with your doctor.  check your blood sugar twice a day.  vary the time of day when you check, between before the 3 meals, and at bedtime.  also check if you have symptoms of your blood sugar being too high or too low.  please keep a record of the readings and bring it to your next appointment here (or you can bring the meter itself).  You can write it on any piece of paper.  please call us sooner if your blood sugar goes below 70, or if you have a lot of readings over 200.   We will need to take this complex situation in stages. I have sent a prescription to your pharmacy, to add "Ozempic."  Please reduce the lantus to 10 units each morning.   Please continue the same other diabetes medication.   Please call or message us next week, to tell us how the blood sugar is doing. Please come back for a follow-up appointment in 1 month.

## 2018-04-27 ENCOUNTER — Ambulatory Visit: Payer: BLUE CROSS/BLUE SHIELD | Admitting: Cardiovascular Disease

## 2018-05-08 ENCOUNTER — Telehealth: Payer: Self-pay | Admitting: Emergency Medicine

## 2018-05-08 ENCOUNTER — Ambulatory Visit: Payer: BLUE CROSS/BLUE SHIELD | Admitting: Physician Assistant

## 2018-05-08 NOTE — Telephone Encounter (Signed)
Pharmacy called but was very hard to understand gentleman on the phone. He stated he was requesting a prior authorization for the Semaglutide (OZEMPIC) 0.25 or 0.5 MG/DOSE SOPN for patient. Thanks.

## 2018-05-09 ENCOUNTER — Encounter: Payer: Self-pay | Admitting: Cardiovascular Disease

## 2018-05-09 ENCOUNTER — Ambulatory Visit: Payer: BLUE CROSS/BLUE SHIELD | Admitting: Cardiovascular Disease

## 2018-05-09 VITALS — BP 166/88 | HR 75 | Ht 63.0 in | Wt 163.8 lb

## 2018-05-09 DIAGNOSIS — E785 Hyperlipidemia, unspecified: Secondary | ICD-10-CM | POA: Diagnosis not present

## 2018-05-09 DIAGNOSIS — Z79899 Other long term (current) drug therapy: Secondary | ICD-10-CM

## 2018-05-09 DIAGNOSIS — I10 Essential (primary) hypertension: Secondary | ICD-10-CM | POA: Diagnosis not present

## 2018-05-09 MED ORDER — SPIRONOLACTONE 25 MG PO TABS: 25.0000 mg | ORAL_TABLET | Freq: Every day | ORAL | 3 refills | Status: DC

## 2018-05-09 NOTE — Progress Notes (Signed)
Cardiology Office Note   Date:  05/09/2018   ID:  Yolanda Herrera, DOB [birth year], MRN [id number]  PCP:  Magdalene River, PA-C  Cardiologist:  Will be Dr. Duke Salvia  No chief complaint on file.     History of Present Illness: Yolanda Herrera is a 47 y.o. female who was referred by Jacolyn Reedy for evaluation of refractory hypertension and renal artery stenosis.  The patient is originally from [place] and came to the [place] in 2001.  She reports a very strong family history of difficult to control hypertension as well as diabetes.  Her sister had a stroke at the age of 48.  She is not a smoker. The patient was diagnosed with hypertension in her 25s and this has been very difficult to control.  In 2012, she delivered her baby at 36 weeks due to elevated blood pressure.  She has been on multiple blood pressure medications with suboptimal control.  There is no history of sleep apnea.  No use of NSAIDs.  She denies any chest pain or shortness of breath.  No palpitations. She had renal artery duplex which showed only mild right renal artery stenosis with a peak velocity of 201. Her labs showed elevated aldosterone to renin ratio normal cortisol level.   Echocardiogram showed normal LV systolic function with moderate LVH and grade 1 diastolic dysfunction.     Past Medical History:  Diagnosis Date  . Diabetes mellitus without complication (HCC)   . Hypertension     Past Surgical History:  Procedure Laterality Date  . CESAREAN SECTION    . UTERINE FIBROID SURGERY       Current Outpatient Medications  Medication Sig Dispense Refill  . amLODipine (NORVASC) 10 MG tablet Take 1 tablet (10 mg total) by mouth daily. 90 tablet 0  . atorvastatin (LIPITOR) 20 MG tablet Take 1 tablet (20 mg total) by mouth daily. 90 tablet 3  . chlorthalidone (HYGROTON) 25 MG tablet Take 1 tablet (25 mg total) by mouth daily. 90 tablet 3  . Empagliflozin-metFORMIN HCl ER 03-999 MG TB24 Take 1  tablet by mouth 2 (two) times daily with a meal. 180 tablet 0  . insulin glargine (LANTUS) 100 UNIT/ML injection Inject 0.4 mLs (40 Units total) into the skin daily. 10 mL 2  . lisinopril (PRINIVIL,ZESTRIL) 40 MG tablet Take 1 tablet (40 mg total) by mouth daily. 90 tablet 0  . Metoprolol Succinate 100 MG CS24 Take 100 mg by mouth daily. 90 capsule 0  . Semaglutide (OZEMPIC) 0.25 or 0.5 MG/DOSE SOPN Inject 0.25 mg into the skin once a week. 4 pen 11  . UBIQUINONE PO Take 200 mg by mouth daily.    Marland Kitchen spironolactone (ALDACTONE) 25 MG tablet Take 1 tablet (25 mg total) by mouth daily. 90 tablet 3   No current facility-administered medications for this visit.     Allergies:   Patient has no known allergies.    Social History:  The patient  reports that she has never smoked. She has never used smokeless tobacco. She reports that she does not drink alcohol or use drugs.   Family History:  The patient's family history includes Diabetes in her father and mother; Hypertension in her father and sister; Kidney disease in her father; Stroke in her sister.    ROS:  Please see the history of present illness.   Otherwise, review of systems are positive for none.   All other systems are reviewed and negative.    PHYSICAL  EXAM: VS:  BP (!) 166/88   Pulse 75   Ht 5\' 3"  (1.6 m)   Wt 163 lb 12.8 oz (74.3 kg)   LMP 01/18/2018 (Approximate)   SpO2 96%   BMI 29.02 kg/m  , BMI Body mass index is 29.02 kg/m. GEN: Well nourished, well developed, in no acute distress  HEENT: normal  Neck: no JVD, carotid bruits, or masses Cardiac: RRR; no murmurs, rubs, or gallops,no edema  Respiratory:  clear to auscultation bilaterally, normal work of breathing GI: soft, nontender, nondistended, + BS MS: no deformity or atrophy  Skin: warm and dry, no rash Neuro:  Strength and sensation are intact Psych: euthymic mood, full affect   EKG:  EKG is not ordered today.   Recent Labs: 01/30/2018: ALT 32; BUN 9;  Creatinine, Ser 0.92; Hemoglobin 13.7; Platelets 234; Potassium 3.5; Sodium 145; TSH 0.985    Lipid Panel    Component Value Date/Time   CHOL 171 01/30/2018 1119   TRIG 133 01/30/2018 1119   HDL 43 01/30/2018 1119   CHOLHDL 4.0 01/30/2018 1119   LDLCALC 101 (H) 01/30/2018 1119      Wt Readings from Last 3 Encounters:  05/09/18 163 lb 12.8 oz (74.3 kg)  04/26/18 164 lb 12.8 oz (74.8 kg)  04/19/18 165 lb (74.8 kg)      No flowsheet data found.    ASSESSMENT AND PLAN:  1.  Refractory hypertension: Based on her family history and personal diagnosis at an early age, I suspect that she has essential hypertension with very difficult to control blood pressure.  I reviewed renal artery duplex results with her and that showed only mild unilateral renal artery stenosis which is not responsible for elevated blood pressure. Her labs did show very elevated aldosterone to renin ratio and certainly hyperaldosteronism whether primary or secondary could be contributing to elevated blood pressure.  I elected to add spironolactone 25 mg once daily.  Continue other medications and check basic metabolic profile in 1 week.  The patient has a follow-up appointment with Dr. Duke Salviaandolph next month.  Another consideration would be to switch metoprolol to carvedilol.  2.  Hyperlipidemia: Currently on atorvastatin.  Disposition:   FU with Dr. Duke Salviaandolph in August  Signed,  Muhammad Arida, MD  05/09/2018 6:16 PM    [facility]

## 2018-05-09 NOTE — Patient Instructions (Signed)
Medication Instructions:  Your physician has recommended you make the following change in your medication:  1) START Spirolactone 25mg  tablet - Take one tablet by mouth ONCE daily   Labwork: Your physician recommends that you return for lab work in: 1 week - BMET   Testing/Procedures: none  Follow-Up: Keep scheduled follow up with Dr. Duke Salviaandolph in August 2019  Any Other Special Instructions Will Be Listed Below (If Applicable).     If you need a refill on your cardiac medications before your next appointment, please call your pharmacy.

## 2018-05-10 NOTE — Telephone Encounter (Signed)
I have submitted PA via Cover My Meds & am waiting on a response.

## 2018-05-20 LAB — BASIC METABOLIC PANEL
BUN/Creatinine Ratio: 13 (ref 9–23)
BUN: 13 mg/dL (ref 6–24)
CO2: 28 mmol/L (ref 20–29)
CREATININE: 0.98 mg/dL (ref 0.57–1.00)
Calcium: 10.3 mg/dL — ABNORMAL HIGH (ref 8.7–10.2)
Chloride: 94 mmol/L — ABNORMAL LOW (ref 96–106)
GFR calc Af Amer: 79 mL/min/{1.73_m2} (ref >59)
GFR calc non Af Amer: 69 mL/min/{1.73_m2} (ref >59)
Glucose: 194 mg/dL — ABNORMAL HIGH (ref 65–99)
Potassium: 3.4 mmol/L — ABNORMAL LOW (ref 3.5–5.2)
Sodium: 139 mmol/L (ref 134–144)

## 2018-05-22 ENCOUNTER — Ambulatory Visit: Payer: BLUE CROSS/BLUE SHIELD | Admitting: Physician Assistant

## 2018-05-24 ENCOUNTER — Ambulatory Visit: Payer: BLUE CROSS/BLUE SHIELD | Admitting: Endocrinology

## 2018-05-25 ENCOUNTER — Telehealth: Payer: Self-pay

## 2018-05-25 ENCOUNTER — Telehealth: Payer: Self-pay | Admitting: Cardiovascular Disease

## 2018-05-25 MED ORDER — POTASSIUM CHLORIDE CRYS ER 20 MEQ PO TBCR: 20.0000 meq | EXTENDED_RELEASE_TABLET | Freq: Every day | ORAL | 1 refills | Status: DC

## 2018-05-25 NOTE — Telephone Encounter (Signed)
Pt retuning our call for lab results ° °Please call back ° °

## 2018-05-25 NOTE — Telephone Encounter (Signed)
Patient made aware of results and verbalized understanding. K-dur has been sent to her pharmacy.   Notes recorded by Iran OuchArida, Muhammad A, MD on 05/23/2018 at 8:13 AM EDT Mild hypokalemia. Add K-Dur 20 mEq once daily.

## 2018-05-25 NOTE — Telephone Encounter (Signed)
I spoke with Larita FifeJanessia from [facility] about PA needed for patient's ozempic. She gave me numbers for [employer] help desk# [phone], 3rd Party [phone] ID [id number] & Group # [id number]. I stated that I would do PA when I could & notify her when done. Patient was given medication, but pharmacy has not been paid due to it not being ran through insurance at the time patient picked up med.

## 2018-06-05 ENCOUNTER — Ambulatory Visit: Payer: BLUE CROSS/BLUE SHIELD | Admitting: Physician Assistant

## 2018-06-06 ENCOUNTER — Ambulatory Visit: Payer: BLUE CROSS/BLUE SHIELD | Admitting: Physician Assistant

## 2018-06-06 ENCOUNTER — Encounter: Payer: Self-pay | Admitting: Physician Assistant

## 2018-06-06 ENCOUNTER — Other Ambulatory Visit: Payer: Self-pay

## 2018-06-06 VITALS — BP 132/80 | HR 86 | Temp 98.0°F | Resp 16 | Ht 63.78 in | Wt 158.0 lb

## 2018-06-06 DIAGNOSIS — Z23 Encounter for immunization: Secondary | ICD-10-CM

## 2018-06-06 DIAGNOSIS — E119 Type 2 diabetes mellitus without complications: Secondary | ICD-10-CM | POA: Diagnosis not present

## 2018-06-06 DIAGNOSIS — E663 Overweight: Secondary | ICD-10-CM

## 2018-06-06 DIAGNOSIS — Z794 Long term (current) use of insulin: Secondary | ICD-10-CM

## 2018-06-06 DIAGNOSIS — I10 Essential (primary) hypertension: Secondary | ICD-10-CM

## 2018-06-06 MED ORDER — METOPROLOL SUCCINATE 100 MG PO CS24: 100.0000 mg | EXTENDED_RELEASE_CAPSULE | Freq: Every day | ORAL | 0 refills | Status: DC

## 2018-06-06 MED ORDER — LISINOPRIL 40 MG PO TABS: 40.0000 mg | ORAL_TABLET | Freq: Every day | ORAL | 0 refills | Status: DC

## 2018-06-06 MED ORDER — INSULIN GLARGINE 100 UNIT/ML ~~LOC~~ SOLN: 40.0000 [IU] | Freq: Every day | SUBCUTANEOUS | 2 refills | Status: DC

## 2018-06-06 MED ORDER — EMPAGLIFLOZIN-METFORMIN HCL ER 5-1000 MG PO TB24: 1.0000 | ORAL_TABLET | Freq: Two times a day (BID) | ORAL | 0 refills | Status: DC

## 2018-06-06 MED ORDER — AMLODIPINE BESYLATE 10 MG PO TABS: 10.0000 mg | ORAL_TABLET | Freq: Every day | ORAL | 0 refills | Status: DC

## 2018-06-06 NOTE — Progress Notes (Signed)
MRN: [id number] DOB: 21-Aug-1971  Subjective:   Yolanda Herrera is a 47 y.o. female presenting for follow up for medication refill.  Has followed up with endocrinology and cardiology for type 2 diabetes and hypertension.  T2DM: last seen endocrinology in Apr 12, 2018. They deceased lantus from 40 to 10 and added ozempic 0.25 MG weekly. Continued empagliflozin-metformin 5-1000mg  BID.  Today, reports that Ozempic  will give her nausea for the first couple od days but she is tolerating it will. It has helped with weight loss. She is eating smaller portions.  Does think she could go up on the Lantus.  Has been using 20 units instead of 10 units.  FBS have 130-150. After meals 170-172.   HTN: Last evaluated by hypertension clinic on 05/09/2018. Renal artery duplex with mild unilateral renal artery stenosis but cardiology does not think that this is responsible for elevated blood pressure. Suspects that patient has essential hypertension with very difficult to control blood pressure.  They added spironolactone to her medication regimen and changed HCTZ to chlorthalidone.  Today, she reports she is taking amlodipine 10 mg, chlorthalidone 25 mg, lisinopril 40 mg, metoprolol 100 mg, and spinal lactone 25 mg daily.  She is also taking potassium 20 mEq daily due to hypokalemia noted with cardiology.  Notes her blood pressure has been well controlled with this regimen.  Denies lightheadedness, dizziness, chronic headache, double vision, chest pain, shortness of breath, heart racing, palpitations, nausea, vomiting, abdominal pain, hematuria, lower leg swelling.     Nalda has a current medication list which includes the following prescription(s): amlodipine, atorvastatin, chlorthalidone, empagliflozin-metformin hcl er, insulin glargine, lisinopril, metoprolol succinate, potassium chloride sa, semaglutide, spironolactone, and ubiquinone. Also has No Known Allergies.  Karee  has a past medical history of Diabetes  mellitus without complication (HCC) and Hypertension. Also  has a past surgical history that includes Uterine fibroid surgery and Cesarean section.    Social History   Socioeconomic History  . Marital status: Married    Spouse name: Not on file  . Number of children: 1  . Years of education: some college   . Highest education level: Not on file  Occupational History  . 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 Financial resource strain: Not hard at all  . Food insecurity:    Worry: Never true    Inability: Never true  . Transportation needs:    Medical: No    Non-medical: No  Tobacco Use  . Smoking status: Never Smoker  . Smokeless tobacco: Never Used  Substance and Sexual Activity  . Alcohol use: No  . Drug use: Never  . Sexual activity: Not Currently  Lifestyle  . Physical activity:    Days per week: 1 day    Minutes per session: 90 min  . Stress: Not at all  Relationships  . Social connections:    Talks on phone: More than three times a week    Gets together: Once a week    Attends religious service: More than 4 times per year    Active member of club or organization: Yes    Attends meetings of clubs or organizations: More than 4 times per year    Relationship status: Married  . Intimate partner violence:    Fear of current or ex partner: No    Emotionally abused: No    Physically abused: No    Forced sexual activity: No  Other Topics Concern  .  Not on file  Social History Narrative   Pt is from [place]. Moved here in 2001. 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     Objective:   Vitals: BP 132/80   Pulse 86   Temp 98 F (36.7 C) (Oral)   Resp 16   Ht 5' 3.78" (1.62 m)   Wt 158 lb (71.7 kg)   LMP 01/18/2018 (Approximate)   SpO2 99%   BMI 27.31 kg/m   Physical Exam  Constitutional: She is oriented to person, place, and time. She appears well-developed and well-nourished. No distress.  HENT:  Head:  Normocephalic and atraumatic.  Mouth/Throat: Uvula is midline, oropharynx is clear and moist and mucous membranes are normal.  Eyes: Pupils are equal, round, and reactive to light. Conjunctivae and EOM are normal.  Neck: Normal range of motion.  Cardiovascular: Normal rate, regular rhythm, normal heart sounds and intact distal pulses.  Pulmonary/Chest: Effort normal and breath sounds normal. She has no wheezes. She has no rhonchi. She has no rales.  Musculoskeletal:       Right lower leg: She exhibits no swelling.       Left lower leg: She exhibits no swelling.  Neurological: She is alert and oriented to person, place, and time.  Skin: Skin is warm and dry.  Psychiatric: She has a normal mood and affect.  Vitals reviewed.   No results found for this or any previous visit (from the past 24 hour(s)).   Wt Readings from Last 3 Encounters:  06/06/18 158 lb (71.7 kg)  05/09/18 163 lb 12.8 oz (74.3 kg)  04/26/18 164 lb 12.8 oz (74.8 kg)   BP Readings from Last 3 Encounters:  06/06/18 132/80  05/09/18 (!) 166/88  04/26/18 130/88    Assessment and Plan :  1. Essential hypertension Refills for the medications she does not have enough of provided.  BP is well controlled in office at 132/80.  We will recheck BMP at this time to ensure potassium is within normal limits.  Follow-up with cardiology in 1.5 months.  Plan to follow-up in office here in 6 months. - Basic metabolic panel - lisinopril (PRINIVIL,ZESTRIL) 40 MG tablet; Take 1 tablet (40 mg total) by mouth daily.  Dispense: 90 tablet; Refill: 0 - Metoprolol Succinate 100 MG CS24; Take 100 mg by mouth daily.  Dispense: 90 capsule; Refill: 0 - amLODipine (NORVASC) 10 MG tablet; Take 1 tablet (10 mg total) by mouth daily.  Dispense: 90 tablet; Refill: 0  2. Type 2 diabetes mellitus without complication, with long-term current use of insulin (HCC) A1c as of 04/26/2018 had decreased to 9.2 from 12.6 three months prior.  This is very  reassuring.  Patient is monitoring her blood glucose regularly.  Recommend she adjust Lantus dose by 2 units every 3 nights until she reaches a fasting glucose target of 110-120 or until she reaches a max of 30 units per day.  Follow-up with Dr. Everardo AllEllison in 1.5 months.  Contact our office if she ever goes below 70 or above 200. - Pneumococcal polysaccharide vaccine 23-valent greater than or equal to 2yo subcutaneous/IM - Empagliflozin-metFORMIN HCl ER 03-999 MG TB24; Take 1 tablet by mouth 2 (two) times daily with a meal.  Dispense: 180 tablet; Refill: 0 - insulin glargine (LANTUS) 100 UNIT/ML injection; Inject 0.4 mLs (40 Units total) into the skin daily.  Dispense: 10 mL; Refill: 2  3. Overweight (BMI 25.0-29.9) Patient has lost 11 pounds over the  past 3 months.  Congratulated on weight loss.  Encouraged to continue healthy lifestyle modifications.   Benjiman Core, PA-C  Primary Care at [facility] 06/06/2018 5:26 PM

## 2018-06-06 NOTE — Patient Instructions (Addendum)
For diabetes, continue oral meds and injection as prescribed. For lantus dose, adjust dose by 2 units every 3 days to reach fasting  glucose target of 110-120 while avoiding hypoglycemia (I.e., glucose below 70). Please keep a record of the readings and bring it to your next appointment here (or you can bring the meter itself).  You can write it on any piece of paper.  please call us sooner if your blood sugar goes below 70, or if you have a lot of readings over 200.    Contact Dr. George HughEllison's office and schedule f/u in ~1.5 months.  For hypertension, continue medications as you are. Also f/u with cardiology within 1.5 months.  Follow up here in 6 months.   Thank you for letting me participate in your health and well being.      IF you received an x-ray today, you will receive an invoice from [facility]. Please contact [facility] at [phone] with questions or concerns regarding your invoice.   IF you received labwork today, you will receive an invoice from [facility]. Please contact [facility] at [phone] with questions or concerns regarding your invoice.   Our billing staff will not be able to assist you with questions regarding bills from these companies.  You will be contacted with the lab results as soon as they are available. The fastest way to get your results is to activate your My Chart account. Instructions are located on the last page of this paperwork. If you have not heard from us regarding the results in 2 weeks, please contact this office.

## 2018-06-07 LAB — BASIC METABOLIC PANEL
BUN / CREAT RATIO: 17 (ref 9–23)
BUN: 13 mg/dL (ref 6–24)
CO2: 28 mmol/L (ref 20–29)
Calcium: 10.1 mg/dL (ref 8.7–10.2)
Chloride: 97 mmol/L (ref 96–106)
Creatinine, Ser: 0.78 mg/dL (ref 0.57–1.00)
GFR, EST AFRICAN AMERICAN: 105 mL/min/{1.73_m2} (ref >59)
GFR, EST NON AFRICAN AMERICAN: 91 mL/min/{1.73_m2} (ref >59)
Glucose: 93 mg/dL (ref 65–99)
Potassium: 3.5 mmol/L (ref 3.5–5.2)
SODIUM: 141 mmol/L (ref 134–144)

## 2018-06-09 ENCOUNTER — Encounter: Payer: Self-pay | Admitting: Radiology

## 2018-06-13 ENCOUNTER — Telehealth: Payer: Self-pay | Admitting: Physician Assistant

## 2018-06-13 NOTE — Telephone Encounter (Signed)
Copied from CRM [phone]#[id number]. Topic: General - Other >> Jun 13, 2018 12:00 PM Stephannie LiSimmons, Enyla Lisbon L, NT wrote: Reason for CRM: Patient called and said her pharmacy is waiting for a  prior authorization to fill the  Empagliflozin-metFORMIN HCl ER 03-999 MG TB24

## 2018-06-19 ENCOUNTER — Telehealth: Payer: Self-pay | Admitting: Endocrinology

## 2018-06-19 NOTE — Telephone Encounter (Signed)
[facility] is calling in regards to a PA for patients medication that they have been trying to get done since July   Please advise [phone]

## 2018-06-19 NOTE — Telephone Encounter (Signed)
Talbert ForestShirley with [facility] calling to inquire about the status of the PA.  CB#: [phone]

## 2018-06-21 ENCOUNTER — Telehealth: Payer: Self-pay | Admitting: Physician Assistant

## 2018-06-21 NOTE — Telephone Encounter (Signed)
[employer] does not manage Prior Auth's for this pt. Need to call the number on back of insurance card for further assistance.   Talbert ForestShirley with [facility] calling to inquire about the status of the PA.  CB#: [phone]  Papers in [place] [facility] - 8.21.19

## 2018-06-21 NOTE — Telephone Encounter (Signed)
I have submitted PA & I am waiting on response.

## 2018-06-27 ENCOUNTER — Telehealth: Payer: Self-pay | Admitting: Endocrinology

## 2018-06-27 NOTE — Telephone Encounter (Signed)
Semaglutide (OZEMPIC) 0.25 or 0.5 MG/DOSE SOPN   Cover my meds is calling about a PA that was being worked on by nurse and had a error while being submitted  Please advise   Phone- [phone] REF- [id number]

## 2018-06-28 ENCOUNTER — Ambulatory Visit: Payer: BLUE CROSS/BLUE SHIELD | Admitting: Cardiovascular Disease

## 2018-06-28 NOTE — Telephone Encounter (Signed)
I have called Cover My Meds they gave me 1-800 number to call & stated that there was just an electronic PA issue. I have printed off PA & faxed it to given fax number.

## 2018-06-29 NOTE — Telephone Encounter (Signed)
[employer] My Meds needs the list of all meds taken for this condition with names and strengths that have been tried and failed for this diagnosis - also, please specify if the product was brand name, generic or over the counter.   I have placed the fax in [facility] box for the CMA  Thank you!   Kenney Housemananya

## 2018-07-05 MED ORDER — GLIMEPIRIDE 1 MG PO TABS: 1.0000 mg | ORAL_TABLET | Freq: Every day | ORAL | 0 refills | Status: DC

## 2018-07-05 MED ORDER — METFORMIN HCL 1000 MG PO TABS: 1000.0000 mg | ORAL_TABLET | Freq: Two times a day (BID) | ORAL | 0 refills | Status: DC

## 2018-07-05 NOTE — Telephone Encounter (Signed)
Patient does not have insurance and therefore cannot complete a prior authorization for these medications.  She has been only taking insulin.  Her sugars have been running 170-223.  I have sent in new Rx for metformin and glimepiride as she has been on these in the past and tolerated well.  We will start low and taper up.  Plan to follow-up in 1 month.  Educated on signs of hypoglycemia.  Discontinue glimepiride if sugars drop below 70.

## 2018-07-11 ENCOUNTER — Telehealth: Payer: Self-pay

## 2018-07-11 NOTE — Telephone Encounter (Signed)
ozempic was approved from 06/28/2018-06/29/2019

## 2018-09-22 ENCOUNTER — Telehealth: Payer: Self-pay | Admitting: Physician Assistant

## 2018-09-22 NOTE — Telephone Encounter (Signed)
Called and spoke with pt regarding their appt on 12/07/18 with Barnett AbuWiseman. Due to [place] no longer being at the practice, we needed to get the appt rescheduled with a new PCP. I was able to get pt rescheduled with Dr. Leretha PolSantiago. I advised of time, building and late policy. Pt acknowledged.

## 2018-11-17 ENCOUNTER — Other Ambulatory Visit: Payer: Self-pay | Admitting: Family Medicine

## 2018-11-17 DIAGNOSIS — I10 Essential (primary) hypertension: Secondary | ICD-10-CM

## 2018-11-17 NOTE — Telephone Encounter (Signed)
30 day courtesy refill  

## 2018-12-07 ENCOUNTER — Ambulatory Visit: Payer: BLUE CROSS/BLUE SHIELD | Admitting: Physician Assistant

## 2018-12-08 ENCOUNTER — Ambulatory Visit: Payer: BLUE CROSS/BLUE SHIELD | Admitting: Family Medicine

## 2018-12-19 ENCOUNTER — Other Ambulatory Visit: Payer: Self-pay | Admitting: Family Medicine

## 2018-12-19 DIAGNOSIS — I10 Essential (primary) hypertension: Secondary | ICD-10-CM

## 2018-12-19 NOTE — Telephone Encounter (Signed)
Requested Prescriptions  Pending Prescriptions Disp Refills  . lisinopril (PRINIVIL,ZESTRIL) 40 MG tablet [Pharmacy Med Name: LISINOPRIL 40 MG TABLET] 30 tablet 0    Sig: Take 1 tablet (40 mg total) by mouth daily.     Cardiovascular:  ACE Inhibitors Failed - 12/19/2018  1:07 PM      Failed - Cr in normal range and within 180 days    Creatinine, Ser  Date Value Ref Range Status  06/06/2018 0.78 0.57 - 1.00 mg/dL Final         Failed - K in normal range and within 180 days    Potassium  Date Value Ref Range Status  06/06/2018 3.5 3.5 - 5.2 mmol/L Final         Failed - Valid encounter within last 6 months    Recent Outpatient Visits          6 months ago Essential hypertension   Primary Care at [place], [place] D, PA-C   9 months ago Essential hypertension   Primary Care at [place], [place] D, PA-C   9 months ago Annual physical exam   Primary Care at [place], [place] D, PA-C   10 months ago Type 2 diabetes mellitus without complication, with long-term current use of insulin [facility])   Primary Care at [place], [place] D, PA-C   10 months ago Type 2 diabetes mellitus without complication, with long-term current use of insulin [facility])   [facility] at [facility], Gerald Stabs, PA-C      Future Appointments            In 4 weeks Myles Lipps, MD Primary Care at [place], [facility]           Passed - Patient is not pregnant      Passed - Last BP in normal range    BP Readings from Last 1 Encounters:  06/06/18 132/80

## 2019-01-17 ENCOUNTER — Other Ambulatory Visit: Payer: Self-pay

## 2019-01-17 ENCOUNTER — Encounter: Payer: Self-pay | Admitting: Family Medicine

## 2019-01-17 ENCOUNTER — Ambulatory Visit: Payer: Self-pay | Admitting: Family Medicine

## 2019-01-17 VITALS — BP 118/80 | HR 73 | Temp 98.2°F | Ht 63.78 in | Wt 153.0 lb

## 2019-01-17 DIAGNOSIS — I10 Essential (primary) hypertension: Secondary | ICD-10-CM

## 2019-01-17 DIAGNOSIS — Z794 Long term (current) use of insulin: Secondary | ICD-10-CM

## 2019-01-17 DIAGNOSIS — E1169 Type 2 diabetes mellitus with other specified complication: Secondary | ICD-10-CM

## 2019-01-17 DIAGNOSIS — E785 Hyperlipidemia, unspecified: Secondary | ICD-10-CM

## 2019-01-17 LAB — POCT URINALYSIS DIP (MANUAL ENTRY)
Bilirubin, UA: NEGATIVE
Blood, UA: NEGATIVE
Glucose, UA: 500 mg/dL — AB
Ketones, POC UA: NEGATIVE mg/dL
Leukocytes, UA: NEGATIVE
Nitrite, UA: NEGATIVE
Protein Ur, POC: NEGATIVE mg/dL
Spec Grav, UA: 1.01 (ref 1.010–1.025)
Urobilinogen, UA: 0.2 E.U./dL
pH, UA: 6.5 (ref 5.0–8.0)

## 2019-01-17 LAB — POCT GLYCOSYLATED HEMOGLOBIN (HGB A1C): Hemoglobin A1C: 14 % — AB (ref 4.0–5.6)

## 2019-01-17 MED ORDER — LISINOPRIL 40 MG PO TABS: 40.0000 mg | ORAL_TABLET | Freq: Every day | ORAL | 3 refills | Status: DC

## 2019-01-17 MED ORDER — AMLODIPINE BESYLATE 10 MG PO TABS: 10.0000 mg | ORAL_TABLET | Freq: Every day | ORAL | 3 refills | Status: DC

## 2019-01-17 MED ORDER — CHLORTHALIDONE 25 MG PO TABS: 25.0000 mg | ORAL_TABLET | Freq: Every day | ORAL | 3 refills | Status: DC

## 2019-01-17 MED ORDER — METFORMIN HCL 1000 MG PO TABS: 1000.0000 mg | ORAL_TABLET | Freq: Two times a day (BID) | ORAL | 3 refills | Status: DC

## 2019-01-17 MED ORDER — ATORVASTATIN CALCIUM 20 MG PO TABS: 20.0000 mg | ORAL_TABLET | Freq: Every day | ORAL | 3 refills | Status: DC

## 2019-01-17 MED ORDER — INSULIN NPH (HUMAN) (ISOPHANE) 100 UNIT/ML ~~LOC~~ SUSP: 10.0000 [IU] | Freq: Two times a day (BID) | SUBCUTANEOUS | 2 refills | Status: DC

## 2019-01-17 MED ORDER — INSULIN SYRINGES (DISPOSABLE) U-100 0.5 ML MISC: 3 refills | Status: DC

## 2019-01-17 MED ORDER — SPIRONOLACTONE 25 MG PO TABS: 25.0000 mg | ORAL_TABLET | Freq: Every day | ORAL | 3 refills | Status: DC

## 2019-01-17 MED ORDER — METOPROLOL SUCCINATE ER 100 MG PO TB24: 100.0000 mg | ORAL_TABLET | Freq: Every day | ORAL | 3 refills | Status: DC

## 2019-01-17 MED ORDER — GLIMEPIRIDE 4 MG PO TABS: 4.0000 mg | ORAL_TABLET | Freq: Every day | ORAL | 3 refills | Status: DC

## 2019-01-17 NOTE — Progress Notes (Signed)
3/18/20204:04 PM  Yolanda Herrera 1970-12-02, 48 y.o. female [id number]  Chief Complaint  Patient presents with  . Diabetes    needing all medication refill, pt is prn at work, no longer has insurance to cover all meds and testing  . Hypertension  . Pain    has boil under the right arm for 1 month that is causing pain, no discharge. using triple antibiotic cream    HPI:   Patient is a 48 y.o. female with past medical history significant for DM2, HTN, HLP who presents today for routine followup  Previous PCP Barnett Abu, PA-C Last OV aug 2019 Was seeing endo, was rx ozempic and lantus Now wo insurance - cant afford Taking metformin and glimperide Having glucose variability - 200s to 70s Hypoglycemia is usually fasting - has sx: sweats, dizziness, tired, happens about once a week, she has not found trigger, as dinner is usually consistent Otherwise fastings are in the 120-130s Having urinary frequency, no dysuria No polydipsia, nausea, vomiting, abd pain, cp, SOB, numbness or tingling of extremities 200s during the day, when she eats a lot, specially rice Stopped taking kcl about a month ago as it was making her dizzy  Lab Results  Component Value Date   HGBA1C 9.2 (A) 04/26/2018   HGBA1C 12.6 01/30/2018   Lab Results  Component Value Date   LDLCALC 101 (H) 01/30/2018   CREATININE 0.78 06/06/2018    Fall Risk  06/06/2018 03/02/2018 02/27/2018 02/22/2018 02/15/2018  Falls in the past year? No No No No No     Depression screen [facility] 2/9 06/06/2018 03/02/2018 02/27/2018  Decreased Interest 0 0 0  Down, Depressed, Hopeless 0 0 0  PHQ - 2 Score 0 0 0    No Known Allergies  Prior to Admission medications   Medication Sig Start Date End Date Taking? Authorizing Provider  amLODipine (NORVASC) 10 MG tablet Take 1 tablet (10 mg total) by mouth daily. 11/17/18  Yes Myles Lipps, MD  atorvastatin (LIPITOR) 20 MG tablet Take 1 tablet (20 mg total) by mouth daily. 02/27/18  Yes  Barnett Abu, [place] D, PA-C  glimepiride (AMARYL) 1 MG tablet Take 1 tablet (1 mg total) by mouth daily with breakfast. 07/05/18  Yes Barnett Abu, [place] D, PA-C  insulin glargine (LANTUS) 100 UNIT/ML injection Inject 0.4 mLs (40 Units total) into the skin daily. 06/06/18  Yes Barnett Abu, [place] D, PA-C  lisinopril (PRINIVIL,ZESTRIL) 40 MG tablet Take 1 tablet (40 mg total) by mouth daily. 12/19/18  Yes Myles Lipps, MD  metFORMIN (GLUCOPHAGE) 1000 MG tablet Take 1 tablet (1,000 mg total) by mouth 2 (two) times daily with a meal. 07/05/18  Yes Barnett Abu, [place] D, PA-C  metoprolol succinate (TOPROL-XL) 100 MG 24 hr tablet Take 100 mg by mouth daily. 11/17/18  Yes Myles Lipps, MD  Metoprolol Succinate 100 MG CS24 Take 100 mg by mouth daily. 06/06/18  Yes Barnett Abu, [place] D, PA-C  potassium chloride SA (K-DUR,KLOR-CON) 20 MEQ tablet Take 1 tablet (20 mEq total) by mouth daily. 11/17/18  Yes Myles Lipps, MD  chlorthalidone (HYGROTON) 25 MG tablet Take 1 tablet (25 mg total) by mouth daily. 04/19/18 07/18/18  Dyann Kief, PA-C  spironolactone (ALDACTONE) 25 MG tablet Take 1 tablet (25 mg total) by mouth daily. 05/09/18 08/07/18  Iran Ouch, MD    Past Medical History:  Diagnosis Date  . Diabetes mellitus without complication (HCC)   . Hypertension     Past Surgical History:  Procedure Laterality  Date  . CESAREAN SECTION    . UTERINE FIBROID SURGERY      Social History   Tobacco Use  . Smoking status: Never Smoker  . Smokeless tobacco: Never Used  Substance Use Topics  . Alcohol use: No    Family History  Problem Relation Age of Onset  . Diabetes Mother   . Diabetes Father   . Kidney disease Father   . Hypertension Father   . Hypertension Sister   . Stroke Sister     ROS Per hpi  OBJECTIVE:  Blood pressure 118/80, pulse 73, temperature 98.2 F (36.8 C), temperature source Oral, height 5' 3.78" (1.62 m), weight 153 lb (69.4 kg), last menstrual period 01/18/2018, SpO2  100 %. Body mass index is 26.44 kg/m.   Wt Readings from Last 3 Encounters:  01/17/19 153 lb (69.4 kg)  06/06/18 158 lb (71.7 kg)  05/09/18 163 lb 12.8 oz (74.3 kg)    Physical Exam Vitals signs and nursing note reviewed.  Constitutional:      Appearance: She is well-developed.  HENT:     Head: Normocephalic and atraumatic.  Eyes:     General: No scleral icterus.    Conjunctiva/sclera: Conjunctivae normal.     Pupils: Pupils are equal, round, and reactive to light.  Neck:     Musculoskeletal: Neck supple.  Pulmonary:     Effort: Pulmonary effort is normal.  Skin:    General: Skin is warm and dry.  Neurological:     Mental Status: She is alert and oriented to person, place, and time.     Results for orders placed or performed in visit on 01/17/19 (from the past 24 hour(s))  POCT urinalysis dipstick     Status: Abnormal   Collection Time: 01/17/19  4:30 PM  Result Value Ref Range   Color, UA yellow yellow   Clarity, UA clear clear   Glucose, UA =500 (A) negative mg/dL   Bilirubin, UA negative negative   Ketones, POC UA negative negative mg/dL   Spec Grav, UA 0.092 3.300 - 1.025   Blood, UA negative negative   pH, UA 6.5 5.0 - 8.0   Protein Ur, POC negative negative mg/dL   Urobilinogen, UA 0.2 0.2 or 1.0 E.U./dL   Nitrite, UA Negative Negative   Leukocytes, UA Negative Negative  POCT glycosylated hemoglobin (Hb A1C)     Status: Abnormal   Collection Time: 01/17/19  4:33 PM  Result Value Ref Range   Hemoglobin A1C 14.0 (A) 4.0 - 5.6 %   HbA1c POC (<> result, manual entry)     HbA1c, POC (prediabetic range)     HbA1c, POC (controlled diabetic range)        ASSESSMENT and PLAN  1. Type 2 diabetes mellitus with other specified complication, with long-term current use of insulin (HCC) Uncontrolled. Limited by lack of health insurance. Will start OTC NPH, discussed titration. Also increasing glimperide, discussed titration. Reviewed med r/se/b. Will hold off on  other DM2 HCM until patient is insured.  - POCT glycosylated hemoglobin (Hb A1C) - Basic Metabolic Panel - POCT urinalysis dipstick  2. Essential hypertension Controlled. Continue current regime.  - POCT glycosylated hemoglobin (Hb A1C) - amLODipine (NORVASC) 10 MG tablet; Take 1 tablet (10 mg total) by mouth daily. - lisinopril (PRINIVIL,ZESTRIL) 40 MG tablet; Take 1 tablet (40 mg total) by mouth daily. - metoprolol succinate (TOPROL-XL) 100 MG 24 hr tablet; Take 1 tablet (100 mg total) by mouth daily. Take with or immediately  following a meal.  3. Hyperlipidemia, unspecified hyperlipidemia type Last LDL acceptable. Cont current regime. Recheck lipids once insured - atorvastatin (LIPITOR) 20 MG tablet; Take 1 tablet (20 mg total) by mouth daily.  Other orders - chlorthalidone (HYGROTON) 25 MG tablet; Take 1 tablet (25 mg total) by mouth daily. - metFORMIN (GLUCOPHAGE) 1000 MG tablet; Take 1 tablet (1,000 mg total) by mouth 2 (two) times daily with a meal. - spironolactone (ALDACTONE) 25 MG tablet; Take 1 tablet (25 mg total) by mouth daily. - glimepiride (AMARYL) 4 MG tablet; Take 1 tablet (4 mg total) by mouth daily with breakfast. - insulin NPH Human (HUMULIN N,NOVOLIN N) 100 UNIT/ML injection; Inject 0.1 mLs (10 Units total) into the skin 2 (two) times daily before a meal. - Insulin Syringes, Disposable, U-100 0.5 ML MISC; Use twice with a day with NPH. Dx E11.65, Z79.4   Return in about 6 weeks (around 02/28/2019).    [name], MD [facility] [address] [place], [place] [address] Ph.  [phone] Fax [phone]

## 2019-01-17 NOTE — Patient Instructions (Signed)
° ° ° °  If you have lab work done today you will be contacted with your lab results within the next 2 weeks.  If you have not heard from us then please contact us. The fastest way to get your results is to register for My Chart. ° ° °IF you received an x-ray today, you will receive an invoice from [facility]. Please contact [facility] at [phone] with questions or concerns regarding your invoice.  ° °IF you received labwork today, you will receive an invoice from [facility]. Please contact [facility] at [phone] with questions or concerns regarding your invoice.  ° °Our billing staff will not be able to assist you with questions regarding bills from these companies. ° °You will be contacted with the lab results as soon as they are available. The fastest way to get your results is to activate your My Chart account. Instructions are located on the last page of this paperwork. If you have not heard from us regarding the results in 2 weeks, please contact this office. °  ° ° ° °

## 2019-01-18 LAB — BASIC METABOLIC PANEL
BUN/Creatinine Ratio: 13 (ref 9–23)
BUN: 11 mg/dL (ref 6–24)
CO2: 26 mmol/L (ref 20–29)
Calcium: 10 mg/dL (ref 8.7–10.2)
Chloride: 91 mmol/L — ABNORMAL LOW (ref 96–106)
Creatinine, Ser: 0.88 mg/dL (ref 0.57–1.00)
GFR calc Af Amer: 90 mL/min/{1.73_m2} (ref >59)
GFR calc non Af Amer: 78 mL/min/{1.73_m2} (ref >59)
Glucose: 405 mg/dL — ABNORMAL HIGH (ref 65–99)
Potassium: 3.2 mmol/L — ABNORMAL LOW (ref 3.5–5.2)
Sodium: 137 mmol/L (ref 134–144)

## 2019-02-12 ENCOUNTER — Other Ambulatory Visit: Payer: Self-pay | Admitting: Family Medicine

## 2019-02-12 MED ORDER — POTASSIUM CHLORIDE CRYS ER 20 MEQ PO TBCR: 20.0000 meq | EXTENDED_RELEASE_TABLET | Freq: Every day | ORAL | 1 refills | Status: DC

## 2019-02-28 ENCOUNTER — Other Ambulatory Visit: Payer: Self-pay

## 2019-02-28 ENCOUNTER — Telehealth (INDEPENDENT_AMBULATORY_CARE_PROVIDER_SITE_OTHER): Payer: BLUE CROSS/BLUE SHIELD | Admitting: Family Medicine

## 2019-02-28 ENCOUNTER — Encounter: Payer: Self-pay | Admitting: Family Medicine

## 2019-02-28 DIAGNOSIS — Z794 Long term (current) use of insulin: Secondary | ICD-10-CM

## 2019-02-28 DIAGNOSIS — E1169 Type 2 diabetes mellitus with other specified complication: Secondary | ICD-10-CM

## 2019-02-28 DIAGNOSIS — E785 Hyperlipidemia, unspecified: Secondary | ICD-10-CM

## 2019-02-28 DIAGNOSIS — I10 Essential (primary) hypertension: Secondary | ICD-10-CM

## 2019-02-28 MED ORDER — INSULIN NPH (HUMAN) (ISOPHANE) 100 UNIT/ML ~~LOC~~ SUSP: 10.0000 [IU] | Freq: Two times a day (BID) | SUBCUTANEOUS | 4 refills | Status: DC

## 2019-02-28 MED ORDER — LISINOPRIL 40 MG PO TABS: 40.0000 mg | ORAL_TABLET | Freq: Every day | ORAL | 1 refills | Status: DC

## 2019-02-28 MED ORDER — SPIRONOLACTONE 25 MG PO TABS: 25.0000 mg | ORAL_TABLET | Freq: Every day | ORAL | 1 refills | Status: DC

## 2019-02-28 MED ORDER — POTASSIUM CHLORIDE CRYS ER 20 MEQ PO TBCR: 20.0000 meq | EXTENDED_RELEASE_TABLET | Freq: Every day | ORAL | 1 refills | Status: DC

## 2019-02-28 MED ORDER — INSULIN SYRINGES (DISPOSABLE) U-100 0.5 ML MISC: 3 refills | Status: DC

## 2019-02-28 MED ORDER — METOPROLOL SUCCINATE ER 100 MG PO TB24: 100.0000 mg | ORAL_TABLET | Freq: Every day | ORAL | 1 refills | Status: DC

## 2019-02-28 MED ORDER — METFORMIN HCL 1000 MG PO TABS: 1000.0000 mg | ORAL_TABLET | Freq: Two times a day (BID) | ORAL | 1 refills | Status: DC

## 2019-02-28 NOTE — Progress Notes (Signed)
Pt c/o follow up on diabetes and BP. Needs a refill on all medication.

## 2019-02-28 NOTE — Progress Notes (Signed)
Virtual Visit Note  I connected with patient on 02/28/19 at 448pm by phone and verified that I am speaking with the correct person using two identifiers. Yolanda Herrera is currently located at home and patient is currently with them during visit. The provider, Rutherford Guys, MD is located in their office at time of visit.  I discussed the limitations, risks, security and privacy concerns of performing an evaluation and management service by telephone and the availability of in person appointments. I also discussed with the patient that there may be a patient responsible charge related to this service. The patient expressed understanding and agreed to proceed.   CC: followup on DM2  HPI ? Patient is a 48 y.o. female with past medical history significant for DM2, HTN, HLP who presents today for routine followup after starting insulin  Last OV March 2020 No insurance, a1c 14, started OTC NPH, increased glimperide 51m Still taking metformin Taking NPH 10 units BID cbgs has been coming down  This morning 180, used to be unreadable Denies any low Polydipsia and polyuria resolved Started KCL supplementation  Lab Results  Component Value Date   CREATININE 0.88 01/17/2019   BUN 11 01/17/2019   NA 137 01/17/2019   K 3.2 (L) 01/17/2019   CL 91 (L) 01/17/2019   CO2 26 01/17/2019    Lab Results  Component Value Date   HGBA1C 14.0 (A) 01/17/2019   HGBA1C 9.2 (A) 04/26/2018   HGBA1C 12.6 01/30/2018   Lab Results  Component Value Date   LDLCALC 101 (H) 01/30/2018   CREATININE 0.88 01/17/2019    No Known Allergies  Prior to Admission medications   Medication Sig Start Date End Date Taking? Authorizing Provider  amLODipine (NORVASC) 10 MG tablet Take 1 tablet (10 mg total) by mouth daily. 01/17/19  Yes SRutherford Guys MD  atorvastatin (LIPITOR) 20 MG tablet Take 1 tablet (20 mg total) by mouth daily. 01/17/19  Yes SRutherford Guys MD  chlorthalidone (HYGROTON) 25 MG tablet  Take 1 tablet (25 mg total) by mouth daily. 01/17/19 04/17/19 Yes SRutherford Guys MD  glimepiride (AMARYL) 4 MG tablet Take 1 tablet (4 mg total) by mouth daily with breakfast. 01/17/19  Yes SRutherford Guys MD  insulin NPH Human (HUMULIN N,NOVOLIN N) 100 UNIT/ML injection Inject 0.1 mLs (10 Units total) into the skin 2 (two) times daily before a meal. 01/17/19  Yes SRutherford Guys MD  Insulin Syringes, Disposable, U-100 0.5 ML MISC Use twice with a day with NPH. Dx E11.65, Z79.4 01/17/19  Yes SRutherford Guys MD  lisinopril (PRINIVIL,ZESTRIL) 40 MG tablet Take 1 tablet (40 mg total) by mouth daily. 01/17/19  Yes SRutherford Guys MD  metFORMIN (GLUCOPHAGE) 1000 MG tablet Take 1 tablet (1,000 mg total) by mouth 2 (two) times daily with a meal. 01/17/19  Yes SRutherford Guys MD  metoprolol succinate (TOPROL-XL) 100 MG 24 hr tablet Take 1 tablet (100 mg total) by mouth daily. Take with or immediately following a meal. 01/17/19  Yes SRutherford Guys MD  potassium chloride SA (K-DUR,KLOR-CON) 20 MEQ tablet Take 1 tablet (20 mEq total) by mouth daily. 02/12/19  Yes SRutherford Guys MD  spironolactone (ALDACTONE) 25 MG tablet Take 1 tablet (25 mg total) by mouth daily. 01/17/19 04/17/19 Yes SRutherford Guys MD    Past Medical History:  Diagnosis Date  . [place]   . Hypertension     Past Surgical History:  Procedure Laterality Date  . CESAREAN SECTION    . UTERINE FIBROID SURGERY      Social History   Tobacco Use  . Smoking status: Never Smoker  . Smokeless tobacco: Never Used  Substance Use Topics  . Alcohol use: No    Family History  Problem Relation Age of Onset  . Diabetes Mother   . Diabetes Father   . Kidney disease Father   . Hypertension Father   . Hypertension Sister   . Stroke Sister     ROS Per hpi  Objective  Vitals as reported by the patient: none   ASSESSMENT and PLAN  1. Type 2 diabetes mellitus with other specified  complication, with long-term current use of insulin (HCC) Improved. Continue titration of NPH to fasting goal 90-130.  - Microalbumin / creatinine urine ratio; Future - Lipid panel; Future - TSH; Future - CMP14+EGFR; Future - Hemoglobin A1c; Future  2. Essential hypertension - Lipid panel; Future - TSH; Future - CMP14+EGFR; Future - metoprolol succinate (TOPROL-XL) 100 MG 24 hr tablet; Take 1 tablet (100 mg total) by mouth daily. Take with or immediately following a meal. - lisinopril (ZESTRIL) 40 MG tablet; Take 1 tablet (40 mg total) by mouth daily.  3. Hyperlipidemia, unspecified hyperlipidemia type - Lipid panel; Future - TSH; Future - CMP14+EGFR; Future  Other orders - spironolactone (ALDACTONE) 25 MG tablet; Take 1 tablet (25 mg total) by mouth daily. - metFORMIN (GLUCOPHAGE) 1000 MG tablet; Take 1 tablet (1,000 mg total) by mouth 2 (two) times daily with a meal. - Insulin Syringes, Disposable, U-100 0.5 ML MISC; Use twice with a day with NPH. Dx E11.65, Z79.4 - insulin NPH Human (NOVOLIN N) 100 UNIT/ML injection; Inject 0.1 mLs (10 Units total) into the skin 2 (two) times daily before a meal. - potassium chloride SA (K-DUR) 20 MEQ tablet; Take 1 tablet (20 mEq total) by mouth daily.  FOLLOW-UP: 3 months   The above assessment and management plan was discussed with the patient. The patient verbalized understanding of and has agreed to the management plan. Patient is aware to call the clinic if symptoms persist or worsen. Patient is aware when to return to the clinic for a follow-up visit. Patient educated on when it is appropriate to go to the emergency department.    I provided 10 minutes of non-face-to-face time during this encounter.  Rutherford Guys, MD Primary Care at [place], [place] [address] Ph.  [phone] Fax [phone]

## 2019-05-15 ENCOUNTER — Other Ambulatory Visit: Payer: Self-pay | Admitting: Family Medicine

## 2019-05-15 DIAGNOSIS — I10 Essential (primary) hypertension: Secondary | ICD-10-CM

## 2019-05-16 NOTE — Telephone Encounter (Signed)
Requested Prescriptions  Pending Prescriptions Disp Refills  . amLODipine (NORVASC) 10 MG tablet [Pharmacy Med Name: amlodipine 10 mg tablet] 30 tablet 3    Sig: Take 1 tablet (10 mg total) by mouth daily.     Cardiovascular:  Calcium Channel Blockers Passed - 05/15/2019  6:16 PM      Passed - Last BP in normal range    BP Readings from Last 1 Encounters:  01/17/19 118/80         Passed - Valid encounter within last 6 months    Recent Outpatient Visits          3 months ago Type 2 diabetes mellitus with other specified complication, with long-term current use of insulin [facility](HCC)   [facility] [place], Meda CoffeeIrma M, MD   11 months ago Essential hypertension   [facility], [place] D, PA-C   1 year ago Essential hypertension   [facility] [place], [place] D, PA-C   1 year ago Annual physical exam   [facility] [place], [place] D, PA-C   1 year ago Type 2 diabetes mellitus without complication, with long-term current use of insulin [facility](HCC)   [facility], Gerald StabsBrittany D, PA-C      Future Appointments            In 1 week Myles LippsSantiago, Irma M, MD [facility] [place]           . glimepiride (AMARYL) 4 MG tablet [Pharmacy Med Name: glimepiride 4 mg tablet] 30 tablet 3    Sig: Take 1 tablet (4 mg total) by mouth daily with breakfast.     Endocrinology:  Diabetes - Sulfonylureas Failed - 05/15/2019  6:16 PM      Failed - HBA1C is between 0 and 7.9 and within 180 days    Hemoglobin A1C  Date Value Ref Range Status  01/17/2019 14.0 (A) 4.0 - 5.6 % Final         Passed - Valid encounter within last 6 months    Recent Outpatient Visits          3 months ago Type 2 diabetes mellitus with other specified complication, with long-term current use of insulin [facility](HCC)   [facility] at [place], Meda CoffeeIrma M, MD   11 months ago Essential hypertension   [facility] [place], [place] D, PA-C   1 year ago Essential  hypertension   [facility], [place] D, PA-C   1 year ago Annual physical exam   [facility] [place], [place] D, PA-C   1 year ago Type 2 diabetes mellitus without complication, with long-term current use of insulin [facility](HCC)   [facility], Gerald StabsBrittany D, PA-C      Future Appointments            In 1 week Myles LippsSantiago, Irma M, MD [facility]           . chlorthalidone (HYGROTON) 25 MG tablet [Pharmacy Med Name: chlorthalidone 25 mg tablet] 30 tablet 3    Sig: Take 1 tablet (25 mg total) by mouth daily.     Cardiovascular: Diuretics - Thiazide Failed - 05/15/2019  6:16 PM      Failed - K in normal range and within 360 days    Potassium  Date Value Ref Range Status  01/17/2019 3.2 (L) 3.5 - 5.2 mmol/L Final  Passed - Ca in normal range and within 360 days    Calcium  Date Value Ref Range Status  01/17/2019 10.0 8.7 - 10.2 mg/dL Final         Passed - Cr in normal range and within 360 days    Creatinine, Ser  Date Value Ref Range Status  01/17/2019 0.88 0.57 - 1.00 mg/dL Final         Passed - Na in normal range and within 360 days    Sodium  Date Value Ref Range Status  01/17/2019 137 134 - 144 mmol/L Final         Passed - Last BP in normal range    BP Readings from Last 1 Encounters:  01/17/19 118/80         Passed - Valid encounter within last 6 months    Recent Outpatient Visits          3 months ago Type 2 diabetes mellitus with other specified complication, with long-term current use of insulin [facility])   Primary Care at [place], Lilia Argue, MD   11 months ago Essential hypertension   Primary Care at [place], [place] D, PA-C   1 year ago Essential hypertension   Primary Care at [place], [place] D, PA-C   1 year ago Annual physical exam   Primary Care at [place], [place] D, PA-C   1 year ago Type 2 diabetes mellitus without complication, with long-term current use of  insulin [facility])   Primary Care at [facility], Reather Laurence, PA-C      Future Appointments            In 1 week Rutherford Guys, MD Primary Care at [place], [facility]

## 2019-05-21 ENCOUNTER — Other Ambulatory Visit: Payer: Self-pay

## 2019-05-21 ENCOUNTER — Ambulatory Visit: Payer: Self-pay

## 2019-05-21 DIAGNOSIS — E1169 Type 2 diabetes mellitus with other specified complication: Secondary | ICD-10-CM

## 2019-05-21 DIAGNOSIS — I10 Essential (primary) hypertension: Secondary | ICD-10-CM

## 2019-05-21 DIAGNOSIS — E785 Hyperlipidemia, unspecified: Secondary | ICD-10-CM

## 2019-05-22 LAB — CMP14+EGFR
ALT: 16 IU/L (ref 0–32)
AST: 11 IU/L (ref 0–40)
Albumin/Globulin Ratio: 1.7 (ref 1.2–2.2)
Albumin: 4.5 g/dL (ref 3.8–4.8)
Alkaline Phosphatase: 83 IU/L (ref 39–117)
BUN/Creatinine Ratio: 12 (ref 9–23)
BUN: 12 mg/dL (ref 6–24)
Bilirubin Total: 0.2 mg/dL (ref 0.0–1.2)
CO2: 21 mmol/L (ref 20–29)
Calcium: 9.8 mg/dL (ref 8.7–10.2)
Chloride: 97 mmol/L (ref 96–106)
Creatinine, Ser: 0.99 mg/dL (ref 0.57–1.00)
GFR calc Af Amer: 78 mL/min/{1.73_m2} (ref >59)
GFR calc non Af Amer: 68 mL/min/{1.73_m2} (ref >59)
Globulin, Total: 2.7 g/dL (ref 1.5–4.5)
Glucose: 242 mg/dL — ABNORMAL HIGH (ref 65–99)
Potassium: 4.5 mmol/L (ref 3.5–5.2)
Sodium: 136 mmol/L (ref 134–144)
Total Protein: 7.2 g/dL (ref 6.0–8.5)

## 2019-05-22 LAB — HEMOGLOBIN A1C
Est. average glucose Bld gHb Est-mCnc: 298 mg/dL
Hgb A1c MFr Bld: 12 % — ABNORMAL HIGH (ref 4.8–5.6)

## 2019-05-22 LAB — TSH: TSH: 1.95 u[IU]/mL (ref 0.450–4.500)

## 2019-05-22 LAB — LIPID PANEL
Chol/HDL Ratio: 3.3 ratio (ref 0.0–4.4)
Cholesterol, Total: 131 mg/dL (ref 100–199)
HDL: 40 mg/dL (ref >39)
LDL Calculated: 72 mg/dL (ref 0–99)
Triglycerides: 93 mg/dL (ref 0–149)
VLDL Cholesterol Cal: 19 mg/dL (ref 5–40)

## 2019-05-25 ENCOUNTER — Other Ambulatory Visit: Payer: Self-pay

## 2019-05-25 ENCOUNTER — Ambulatory Visit (INDEPENDENT_AMBULATORY_CARE_PROVIDER_SITE_OTHER): Payer: Self-pay | Admitting: Family Medicine

## 2019-05-25 ENCOUNTER — Encounter: Payer: Self-pay | Admitting: Family Medicine

## 2019-05-25 DIAGNOSIS — E1169 Type 2 diabetes mellitus with other specified complication: Secondary | ICD-10-CM

## 2019-05-25 DIAGNOSIS — I10 Essential (primary) hypertension: Secondary | ICD-10-CM

## 2019-05-25 DIAGNOSIS — Z794 Long term (current) use of insulin: Secondary | ICD-10-CM

## 2019-05-25 MED ORDER — INSULIN NPH (HUMAN) (ISOPHANE) 100 UNIT/ML ~~LOC~~ SUSP: 15.0000 [IU] | Freq: Two times a day (BID) | SUBCUTANEOUS | 4 refills | Status: DC

## 2019-05-25 NOTE — Patient Instructions (Addendum)
1. Get new glucometer, alternate checking glucose fasting and 2 hours after dinner 2. Continue to titrate bedtime NPH by 2 units every 4 days until fasting at goal, 90-130 and having no nocturnal lows.  3. Please call with an update in about 4 weeks.      Hypoglycemia Hypoglycemia is when the sugar (glucose) level in your blood is too low. Signs of low blood sugar may include:  Feeling: ? Hungry. ? Worried or nervous (anxious). ? Sweaty and clammy. ? Confused. ? Dizzy. ? Sleepy. ? Sick to your stomach (nauseous).  Having: ? A fast heartbeat. ? A headache. ? A change in your vision. ? Tingling or no feeling (numbness) around your mouth, lips, or tongue. ? Jerky movements that you cannot control (seizure).  Having trouble with: ? Moving (coordination). ? Sleeping. ? Passing out (fainting). ? Getting upset easily (irritability). Low blood sugar can happen to people who have diabetes and people who do not have diabetes. Low blood sugar can happen quickly, and it can be an emergency. Treating low blood sugar Low blood sugar is often treated by eating or drinking something sugary right away, such as:  Fruit juice, 4-6 oz (120-150 mL).  Regular soda (not diet soda), 4-6 oz (120-150 mL).  Low-fat milk, 4 oz (120 mL).  Several pieces of hard candy.  Sugar or honey, 1 Tbsp (15 mL). Treating low blood sugar if you have diabetes If you can think clearly and swallow safely, follow the 15:15 rule:  Take 15 grams of a fast-acting carb (carbohydrate). Talk with your doctor about how much you should take.  Always keep a source of fast-acting carb with you, such as: ? Sugar tablets (glucose pills). Take 3-4 pills. ? 6-8 pieces of hard candy. ? 4-6 oz (120-150 mL) of fruit juice. ? 4-6 oz (120-150 mL) of regular (not diet) soda. ? 1 Tbsp (15 mL) honey or sugar.  Check your blood sugar 15 minutes after you take the carb.  If your blood sugar is still at or below 70 mg/dL (3.9  mmol/L), take 15 grams of a carb again.  If your blood sugar does not go above 70 mg/dL (3.9 mmol/L) after 3 tries, get help right away.  After your blood sugar goes back to normal, eat a meal or a snack within 1 hour.  Treating very low blood sugar If your blood sugar is at or below 54 mg/dL (3 mmol/L), you have very low blood sugar (severe hypoglycemia). This may also cause:  Passing out.  Jerky movements you cannot control (seizure).  Losing consciousness (coma). This is an emergency. Do not wait to see if the symptoms will go away. Get medical help right away. Call your local emergency services (911 in the [place] Do not drive yourself to the hospital. If you have very low blood sugar and you cannot eat or drink, you may need a glucagon shot (injection). A family member or friend should learn how to check your blood sugar and how to give you a glucagon shot. Ask your doctor if you need to have a glucagon shot kit at home. Follow these instructions at home: General instructions  Take over-the-counter and prescription medicines only as told by your doctor.  Stay aware of your blood sugar as told by your doctor.  Limit alcohol intake to no more than 1 drink a day for nonpregnant women and 2 drinks a day for men. One drink equals 12 oz of beer (355 mL), 5 oz of  wine (148 mL), or 1 oz of hard liquor (44 mL).  Keep all follow-up visits as told by your doctor. This is important. If you have diabetes:   Follow your diabetes care plan as told by your doctor. Make sure you: ? Know the signs of low blood sugar. ? Take your medicines as told. ? Follow your exercise and meal plan. ? Eat on time. Do not skip meals. ? Check your blood sugar as often as told by your doctor. Always check it before and after exercise. ? Follow your sick day plan when you cannot eat or drink normally. Make this plan ahead of time with your doctor.  Share your diabetes care plan with: ? Your work or  school. ? People you live with.  Check your pee (urine) for ketones: ? When you are sick. ? As told by your doctor.  Carry a card or wear jewelry that says you have diabetes. Contact a doctor if:  You have trouble keeping your blood sugar in your target range.  You have low blood sugar often. Get help right away if:  You still have symptoms after you eat or drink something sugary.  Your blood sugar is at or below 54 mg/dL (3 mmol/L).  You have jerky movements that you cannot control.  You pass out. These symptoms may be an emergency. Do not wait to see if the symptoms will go away. Get medical help right away. Call your local emergency services (911 in the [place] Do not drive yourself to the hospital. Summary  Hypoglycemia happens when the level of sugar (glucose) in your blood is too low.  Low blood sugar can happen to people who have diabetes and people who do not have diabetes. Low blood sugar can happen quickly, and it can be an emergency.  Make sure you know the signs of low blood sugar and know how to treat it.  Always keep a source of sugar (fast-acting carb) with you to treat low blood sugar. This information is not intended to replace advice given to you by your health care provider. Make sure you discuss any questions you have with your health care provider. Document Released: 01/12/2010 Document Revised: 02/08/2019 Document Reviewed: 11/21/2015 Elsevier Patient Education  [employer].     If you have lab work done today you will be contacted with your lab results within the next 2 weeks.  If you have not heard from [place] then please contact us. The fastest way to get your results is to register for My Chart.   IF you received an x-ray today, you will receive an invoice from [facility]. Please contact [facility] at [phone] with questions or concerns regarding your invoice.   IF you received labwork today, you will receive an invoice  from [facility]. Please contact [facility] at [phone] with questions or concerns regarding your invoice.   Our billing staff will not be able to assist you with questions regarding bills from these companies.  You will be contacted with the lab results as soon as they are available. The fastest way to get your results is to activate your My Chart account. Instructions are located on the last page of this paperwork. If you have not heard from [place] regarding the results in 2 weeks, please contact this office.

## 2019-05-25 NOTE — Progress Notes (Signed)
7/24/20204:12 PM  Yolanda Herrera 1971/02/22, 48 y.o., female [id number]  Chief Complaint  Patient presents with  . medical condition    3 m f/u   . left side pain    1 week ago     HPI:   Patient is a 48 y.o. female with past medical history significant for DM2, HTN, HLP who presents today for routine followup  Last OV April 2020 - telemedicine Due to lack of insurance, started NPH and glimperide Checking fasting 120-140s NPH 12 units BID glimperide 4mg  tolerating well Sometimes feels she has low sugars if she does not eat a good breakfast will start sweating around 2pm Reports recent labs are fasting - glucose 242 Her glucometer is about 49 years old Skips lunch because remains full from breakfast until dinner, no significant nausea or vomiting  Lab Results  Component Value Date   HGBA1C 12.0 (H) 05/21/2019   HGBA1C 14.0 (A) 01/17/2019   HGBA1C 9.2 (A) 04/26/2018   Lab Results  Component Value Date   LDLCALC 72 05/21/2019   CREATININE 0.99 05/21/2019    Depression screen [facility] 2/9 05/25/2019 02/28/2019 06/06/2018  Decreased Interest 0 0 0  Down, Depressed, Hopeless 0 0 0  PHQ - 2 Score 0 0 0    Fall Risk  05/25/2019 02/28/2019 06/06/2018 03/02/2018 02/27/2018  Falls in the past year? 0 0 No No No  Number falls in past yr: 0 - - - -  Injury with Fall? 0 - - - -  Follow up Falls evaluation completed Falls evaluation completed - - -     No Known Allergies  Prior to Admission medications   Medication Sig Start Date End Date Taking? Authorizing Provider  amLODipine (NORVASC) 10 MG tablet Take 1 tablet (10 mg total) by mouth daily. 05/16/19  Yes Rutherford Guys, MD  atorvastatin (LIPITOR) 20 MG tablet Take 1 tablet (20 mg total) by mouth daily. 01/17/19  Yes Rutherford Guys, MD  chlorthalidone (HYGROTON) 25 MG tablet Take 1 tablet (25 mg total) by mouth daily. 05/16/19 08/14/19 Yes Rutherford Guys, MD  glimepiride (AMARYL) 4 MG tablet Take 1 tablet (4 mg total) by mouth  daily with breakfast. 05/16/19  Yes Rutherford Guys, MD  insulin NPH Human (NOVOLIN N) 100 UNIT/ML injection Inject 0.1 mLs (10 Units total) into the skin 2 (two) times daily before a meal. 02/28/19  Yes Rutherford Guys, MD  Insulin Syringes, Disposable, U-100 0.5 ML MISC Use twice with a day with NPH. Dx E11.65, Z79.4 02/28/19  Yes Rutherford Guys, MD  lisinopril (ZESTRIL) 40 MG tablet Take 1 tablet (40 mg total) by mouth daily. 02/28/19  Yes Rutherford Guys, MD  metFORMIN (GLUCOPHAGE) 1000 MG tablet Take 1 tablet (1,000 mg total) by mouth 2 (two) times daily with a meal. 02/28/19  Yes Rutherford Guys, MD  metoprolol succinate (TOPROL-XL) 100 MG 24 hr tablet Take 1 tablet (100 mg total) by mouth daily. Take with or immediately following a meal. 02/28/19  Yes Rutherford Guys, MD  potassium chloride SA (K-DUR) 20 MEQ tablet Take 1 tablet (20 mEq total) by mouth daily. 02/28/19  Yes Rutherford Guys, MD  spironolactone (ALDACTONE) 25 MG tablet Take 1 tablet (25 mg total) by mouth daily. 02/28/19 05/29/19 Yes Rutherford Guys, MD    Past Medical History:  Diagnosis Date  . Diabetes mellitus without complication ([place])   . Hypertension     Past Surgical History:  Procedure Laterality  Date  . CESAREAN SECTION    . UTERINE FIBROID SURGERY      Social History   Tobacco Use  . Smoking status: Never Smoker  . Smokeless tobacco: Never Used  Substance Use Topics  . Alcohol use: No    Family History  Problem Relation Age of Onset  . Diabetes Mother   . Diabetes Father   . Kidney disease Father   . Hypertension Father   . Hypertension Sister   . Stroke Sister     Review of Systems  Constitutional: Negative for chills and fever.  Respiratory: Negative for cough and shortness of breath.   Cardiovascular: Negative for chest pain, palpitations and leg swelling.  Gastrointestinal: Negative for abdominal pain, nausea and vomiting.     OBJECTIVE:  Today's Vitals   05/25/19 1556  BP:  121/77  Pulse: 71  Temp: 98.4 F (36.9 C)  TempSrc: Oral  SpO2: 98%  Weight: 165 lb 3.2 oz (74.9 kg)  Height: 5\' 3"  (1.6 m)   Body mass index is 29.26 kg/m.  Wt Readings from Last 3 Encounters:  05/25/19 165 lb 3.2 oz (74.9 kg)  01/17/19 153 lb (69.4 kg)  06/06/18 158 lb (71.7 kg)   Physical Exam Vitals signs and nursing note reviewed.  Constitutional:      Appearance: She is well-developed.  HENT:     Head: Normocephalic and atraumatic.     Mouth/Throat:     Pharynx: No oropharyngeal exudate.  Eyes:     General: No scleral icterus.    Conjunctiva/sclera: Conjunctivae normal.     Pupils: Pupils are equal, round, and reactive to light.  Neck:     Musculoskeletal: Neck supple.  Cardiovascular:     Rate and Rhythm: Normal rate and regular rhythm.     Heart sounds: Normal heart sounds. No murmur. No friction rub. No gallop.   Pulmonary:     Effort: Pulmonary effort is normal.     Breath sounds: Normal breath sounds. No wheezing or rales.  Skin:    General: Skin is warm and dry.  Neurological:     Mental Status: She is alert and oriented to person, place, and time.       ASSESSMENT and PLAN  1. Type 2 diabetes mellitus with other specified complication, with long-term current use of insulin (HCC) Improved but above goal. Discussed new glucometer and titrating bedtime nph. Discussed checking 2PP as well. - Microalbumin / creatinine urine ratio  2. Essential hypertension Controlled. Continue current regime.   Other orders - insulin NPH Human (NOVOLIN N) 100 UNIT/ML injection; Inject 0.15 mLs (15 Units total) into the skin 2 (two) times daily before a meal.  Return in about 3 months (around 08/25/2019).    [name], MD Primary Care at [facility] [address] [place], [place] [address] Ph.  [phone] Fax [phone]

## 2019-05-26 LAB — MICROALBUMIN / CREATININE URINE RATIO
Creatinine, Urine: 161.2 mg/dL
Microalb/Creat Ratio: 7 mg/g creat (ref 0–29)
Microalbumin, Urine: 10.5 ug/mL

## 2019-07-30 ENCOUNTER — Other Ambulatory Visit: Payer: Self-pay | Admitting: Family Medicine

## 2019-07-30 DIAGNOSIS — I10 Essential (primary) hypertension: Secondary | ICD-10-CM

## 2019-08-27 ENCOUNTER — Ambulatory Visit (INDEPENDENT_AMBULATORY_CARE_PROVIDER_SITE_OTHER): Payer: Self-pay | Admitting: Family Medicine

## 2019-08-28 ENCOUNTER — Encounter: Payer: Self-pay | Admitting: Family Medicine

## 2019-10-19 ENCOUNTER — Other Ambulatory Visit: Payer: Self-pay | Admitting: Family Medicine

## 2019-10-19 DIAGNOSIS — I10 Essential (primary) hypertension: Secondary | ICD-10-CM

## 2019-10-19 NOTE — Telephone Encounter (Signed)
Forwarding medication refill requests to the clinical pool for review. 

## 2019-10-21 ENCOUNTER — Telehealth: Payer: Self-pay | Admitting: *Deleted

## 2019-10-21 NOTE — Telephone Encounter (Signed)
30 days prescription sent in Please schedule an appointment no more refills can be sent patient no showed her 3 month follow up

## 2019-10-22 NOTE — Telephone Encounter (Signed)
Spoke with pt and scheduled appt °

## 2019-11-14 ENCOUNTER — Other Ambulatory Visit: Payer: Self-pay | Admitting: Family Medicine

## 2019-11-14 NOTE — Telephone Encounter (Signed)
Patient has upcoming appointment this month- RF with no additional.

## 2019-11-23 ENCOUNTER — Other Ambulatory Visit: Payer: Self-pay

## 2019-11-23 ENCOUNTER — Ambulatory Visit (INDEPENDENT_AMBULATORY_CARE_PROVIDER_SITE_OTHER): Payer: 59 | Admitting: Family Medicine

## 2019-11-23 ENCOUNTER — Encounter: Payer: Self-pay | Admitting: Family Medicine

## 2019-11-23 VITALS — BP 132/84 | HR 77 | Temp 97.2°F | Ht 63.0 in | Wt 163.0 lb

## 2019-11-23 DIAGNOSIS — Z794 Long term (current) use of insulin: Secondary | ICD-10-CM

## 2019-11-23 DIAGNOSIS — I10 Essential (primary) hypertension: Secondary | ICD-10-CM | POA: Diagnosis not present

## 2019-11-23 DIAGNOSIS — E1169 Type 2 diabetes mellitus with other specified complication: Secondary | ICD-10-CM

## 2019-11-23 DIAGNOSIS — E785 Hyperlipidemia, unspecified: Secondary | ICD-10-CM | POA: Diagnosis not present

## 2019-11-23 MED ORDER — AMLODIPINE BESYLATE 10 MG PO TABS: 10.0000 mg | ORAL_TABLET | Freq: Every day | ORAL | 1 refills | Status: DC

## 2019-11-23 MED ORDER — CHLORTHALIDONE 25 MG PO TABS: 25.0000 mg | ORAL_TABLET | Freq: Every day | ORAL | 0 refills | Status: DC

## 2019-11-23 MED ORDER — BLOOD GLUCOSE METER KIT: PACK | 11 refills | Status: AC

## 2019-11-23 MED ORDER — GLIMEPIRIDE 4 MG PO TABS: 4.0000 mg | ORAL_TABLET | Freq: Every day | ORAL | 1 refills | Status: DC

## 2019-11-23 MED ORDER — ATORVASTATIN CALCIUM 20 MG PO TABS: 20.0000 mg | ORAL_TABLET | Freq: Every day | ORAL | 3 refills | Status: DC

## 2019-11-23 MED ORDER — LISINOPRIL 40 MG PO TABS: 40.0000 mg | ORAL_TABLET | Freq: Every day | ORAL | 1 refills | Status: DC

## 2019-11-23 MED ORDER — SPIRONOLACTONE 25 MG PO TABS: 25.0000 mg | ORAL_TABLET | Freq: Every day | ORAL | 1 refills | Status: DC

## 2019-11-23 MED ORDER — METOPROLOL SUCCINATE ER 100 MG PO TB24: 100.0000 mg | ORAL_TABLET | Freq: Every day | ORAL | 1 refills | Status: DC

## 2019-11-23 MED ORDER — METFORMIN HCL ER (OSM) 1000 MG PO TB24: 1000.0000 mg | ORAL_TABLET | Freq: Two times a day (BID) | ORAL | 1 refills | Status: DC

## 2019-11-23 MED ORDER — OMEPRAZOLE 20 MG PO CPDR: 20.0000 mg | DELAYED_RELEASE_CAPSULE | Freq: Two times a day (BID) | ORAL | 1 refills | Status: DC

## 2019-11-23 NOTE — Progress Notes (Signed)
1/22/20215:23 PM  Yolanda Herrera June 21, 1971, 49 y.o., female [id number]  Chief Complaint  Patient presents with  . Follow-up    medical conditions, requesting the brand name of the metformin due to the taste of the generic    HPI:   Patient is a 49 y.o. female with past medical history significant for DM2, HTN, HLPwho presents today forroutine followup  Last OV July 2020 - titration of nph cbgs 160-170s fasting NPH at bedtime 15 units, tried to increase to 16 units, but did not feel well so brought back to 15 units Wants to change back to extended release metformin which does not taste bad like IR Continues to take glimperide 63m once a day She used to do well on ozempic Having increased bloating and decrease appettite no abd pain, nausea, vomiting, melena or hematochezia She is having sign reflux specially at worse  Lab Results  Component Value Date   HGBA1C 12.0 (H) 05/21/2019   HGBA1C 14.0 (A) 01/17/2019   HGBA1C 9.2 (A) 04/26/2018   Lab Results  Component Value Date   LDLCALC 72 05/21/2019   CREATININE 0.99 05/21/2019    [facility] 11/[facility] 05/25/2019 02/28/2019  Decreased Interest 0 0 0  Down, Depressed, Hopeless 0 0 0  PHQ - 2 Score 0 0 0    Fall Risk  11/23/2019 05/25/2019 02/28/2019 06/06/2018 03/02/2018  Falls in the past year? 0 0 0 No No  Number falls in past yr: 0 0 - - -  Injury with Fall? 0 0 - - -  Follow up - Falls evaluation completed Falls evaluation completed - -     No Known Allergies  Prior to Admission medications   Medication Sig Start Date End Date Taking? Authorizing Provider  amLODipine (NORVASC) 10 MG tablet Take 1 tablet (10 mg total) by mouth daily. 10/21/19  Yes SRutherford Guys MD  atorvastatin (LIPITOR) 20 MG tablet Take 1 tablet (20 mg total) by mouth daily. 01/17/19  Yes SRutherford Guys MD  chlorthalidone (HYGROTON) 25 MG tablet Take 1 tablet (25 mg total) by mouth daily. 11/14/19 02/12/20 Yes SRutherford Guys  MD  glimepiride (AMARYL) 4 MG tablet Take 1 tablet (4 mg total) by mouth daily with breakfast. 10/21/19  Yes SRutherford Guys MD  insulin NPH Human (NOVOLIN N) 100 UNIT/ML injection Inject 0.15 mLs (15 Units total) into the skin 2 (two) times daily before a meal. 05/25/19  Yes SRutherford Guys MD  Insulin Syringes, Disposable, U-100 0.5 ML MISC Use twice with a day with NPH. Dx E11.65, Z79.4 02/28/19  Yes SRutherford Guys MD  lisinopril (ZESTRIL) 40 MG tablet Take 1 tablet (40 mg total) by mouth daily. 07/30/19  Yes SRutherford Guys MD  metFORMIN (GLUCOPHAGE) 1000 MG tablet Take 1 tablet (1,000 mg total) by mouth 2 (two) times daily with a meal. 02/28/19  Yes SRutherford Guys MD  metoprolol succinate (TOPROL-XL) 100 MG 24 hr tablet Take 1 tablet (100 mg total) by mouth daily. Take with or immediately following a meal. 07/30/19  Yes SRutherford Guys MD  potassium chloride SA (KLOR-CON) 20 MEQ tablet Take 1 tablet (20 mEq total) by mouth daily. 11/14/19  Yes SRutherford Guys MD  spironolactone (ALDACTONE) 25 MG tablet Take 1 tablet (25 mg total) by mouth daily. 02/28/19 05/29/19  SRutherford Guys MD    Past Medical History:  Diagnosis Date  . Diabetes mellitus [place] complication ([place]   . Hypertension  Past Surgical History:  Procedure Laterality Date  . CESAREAN SECTION    . UTERINE FIBROID SURGERY      Social History   Tobacco Use  . Smoking status: Never Smoker  . Smokeless tobacco: Never Used  Substance Use Topics  . Alcohol use: No    Family History  Problem Relation Age of Onset  . Diabetes Mother   . Diabetes Father   . Kidney disease Father   . Hypertension Father   . Hypertension Sister   . Stroke Sister     Review of Systems  Constitutional: Negative for chills and fever.  Respiratory: Negative for cough and shortness of breath.   Cardiovascular: Negative for chest pain, palpitations and leg swelling.  Gastrointestinal: Negative for abdominal pain, nausea  and vomiting.   Per hpi  OBJECTIVE:  Today's Vitals   11/23/19 1705  BP: 132/84  Pulse: 77  Temp: (!) 97.2 F (36.2 C)  SpO2: 99%  Weight: 163 lb (73.9 kg)  Height: 5' 3"  (1.6 m)   Body mass index is 28.87 kg/m.  Wt Readings from Last 3 Encounters:  11/23/19 163 lb (73.9 kg)  05/25/19 165 lb 3.2 oz (74.9 kg)  01/17/19 153 lb (69.4 kg)    Physical Exam Vitals and nursing note reviewed.  Constitutional:      Appearance: She is well-developed.  HENT:     Head: Normocephalic and atraumatic.     Mouth/Throat:     Pharynx: No oropharyngeal exudate.  Eyes:     General: No scleral icterus.    Conjunctiva/sclera: Conjunctivae normal.     Pupils: Pupils are equal, round, and reactive to light.  Cardiovascular:     Rate and Rhythm: Normal rate and regular rhythm.     Heart sounds: Normal heart sounds. No murmur. No friction rub. No gallop.   Pulmonary:     Effort: Pulmonary effort is normal.     Breath sounds: Normal breath sounds. No wheezing or rales.  Musculoskeletal:     Cervical back: Neck supple.     Right lower leg: No edema.     Left lower leg: No edema.  Skin:    General: Skin is warm and dry.  Neurological:     Mental Status: She is alert and oriented to person, place, and time.     No results found for this or any previous visit (from the past 24 hour(s)).  No results found.   ASSESSMENT and PLAN  1. Type 2 diabetes mellitus with other specified complication, with long-term current use of insulin (HCC) Most recently uncontrolled. Checking labs today, medications will be adjusted as needed. Consider adding GLP1 which she has done well on in the past. Reviewed med r/se/b.  - Lipid panel - CMP14+EGFR - Ambulatory referral to Ophthalmology - Hemoglobin A1c  2. Essential hypertension Controlled. Continue current regime.  - Lipid panel - CMP14+EGFR - amLODipine (NORVASC) 10 MG tablet; Take 1 tablet (10 mg total) by mouth daily. - lisinopril (ZESTRIL)  40 MG tablet; Take 1 tablet (40 mg total) by mouth daily. - metoprolol succinate (TOPROL-XL) 100 MG 24 hr tablet; Take 1 tablet (100 mg total) by mouth daily. Take with or immediately following a meal.  3. Hyperlipidemia, unspecified hyperlipidemia type Checking labs today, medications will be adjusted as needed.  - Lipid panel - CMP14+EGFR - atorvastatin (LIPITOR) 20 MG tablet; Take 1 tablet (20 mg total) by mouth daily.  Started omeprazole for gerd, reviewed LFM  Other orders - blood glucose  meter kit and supplies; per insurance preference. Use up to three times daily as directed. E1.9, Z79.4 Please dispense lancets and glucose test stripes to match glucometer, 100 each - chlorthalidone (HYGROTON) 25 MG tablet; Take 1 tablet (25 mg total) by mouth daily. - glimepiride (AMARYL) 4 MG tablet; Take 1 tablet (4 mg total) by mouth daily with breakfast. - spironolactone (ALDACTONE) 25 MG tablet; Take 1 tablet (25 mg total) by mouth daily. - metformin (FORTAMET) 1000 MG (OSM) 24 hr tablet; Take 1 tablet (1,000 mg total) by mouth 2 (two) times daily with a meal. - omeprazole (PRILOSEC) 20 MG capsule; Take 1 capsule (20 mg total) by mouth 2 (two) times daily before a meal.  Return in about 3 months (around 02/21/2020).    Rutherford Guys, MD Primary Care at [place], [place] [address] Ph.  [phone] Fax [phone]

## 2019-11-23 NOTE — Patient Instructions (Signed)
° ° ° °  If you have lab work done today you will be contacted with your lab results within the next 2 weeks.  If you have not heard from us then please contact us. The fastest way to get your results is to register for My Chart. ° ° °IF you received an x-ray today, you will receive an invoice from [facility]. Please contact [facility] at [phone] with questions or concerns regarding your invoice.  ° °IF you received labwork today, you will receive an invoice from [facility]. Please contact [facility] at [phone] with questions or concerns regarding your invoice.  ° °Our billing staff will not be able to assist you with questions regarding bills from these companies. ° °You will be contacted with the lab results as soon as they are available. The fastest way to get your results is to activate your My Chart account. Instructions are located on the last page of this paperwork. If you have not heard from us regarding the results in 2 weeks, please contact this office. °  ° ° ° °

## 2019-11-24 LAB — CMP14+EGFR
ALT: 19 IU/L (ref 0–32)
AST: 14 IU/L (ref 0–40)
Albumin/Globulin Ratio: 1.8 (ref 1.2–2.2)
Albumin: 4.4 g/dL (ref 3.8–4.8)
Alkaline Phosphatase: 75 IU/L (ref 39–117)
BUN/Creatinine Ratio: 10 (ref 9–23)
BUN: 9 mg/dL (ref 6–24)
Bilirubin Total: 0.3 mg/dL (ref 0.0–1.2)
CO2: 27 mmol/L (ref 20–29)
Calcium: 9.6 mg/dL (ref 8.7–10.2)
Chloride: 100 mmol/L (ref 96–106)
Creatinine, Ser: 0.9 mg/dL (ref 0.57–1.00)
GFR calc Af Amer: 87 mL/min/{1.73_m2} (ref >59)
GFR calc non Af Amer: 75 mL/min/{1.73_m2} (ref >59)
Globulin, Total: 2.5 g/dL (ref 1.5–4.5)
Glucose: 200 mg/dL — ABNORMAL HIGH (ref 65–99)
Potassium: 3.4 mmol/L — ABNORMAL LOW (ref 3.5–5.2)
Sodium: 142 mmol/L (ref 134–144)
Total Protein: 6.9 g/dL (ref 6.0–8.5)

## 2019-11-24 LAB — LIPID PANEL
Chol/HDL Ratio: 3.4 ratio (ref 0.0–4.4)
Cholesterol, Total: 115 mg/dL (ref 100–199)
HDL: 34 mg/dL — ABNORMAL LOW (ref >39)
LDL Chol Calc (NIH): 63 mg/dL (ref 0–99)
Triglycerides: 91 mg/dL (ref 0–149)
VLDL Cholesterol Cal: 18 mg/dL (ref 5–40)

## 2019-11-24 LAB — HEMOGLOBIN A1C
Est. average glucose Bld gHb Est-mCnc: 381 mg/dL
Hgb A1c MFr Bld: 14.9 % — ABNORMAL HIGH (ref 4.8–5.6)

## 2019-12-06 MED ORDER — OZEMPIC (0.25 OR 0.5 MG/DOSE) 2 MG/1.5ML ~~LOC~~ SOPN: 0.5000 mg | PEN_INJECTOR | SUBCUTANEOUS | 3 refills | Status: DC

## 2019-12-06 NOTE — Addendum Note (Signed)
Addended by: Myles Lipps on: 12/06/2019 01:28 PM   Modules accepted: Orders

## 2019-12-07 LAB — HM DIABETES EYE EXAM

## 2020-01-09 ENCOUNTER — Other Ambulatory Visit: Payer: Self-pay

## 2020-01-11 ENCOUNTER — Other Ambulatory Visit: Payer: Self-pay

## 2020-01-11 ENCOUNTER — Encounter: Payer: Self-pay | Admitting: Internal Medicine

## 2020-01-11 ENCOUNTER — Ambulatory Visit (INDEPENDENT_AMBULATORY_CARE_PROVIDER_SITE_OTHER): Payer: 59 | Admitting: Internal Medicine

## 2020-01-11 VITALS — BP 140/86 | HR 84 | Temp 98.3°F | Ht 63.0 in | Wt 165.0 lb

## 2020-01-11 DIAGNOSIS — E11319 Type 2 diabetes mellitus with unspecified diabetic retinopathy without macular edema: Secondary | ICD-10-CM | POA: Insufficient documentation

## 2020-01-11 DIAGNOSIS — E1165 Type 2 diabetes mellitus with hyperglycemia: Secondary | ICD-10-CM | POA: Diagnosis not present

## 2020-01-11 DIAGNOSIS — Z794 Long term (current) use of insulin: Secondary | ICD-10-CM | POA: Diagnosis not present

## 2020-01-11 DIAGNOSIS — E785 Hyperlipidemia, unspecified: Secondary | ICD-10-CM | POA: Insufficient documentation

## 2020-01-11 MED ORDER — LANTUS SOLOSTAR 100 UNIT/ML ~~LOC~~ SOPN: 16.0000 [IU] | PEN_INJECTOR | Freq: Every day | SUBCUTANEOUS | 11 refills | Status: DC

## 2020-01-11 MED ORDER — INSULIN PEN NEEDLE 32G X 4 MM MISC: 1.0000 | Freq: Every day | 11 refills | Status: DC

## 2020-01-11 MED ORDER — METFORMIN HCL ER (OSM) 1000 MG PO TB24: 1000.0000 mg | ORAL_TABLET | Freq: Two times a day (BID) | ORAL | 3 refills | Status: DC

## 2020-01-11 MED ORDER — OZEMPIC (0.25 OR 0.5 MG/DOSE) 2 MG/1.5ML ~~LOC~~ SOPN: 0.5000 mg | PEN_INJECTOR | SUBCUTANEOUS | 3 refills | Status: DC

## 2020-01-11 NOTE — Patient Instructions (Addendum)
-   STOP Glimepiride  - Start Lantus 16 units daily  - Increase Metformin 1000 mg Twice daily with meals - Continue Ozempic 0.5 mg weekly  - STOP Novolin-N       - HOW TO TREAT LOW BLOOD SUGARS (Blood sugar LESS THAN 70 MG/DL)  Please follow the RULE OF 15 for the treatment of hypoglycemia treatment (when your (blood sugars are less than 70 mg/dL)    STEP 1: Take 15 grams of carbohydrates when your blood sugar is low, which includes:   3-4 GLUCOSE TABS  OR  3-4 OZ OF JUICE OR REGULAR SODA OR  ONE TUBE OF GLUCOSE GEL     STEP 2: RECHECK blood sugar in 15 MINUTES STEP 3: If your blood sugar is still low at the 15 minute recheck --> then, go back to STEP 1 and treat AGAIN with another 15 grams of carbohydrates.

## 2020-01-11 NOTE — Progress Notes (Signed)
Name: Bernice Mcauliffe  MRN/ DOB: [id number], 20-Dec-1970   Age/ Sex: 49 y.o., female    PCP: Rutherford Guys, MD   Reason for Endocrinology Evaluation: Type 2 Diabetes Mellitus     Date of Initial Endocrinology Visit: 01/11/2020     PATIENT IDENTIFIER: [name] is a 49 y.o. female with a past medical history of DM and dyslipidemia. The patient presented for initial endocrinology clinic visit on 01/11/2020 for consultative assistance with her diabetes management.    HPI: Ms. Pickerel was    Diagnosed with DM in 2011  Prior Medications tried/Intolerance: Jardiance- insurance did not cover , Ozempic started 12/2019 Currently checking blood sugars 1 x /every other day  Hypoglycemia episodes : 0          Hemoglobin A1c has ranged from 9.2% in 2019, peaking at 14.9% in 2021. Patient required assistance for hypoglycemia: no Patient has required hospitalization within the last 1 year from hyper or hypoglycemia: no  In terms of diet, the patient eats 2x  a day, does not snack. Drinks juice.    Works at [employer] at [employer]   7 Am to 3 pm   [place]: Metformin 1000 mg BID - has been taking 1x a day  Glimepiride 4 mg daily   Ozempic 0.5 mg weekly  NPH - N 15 units bedtime    Did not take medications  From the mid to end of January   Statin: yes ACE-I/ARB: yes Prior Diabetic Education: Yes   METER DOWNLOAD SUMMARY: Did not bring  200's prior to ozempic  Mid 100's on Ozempic   DIABETIC COMPLICATIONS: Microvascular complications:  Retinopathy ? Right,  Denies: neuropathy  Last eye exam: Completed 12/2019  Macrovascular complications:   Denies: CAD, PVD, CVA   PAST HISTORY: Past Medical History:  Past Medical History:  Diagnosis Date  . Diabetes mellitus without complication ([place])   . Hypertension    Past Surgical History:  Past Surgical History:  Procedure Laterality Date  . CESAREAN SECTION    . UTERINE FIBROID SURGERY       Social History:  reports that she has never smoked. She has never used smokeless tobacco. She reports that she does not drink alcohol or use drugs. Family History:  Family History  Problem Relation Age of Onset  . Diabetes Mother   . Diabetes Father   . Kidney disease Father   . Hypertension Father   . Hypertension Sister   . Stroke Sister      HOME MEDICATIONS: Allergies as of 01/11/2020   No Known Allergies     Medication List       Accurate as of January 11, 2020  2:46 PM. If you have any questions, ask your nurse or doctor.        amLODipine 10 MG tablet Commonly known as: NORVASC Take 1 tablet (10 mg total) by mouth daily.   atorvastatin 20 MG tablet Commonly known as: LIPITOR Take 1 tablet (20 mg total) by mouth daily.   blood glucose meter kit and supplies per insurance preference. Use up to three times daily as directed. E1.9, Z79.4 Please dispense lancets and glucose test stripes to match glucometer, 100 each   chlorthalidone 25 MG tablet Commonly known as: HYGROTON Take 1 tablet (25 mg total) by mouth daily.   glimepiride 4 MG tablet Commonly known as: AMARYL Take 1 tablet (4 mg total) by mouth daily with breakfast.   insulin NPH Human 100 UNIT/ML  injection Commonly known as: NOVOLIN N Inject 0.15 mLs (15 Units total) into the skin 2 (two) times daily before a meal.   Insulin Syringes (Disposable) U-100 0.5 ML Misc Use twice with a day with NPH. Dx E11.65, Z79.4   lisinopril 40 MG tablet Commonly known as: ZESTRIL Take 1 tablet (40 mg total) by mouth daily.   metformin 1000 MG (OSM) 24 hr tablet Commonly known as: FORTAMET Take 1 tablet (1,000 mg total) by mouth 2 (two) times daily with a meal. What changed: additional instructions   metoprolol succinate 100 MG 24 hr tablet Commonly known as: TOPROL-XL Take 1 tablet (100 mg total) by mouth daily. Take with or immediately following a meal.   omeprazole 20 MG capsule Commonly known as:  PRILOSEC Take 1 capsule (20 mg total) by mouth 2 (two) times daily before a meal.   Ozempic (0.25 or 0.5 MG/DOSE) 2 MG/1.5ML Sopn Generic drug: Semaglutide(0.25 or 0.5MG/DOS) Inject 0.5 mg into the skin once a week.   potassium chloride SA 20 MEQ tablet Commonly known as: KLOR-CON Take 1 tablet (20 mEq total) by mouth daily.   spironolactone 25 MG tablet Commonly known as: ALDACTONE Take 1 tablet (25 mg total) by mouth daily.        ALLERGIES: No Known Allergies   REVIEW OF SYSTEMS: A comprehensive ROS was conducted with the patient and is negative except as per HPI and below:  Review of Systems  Gastrointestinal: Negative for diarrhea and nausea.  Neurological: Negative for tingling and tremors.      OBJECTIVE:   VITAL SIGNS: BP 140/86   Pulse 84   Temp 98.3 F (36.8 C) (Oral)   Ht 5' 3"  (1.6 m)   Wt 165 lb (74.8 kg)   LMP 01/18/2018 (Approximate)   SpO2 99%   BMI 29.23 kg/m    PHYSICAL EXAM:  General: Pt appears well and is in NAD  HEENT:  Eyes: External eye exam normal without stare, lid lag or exophthalmos.  EOM intact.    Neck: General: Supple without adenopathy or carotid bruits. Thyroid: Thyroid size normal.  No goiter or nodules appreciated. No thyroid bruit.  Lungs: Clear with good BS bilat with no rales, rhonchi, or wheezes  Heart: RRR with normal S1 and S2 and no gallops; no murmurs; no rub  Abdomen: Normoactive bowel sounds, soft, nontender, without masses or organomegaly palpable  Extremities:  Lower extremities - No pretibial edema. No lesions.  Skin: Normal texture and temperature to palpation.   Neuro: MS is good with appropriate affect, pt is alert and Ox3    DM foot exam: 01/11/2020  The skin of the feet is intact without sores or ulcerations. The pedal pulses are 2+ on right and 2+ on left. The sensation is intact to a screening 5.07, 10 gram monofilament bilaterally   DATA REVIEWED:  Lab Results  Component Value Date   HGBA1C  14.9 (H) 11/23/2019   HGBA1C 12.0 (H) 05/21/2019   HGBA1C 14.0 (A) 01/17/2019   Lab Results  Component Value Date   LDLCALC 63 11/23/2019   CREATININE 0.90 11/23/2019   Lab Results  Component Value Date   MICRALBCREAT 7 05/25/2019    Lab Results  Component Value Date   CHOL 115 11/23/2019   HDL 34 (L) 11/23/2019   LDLCALC 63 11/23/2019   TRIG 91 11/23/2019   CHOLHDL 3.4 11/23/2019        ASSESSMENT / PLAN / RECOMMENDATIONS:   1) Type 2 Diabetes Mellitus, Poorly  controlled, With retinopathic complications - Most recent A1c of 14.9 %. Goal A1c < 7.0 %.    Plan: GENERAL: Poorly controlled diabetes due to sub-optimal medical management. We discussed the importance of lifestyle changes in diabetes care. We also discussed the importance of glucose checks at home and having that data available to me.  Pt declined RD referral today  Will adjust her regimen as below      MEDICATIONS: - STOP Glimepiride  - Start Lantus 16 units daily  - Increase Metformin 1000 mg Twice daily with meals - Continue Ozempic 0.5 mg weekly  - Stop Novolin-N    EDUCATION / INSTRUCTIONS: BG monitoring instructions: Patient is instructed to check her blood sugars 3 times a week . Call [facility] if: BG persistently < 70 or > 300. I reviewed the Rule of 15 for the treatment of hypoglycemia in detail with the patient. Literature supplied.   2) Diabetic complications:  Eye: Does  have known diabetic retinopathy.  Neuro/ Feet: Does not have known diabetic peripheral neuropathy. Renal: Patient does not have known baseline CKD. She is  on an ACEI/ARB at present.Check urine albumin/creatinine ratio yearly starting at time of diagnosis.   3) Lipids: Patient is on Atorvastatin, LDL at goal. We discussed cardiovascular benefits of statins with the pt , especially with FH of CVD    F/U in 3 months     Signed electronically by: Mack Guise, MD  [facility]   [facility] [address] [place], [place] [address] Phone: [phone] FAX: [phone]   CC: Rutherford Guys, MD [address] [place] [address] Phone: [phone]  Fax: [phone]    Return to Endocrinology clinic as below: Future Appointments  Date Time Provider [place]  02/22/2020  4:20 PM Rutherford Guys, MD PCP-PCP [facility]

## 2020-02-16 ENCOUNTER — Other Ambulatory Visit: Payer: Self-pay | Admitting: Family Medicine

## 2020-02-16 DIAGNOSIS — I10 Essential (primary) hypertension: Secondary | ICD-10-CM

## 2020-02-22 ENCOUNTER — Other Ambulatory Visit: Payer: Self-pay

## 2020-02-22 ENCOUNTER — Ambulatory Visit (INDEPENDENT_AMBULATORY_CARE_PROVIDER_SITE_OTHER): Payer: 59 | Admitting: Family Medicine

## 2020-02-22 ENCOUNTER — Encounter: Payer: Self-pay | Admitting: Family Medicine

## 2020-02-22 VITALS — BP 120/80 | HR 84 | Temp 97.9°F | Ht 63.0 in | Wt 156.0 lb

## 2020-02-22 DIAGNOSIS — I10 Essential (primary) hypertension: Secondary | ICD-10-CM | POA: Diagnosis not present

## 2020-02-22 DIAGNOSIS — E785 Hyperlipidemia, unspecified: Secondary | ICD-10-CM | POA: Diagnosis not present

## 2020-02-22 DIAGNOSIS — E1169 Type 2 diabetes mellitus with other specified complication: Secondary | ICD-10-CM | POA: Diagnosis not present

## 2020-02-22 DIAGNOSIS — Z794 Long term (current) use of insulin: Secondary | ICD-10-CM | POA: Diagnosis not present

## 2020-02-22 MED ORDER — METOPROLOL SUCCINATE ER 100 MG PO TB24: 100.0000 mg | ORAL_TABLET | Freq: Every day | ORAL | 5 refills | Status: DC

## 2020-02-22 MED ORDER — ATORVASTATIN CALCIUM 20 MG PO TABS: 20.0000 mg | ORAL_TABLET | Freq: Every day | ORAL | 5 refills | Status: DC

## 2020-02-22 MED ORDER — LISINOPRIL 40 MG PO TABS: 40.0000 mg | ORAL_TABLET | Freq: Every day | ORAL | 5 refills | Status: DC

## 2020-02-22 MED ORDER — AMLODIPINE BESYLATE 10 MG PO TABS: 10.0000 mg | ORAL_TABLET | Freq: Every day | ORAL | 5 refills | Status: DC

## 2020-02-22 MED ORDER — SPIRONOLACTONE 25 MG PO TABS: 25.0000 mg | ORAL_TABLET | Freq: Every day | ORAL | 5 refills | Status: DC

## 2020-02-22 MED ORDER — POTASSIUM CHLORIDE ER 10 MEQ PO TBCR: 10.0000 meq | EXTENDED_RELEASE_TABLET | Freq: Two times a day (BID) | ORAL | 1 refills | Status: DC

## 2020-02-22 NOTE — Progress Notes (Signed)
4/23/20214:51 PM  Yolanda Herrera 1971/10/23, 49 y.o., female [id number]  Chief Complaint  Patient presents with  . Diabetes    fasting am 140 to 260   . request for a different type of potassium    tab is too  big to swallow     HPI:   Patient is a 49 y.o. female with past medical history significant for DM2, HTN, HLPwho presents today forroutine followup  Last OV Jan 2021 - referred to endo Saw Dr Kelton Pillar march 2021 - stop glimeperide, stopped novoli-N, started lantus 16 units, increased metformin 1014m BID, cont ozempic, fu 3 months  Patient overall doing well  cbgs are slowly coming down Having decreased appetite as expected on ozempic Denies any low cbgs  Has completed covid vaccine  Lab Results  Component Value Date   HGBA1C 14.9 (H) 11/23/2019   HGBA1C 12.0 (H) 05/21/2019   HGBA1C 14.0 (A) 01/17/2019   Lab Results  Component Value Date   LDLCALC 63 11/23/2019   CREATININE 0.90 11/23/2019    Depression screen PHQ 2/9 02/22/2020 11/23/2019 05/25/2019  Decreased Interest 0 0 0  Down, Depressed, Hopeless 0 0 0  PHQ - 2 Score 0 0 0    Fall Risk  02/22/2020 11/23/2019 05/25/2019 02/28/2019 06/06/2018  Falls in the past year? 0 0 0 0 No  Number falls in past yr: 0 0 0 - -  Injury with Fall? 0 0 0 - -  Follow up Falls evaluation completed - Falls evaluation completed Falls evaluation completed -     No Known Allergies  Prior to Admission medications   Medication Sig Start Date End Date Taking? Authorizing Provider  amLODipine (NORVASC) 10 MG tablet Take 1 tablet (10 mg total) by mouth daily. 11/23/19  Yes SRutherford Guys MD  atorvastatin (LIPITOR) 20 MG tablet Take 1 tablet (20 mg total) by mouth daily. 11/23/19  Yes SRutherford Guys MD  blood glucose meter kit and supplies per insurance preference. Use up to three times daily as directed. E1.9, Z79.4 Please dispense lancets and glucose test stripes to match glucometer, 100 each 11/23/19  Yes SRutherford Guys MD  chlorthalidone (HYGROTON) 25 MG tablet Take 1 tablet (25 mg total) by mouth daily. 11/23/19 02/22/20 Yes SRutherford Guys MD  insulin glargine (LANTUS SOLOSTAR) 100 UNIT/ML Solostar Pen Inject 16 Units into the skin daily. 01/11/20  Yes Shamleffer, IMelanie Crazier MD  Insulin Pen Needle 32G X 4 MM MISC 1 Device by Does not apply route daily. 01/11/20  Yes Shamleffer, IMelanie Crazier MD  Insulin Syringes, Disposable, U-100 0.5 ML MISC Use twice with a day with NPH. Dx E11.65, Z79.4 02/28/19  Yes SRutherford Guys MD  lisinopril (ZESTRIL) 40 MG tablet Take 1 tablet (40 mg total) by mouth daily. 11/23/19  Yes SRutherford Guys MD  metformin (FORTAMET) 1000 MG (OSM) 24 hr tablet Take 1 tablet (1,000 mg total) by mouth 2 (two) times daily with a meal. 01/11/20  Yes Shamleffer, IMelanie Crazier MD  metoprolol succinate (TOPROL-XL) 100 MG 24 hr tablet Take 1 tablet (100 mg total) by mouth daily. Take with or immediately following a meal. 11/23/19  Yes SRutherford Guys MD  omeprazole (PRILOSEC) 20 MG capsule Take 1 capsule (20 mg total) by mouth 2 (two) times daily before a meal. 11/23/19  Yes SRutherford Guys MD  potassium chloride SA (KLOR-CON) 20 MEQ tablet Take 1 tablet (20 mEq total) by mouth daily. 11/14/19  Yes SPamella Pert IBenay Spice  M, MD  Semaglutide,0.25 or 0.5MG/DOS, (OZEMPIC, 0.25 OR 0.5 MG/DOSE,) 2 MG/1.5ML SOPN Inject 0.5 mg into the skin once a week. 01/11/20  Yes Shamleffer, Melanie Crazier, MD  ACCU-CHEK GUIDE test strip USE UP TO THREE TIMES DAILY 11/24/19   [provider]  spironolactone (ALDACTONE) 25 MG tablet Take 1 tablet (25 mg total) by mouth daily. 11/23/19 02/21/20  Rutherford Guys, MD    Past Medical History:  Diagnosis Date  . Diabetes mellitus without complication ([place])   . Hypertension     Past Surgical History:  Procedure Laterality Date  . CESAREAN SECTION    . UTERINE FIBROID SURGERY      Social History   Tobacco Use  . Smoking status: Never Smoker  .  Smokeless tobacco: Never Used  Substance Use Topics  . Alcohol use: No    Family History  Problem Relation Age of Onset  . Diabetes Mother   . Diabetes Father   . Kidney disease Father   . Hypertension Father   . Hypertension Sister   . Stroke Sister     Review of Systems  Constitutional: Negative for chills and fever.  Eyes: Negative for blurred vision and double vision.  Respiratory: Negative for cough and shortness of breath.   Cardiovascular: Negative for chest pain, palpitations and leg swelling.  Gastrointestinal: Negative for abdominal pain, nausea and vomiting.  Neurological: Positive for dizziness (short room spinning sensation when she stands too quickly, intermittently).     OBJECTIVE:  Today's Vitals   02/22/20 1653  BP: 120/80  Pulse: 84  Temp: 97.9 F (36.6 C)  SpO2: 100%  Weight: 156 lb (70.8 kg)  Height: 5' 3"  (1.6 m)   Body mass index is 27.63 kg/m.   Physical Exam Vitals and nursing note reviewed.  Constitutional:      Appearance: She is well-developed.  HENT:     Head: Normocephalic and atraumatic.     Mouth/Throat:     Pharynx: No oropharyngeal exudate.  Eyes:     General: No scleral icterus.    Conjunctiva/sclera: Conjunctivae normal.     Pupils: Pupils are equal, round, and reactive to light.  Cardiovascular:     Rate and Rhythm: Normal rate and regular rhythm.     Heart sounds: Normal heart sounds. No murmur. No friction rub. No gallop.   Pulmonary:     Effort: Pulmonary effort is normal.     Breath sounds: Normal breath sounds. No wheezing or rales.  Musculoskeletal:     Cervical back: Neck supple.  Skin:    General: Skin is warm and dry.  Neurological:     Mental Status: She is alert and oriented to person, place, and time.     No results found for this or any previous visit (from the past 24 hour(s)).  No results found.   ASSESSMENT and PLAN  1. Essential hypertension Controlled. Continue current regime. Discussed  orthostatic hypotension, push fluids, RTC precautions reviewed - Basic metabolic panel - amLODipine (NORVASC) 10 MG tablet; Take 1 tablet (10 mg total) by mouth daily. - lisinopril (ZESTRIL) 40 MG tablet; Take 1 tablet (40 mg total) by mouth daily. - metoprolol succinate (TOPROL-XL) 100 MG 24 hr tablet; Take 1 tablet (100 mg total) by mouth daily. Take with or immediately following a meal.  2. Hyperlipidemia, unspecified hyperlipidemia type Controlled. Continue current regime.  - atorvastatin (LIPITOR) 20 MG tablet; Take 1 tablet (20 mg total) by mouth daily.  3. Type 2 diabetes mellitus  with other specified complication, with long-term current use of insulin ([place]) Managed by endo  Other orders - ACCU-CHEK GUIDE test strip; USE UP TO THREE TIMES DAILY - spironolactone (ALDACTONE) 25 MG tablet; Take 1 tablet (25 mg total) by mouth daily. - potassium chloride (KLOR-CON) 10 MEQ tablet; Take 1 tablet (10 mEq total) by mouth 2 (two) times daily.  Return in about 6 months (around 08/23/2020).    Rutherford Guys, MD [facility] at [place], [place] [address] Ph.  [phone] Fax [phone]

## 2020-02-22 NOTE — Patient Instructions (Signed)
° ° ° °  If you have lab work done today you will be contacted with your lab results within the next 2 weeks.  If you have not heard from us then please contact us. The fastest way to get your results is to register for My Chart. ° ° °IF you received an x-ray today, you will receive an invoice from [facility]. Please contact [facility] at [phone] with questions or concerns regarding your invoice.  ° °IF you received labwork today, you will receive an invoice from [facility]. Please contact [facility] at [phone] with questions or concerns regarding your invoice.  ° °Our billing staff will not be able to assist you with questions regarding bills from these companies. ° °You will be contacted with the lab results as soon as they are available. The fastest way to get your results is to activate your My Chart account. Instructions are located on the last page of this paperwork. If you have not heard from us regarding the results in 2 weeks, please contact this office. °  ° ° ° °

## 2020-02-23 LAB — BASIC METABOLIC PANEL
BUN/Creatinine Ratio: 12 (ref 9–23)
BUN: 14 mg/dL (ref 6–24)
CO2: 23 mmol/L (ref 20–29)
Calcium: 10.2 mg/dL (ref 8.7–10.2)
Chloride: 94 mmol/L — ABNORMAL LOW (ref 96–106)
Creatinine, Ser: 1.14 mg/dL — ABNORMAL HIGH (ref 0.57–1.00)
GFR calc Af Amer: 65 mL/min/{1.73_m2} (ref >59)
GFR calc non Af Amer: 57 mL/min/{1.73_m2} — ABNORMAL LOW (ref >59)
Glucose: 401 mg/dL — ABNORMAL HIGH (ref 65–99)
Potassium: 3.9 mmol/L (ref 3.5–5.2)
Sodium: 135 mmol/L (ref 134–144)

## 2020-03-03 ENCOUNTER — Other Ambulatory Visit: Payer: Self-pay | Admitting: Family Medicine

## 2020-04-11 ENCOUNTER — Encounter: Payer: Self-pay | Admitting: Internal Medicine

## 2020-04-11 ENCOUNTER — Other Ambulatory Visit: Payer: Self-pay

## 2020-04-11 ENCOUNTER — Ambulatory Visit (INDEPENDENT_AMBULATORY_CARE_PROVIDER_SITE_OTHER): Payer: 59 | Admitting: Internal Medicine

## 2020-04-11 VITALS — BP 150/90 | HR 78 | Ht 63.0 in | Wt 157.0 lb

## 2020-04-11 DIAGNOSIS — I10 Essential (primary) hypertension: Secondary | ICD-10-CM | POA: Diagnosis not present

## 2020-04-11 DIAGNOSIS — Z794 Long term (current) use of insulin: Secondary | ICD-10-CM

## 2020-04-11 DIAGNOSIS — E1165 Type 2 diabetes mellitus with hyperglycemia: Secondary | ICD-10-CM

## 2020-04-11 LAB — GLUCOSE, POCT (MANUAL RESULT ENTRY): POC Glucose: 182 mg/dl — AB (ref 70–99)

## 2020-04-11 LAB — POCT GLYCOSYLATED HEMOGLOBIN (HGB A1C): Hemoglobin A1C: 11.6 % — AB (ref 4.0–5.6)

## 2020-04-11 MED ORDER — OZEMPIC (1 MG/DOSE) 2 MG/1.5ML ~~LOC~~ SOPN: 1.0000 mg | PEN_INJECTOR | SUBCUTANEOUS | 3 refills | Status: DC

## 2020-04-11 NOTE — Patient Instructions (Addendum)
-   Continue  Lantus 16 units daily  - Continue  Metformin 1000 mg Twice daily with meals - Increase Ozempic to 1 mg weekly         - HOW TO TREAT LOW BLOOD SUGARS (Blood sugar LESS THAN 70 MG/DL)  Please follow the RULE OF 15 for the treatment of hypoglycemia treatment (when your (blood sugars are less than 70 mg/dL)    STEP 1: Take 15 grams of carbohydrates when your blood sugar is low, which includes:   3-4 GLUCOSE TABS  OR  3-4 OZ OF JUICE OR REGULAR SODA OR  ONE TUBE OF GLUCOSE GEL     STEP 2: RECHECK blood sugar in 15 MINUTES STEP 3: If your blood sugar is still low at the 15 minute recheck --> then, go back to STEP 1 and treat AGAIN with another 15 grams of carbohydrates.

## 2020-04-11 NOTE — Progress Notes (Signed)
Name: Yolanda Herrera  Age/ Sex: 49 y.o., female   MRN/ DOB: [id number], [birth year]     PCP: Rutherford Guys, MD   Reason for Endocrinology Evaluation: Type 2 Diabetes Mellitus  Initial Endocrine Consultative Visit: 01/11/2020    PATIENT IDENTIFIER: Yolanda Herrera is a 50 y.o. female with a past medical history of DM and dyslipidemia. The patient has followed with Endocrinology clinic since 01/11/2020 for consultative assistance with management of her diabetes.  DIABETIC HISTORY:  Yolanda Herrera was diagnosed with DM in 2011.  She has been on Metformin since her diagnosis, as well as glimepiride.  Vania Rea was cost prohibitive.  Ozempic was started in 12/2019.  Her hemoglobin A1c has ranged from 9.2% in 2019, peaking at 14.9% in 2021.   On her initial visit to our clinic, her A1c was 14.9%   , she was on Metformin, glimepiride, Ozempic, and NPH.  We will stop glimepiride and NPH, we increase Metformin, continue to Ozempic and started Lantus.  Works at [employer] at [employer] living   7 Am to 3 pm   SUBJECTIVE:   During the last visit (01/11/2020): A1c 14.9%, we stopped glimepiride and NPH.  Started Lantus, continued Ozempic and increase Metformin.  Today (04/11/2020): Yolanda Herrera is here for follow-up on diabetes management. She checks her blood sugars occasionally . The patient has not had hypoglycemic episodes since the last clinic visit.  HOME DIABETES REGIMEN:  Lantus 16 units daily  Metformin 1000 mg Twice daily with meals Ozempic 0.5 mg weekly     Statin: Yes ACE-I/ARB: Yes   METER DOWNLOAD SUMMARY: Did not bring     DIABETIC COMPLICATIONS: Microvascular complications:  Retinopathy ? Right,  Denies: neuropathy  Last eye exam: Completed 12/2019  Macrovascular complications:   Denies: CAD, PVD, CVA  HISTORY:  Past Medical History:  Past Medical History:  Diagnosis Date  . Diabetes mellitus without complication ([place])   . Hypertension     Past Surgical History:  Past Surgical History:  Procedure Laterality Date  . CESAREAN SECTION    . UTERINE FIBROID SURGERY     Social History:  reports that she has never smoked. She has never used smokeless tobacco. She reports that she does not drink alcohol and does not use drugs. Family History:  Family History  Problem Relation Age of Onset  . Diabetes Mother   . Diabetes Father   . Kidney disease Father   . Hypertension Father   . Hypertension Sister   . Stroke Sister      HOME MEDICATIONS: Allergies as of 04/11/2020   No Known Allergies     Medication List       Accurate as of April 11, 2020  3:36 PM. If you have any questions, ask your nurse or doctor.        Accu-Chek Guide test strip Generic drug: glucose blood USE UP TO THREE TIMES DAILY   amLODipine 10 MG tablet Commonly known as: NORVASC Take 1 tablet (10 mg total) by mouth daily.   atorvastatin 20 MG tablet Commonly known as: LIPITOR Take 1 tablet (20 mg total) by mouth daily.   blood glucose meter kit and supplies per insurance preference. Use up to three times daily as directed. E1.9, Z79.4 Please dispense lancets and glucose test stripes to match glucometer, 100 each   chlorthalidone 25 MG tablet Commonly known as: HYGROTON Take 1 tablet (25 mg total) by mouth daily.   Insulin Pen Needle 32G X 4 MM Misc  1 Device by Does not apply route daily.   Insulin Syringes (Disposable) U-100 0.5 ML Misc Use twice with a day with NPH. Dx E11.65, Z79.4   Lantus SoloStar 100 UNIT/ML Solostar Pen Generic drug: insulin glargine Inject 16 Units into the skin daily.   lisinopril 40 MG tablet Commonly known as: ZESTRIL Take 1 tablet (40 mg total) by mouth daily.   metformin 1000 MG (OSM) 24 hr tablet Commonly known as: FORTAMET Take 1 tablet (1,000 mg total) by mouth 2 (two) times daily with a meal.   metoprolol succinate 100 MG 24 hr tablet Commonly known as: TOPROL-XL Take 1 tablet (100 mg  total) by mouth daily. Take with or immediately following a meal.   omeprazole 20 MG capsule Commonly known as: PRILOSEC Take 1 capsule (20 mg total) by mouth 2 (two) times daily before a meal.   Ozempic (0.25 or 0.5 MG/DOSE) 2 MG/1.5ML Sopn Generic drug: Semaglutide(0.25 or 0.5MG/DOS) Inject 0.5 mg into the skin once a week.   potassium chloride 10 MEQ tablet Commonly known as: KLOR-CON Take 1 tablet (10 mEq total) by mouth 2 (two) times daily.   spironolactone 25 MG tablet Commonly known as: ALDACTONE Take 1 tablet (25 mg total) by mouth daily.        OBJECTIVE:   Vital Signs: BP (!) 150/92 (BP Location: Left Arm, Patient Position: Sitting, Cuff Size: Normal)   Pulse 78   Ht 5' 3"  (1.6 m)   Wt 157 lb (71.2 kg)   LMP 01/18/2018 (Approximate)   SpO2 98%   BMI 27.81 kg/m   Wt Readings from Last 3 Encounters:  04/11/20 157 lb (71.2 kg)  02/22/20 156 lb (70.8 kg)  01/11/20 165 lb (74.8 kg)     Exam: General: Pt appears well and is in NAD  Lungs: Clear with good BS bilat with no rales, rhonchi, or wheezes  Heart: RRR with normal S1 and S2 and no gallops; no murmurs; no rub  Abdomen: Normoactive bowel sounds, soft, nontender, without masses or organomegaly palpable  Extremities: No pretibial edema. .  Neuro: MS is good with appropriate affect, pt is alert and Ox3    DM foot exam: 01/11/2020  The skin of the feet is intact without sores or ulcerations. The pedal pulses are 2+ on right and 2+ on left. The sensation is intact to a screening 5.07, 10 gram monofilament bilaterally   DATA REVIEWED:  Lab Results  Component Value Date   HGBA1C 11.6 (A) 04/11/2020   HGBA1C 14.9 (H) 11/23/2019   HGBA1C 12.0 (H) 05/21/2019   Lab Results  Component Value Date   LDLCALC 63 11/23/2019   CREATININE 1.14 (H) 02/22/2020   Lab Results  Component Value Date   MICRALBCREAT 7 05/25/2019     Lab Results  Component Value Date   CHOL 115 11/23/2019   HDL 34 (L)  11/23/2019   LDLCALC 63 11/23/2019   TRIG 91 11/23/2019   CHOLHDL 3.4 11/23/2019         ASSESSMENT / PLAN / RECOMMENDATIONS:   1) Type 2 Diabetes Mellitus, poorly controlled, With retinopathic complications - Most recent A1c of  11.6%. Goal A1c < 7.0 %.   -A1c down from 14.9% -I have praised the patient on medication adherence -I am going to make the following adjustments  MEDICATIONS: - Continue  Lantus 16 units daily  - Continue  Metformin 1000 mg Twice daily with meals - Increase Ozempic to 1 mg weekly    EDUCATION /  INSTRUCTIONS: BG monitoring instructions: Patient is instructed to check her blood sugars 1 times a day. Call [facility] if: BG persistently < 70  I reviewed the Rule of 15 for the treatment of hypoglycemia in detail with the patient. Literature supplied.    2) Diabetic complications:  Eye: Does have known diabetic retinopathy.  Neuro/ Feet: Does not have known diabetic peripheral neuropathy. Renal: Patient does not have known baseline CKD. She is  on an ACEI/ARB at present  3. HTN  - Home BP  120/80 's  - She is not on Hydrochlorthalidone , she is not sure if her PCP wanted her to stop it or she just stopped getting refills on it.  I have advised the patient to check with her PCP on this.   F/U in 3 months  Signed electronically by: Mack Guise, MD  [facility]  [facility] [address] [place], [place] [address] Phone: [phone] FAX: [phone]   CC: Rutherford Guys, MD [address] [place] [address] Phone: [phone]  Fax: [phone]  Return to Endocrinology clinic as below: Future Appointments  Date Time Provider [place]  08/22/2020  3:40 PM Rutherford Guys, MD PCP-PCP [facility]

## 2020-06-05 ENCOUNTER — Other Ambulatory Visit: Payer: Self-pay | Admitting: Family Medicine

## 2020-06-05 NOTE — Telephone Encounter (Signed)
Requested Prescriptions  Pending Prescriptions Disp Refills   potassium chloride (KLOR-CON) 10 MEQ tablet [Pharmacy Med Name: potassium chloride ER 10 mEq tablet,extended release] 180 tablet 0    Sig: Take 1 tablet (10 mEq total) by mouth 2 (two) times daily.     Endocrinology:  Minerals - Potassium Supplementation Failed - 06/05/2020  5:10 PM      Failed - Cr in normal range and within 360 days    Creatinine, Ser  Date Value Ref Range Status  02/22/2020 1.14 (H) 0.57 - 1.00 mg/dL Final   Creatinine, Urine  Date Value Ref Range Status  12/11/2010 28.8 mg/dL Final         Passed - K in normal range and within 360 days    Potassium  Date Value Ref Range Status  02/22/2020 3.9 3.5 - 5.2 mmol/L Final         Passed - Valid encounter within last 12 months    Recent Outpatient Visits          3 months ago Essential hypertension   [facility], Meda Coffee, MD   6 months ago Type 2 diabetes mellitus with other specified complication, with long-term current use of insulin [facility])   [facility], Meda Coffee, MD   1 year ago Type 2 diabetes mellitus with other specified complication, with long-term current use of insulin [facility])   [facility], Meda Coffee, MD   1 year ago Type 2 diabetes mellitus with other specified complication, with long-term current use of insulin [facility])   [facility], Meda Coffee, MD   1 year ago Type 2 diabetes mellitus with other specified complication, with long-term current use of insulin [facility])   [facility], Meda Coffee, MD      Future Appointments            In 1 week Alwyn Ren Sandria Bales, MD [facility] at [place], [facility]   In 2 months Myles Lipps, MD Primary Care at [place], [facility]            Cr is elevated 02/22/20 Per Dr note will monitor and order for potassium was placed on 02/22/20. Gave 90 day supply. Patient has appointment in 2 months.

## 2020-06-13 ENCOUNTER — Ambulatory Visit (INDEPENDENT_AMBULATORY_CARE_PROVIDER_SITE_OTHER): Payer: 59 | Admitting: Family Medicine

## 2020-06-13 ENCOUNTER — Other Ambulatory Visit: Payer: Self-pay

## 2020-06-13 ENCOUNTER — Encounter: Payer: Self-pay | Admitting: Family Medicine

## 2020-06-13 ENCOUNTER — Encounter: Payer: 59 | Admitting: Family Medicine

## 2020-06-13 VITALS — BP 157/101 | HR 83 | Temp 97.6°F | Ht 63.0 in | Wt 152.4 lb

## 2020-06-13 DIAGNOSIS — Z0001 Encounter for general adult medical examination with abnormal findings: Secondary | ICD-10-CM

## 2020-06-13 DIAGNOSIS — Z23 Encounter for immunization: Secondary | ICD-10-CM

## 2020-06-13 DIAGNOSIS — Z0000 Encounter for general adult medical examination without abnormal findings: Secondary | ICD-10-CM

## 2020-06-13 DIAGNOSIS — E1169 Type 2 diabetes mellitus with other specified complication: Secondary | ICD-10-CM | POA: Diagnosis not present

## 2020-06-13 DIAGNOSIS — I10 Essential (primary) hypertension: Secondary | ICD-10-CM | POA: Diagnosis not present

## 2020-06-13 DIAGNOSIS — Z794 Long term (current) use of insulin: Secondary | ICD-10-CM | POA: Diagnosis not present

## 2020-06-13 DIAGNOSIS — Z1159 Encounter for screening for other viral diseases: Secondary | ICD-10-CM

## 2020-06-13 DIAGNOSIS — Z111 Encounter for screening for respiratory tuberculosis: Secondary | ICD-10-CM | POA: Diagnosis not present

## 2020-06-13 DIAGNOSIS — E785 Hyperlipidemia, unspecified: Secondary | ICD-10-CM

## 2020-06-13 LAB — POCT GLYCOSYLATED HEMOGLOBIN (HGB A1C): Hemoglobin A1C: 9.8 % — AB (ref 4.0–5.6)

## 2020-06-13 LAB — GLUCOSE, POCT (MANUAL RESULT ENTRY): POC Glucose: 114 mg/dl — AB (ref 70–99)

## 2020-06-13 NOTE — Patient Instructions (Addendum)
QuantiFERON gold test for tuberculosis is pending and you will need to attach the result to your physical exam form.  You are due for an annual flu vaccine (influenza).  Those should be coming out any day now, and you should make sure you get your vaccination so you have a record of that for the [facility].  Continue to follow-up with your diabetic specialist to try and gain better control of your diabetes  Your blood pressure is high today.  This is probably since you have not taking her blood pressure medicines today yet.  However you should monitor it over the next couple of weeks and if it continues to remain high contact Dr. Leretha Pol about your medicine doses.  Return in about 3 months to follow-up on your blood pressure.   If you have lab work done today you will be contacted with your lab results within the next 2 weeks.  If you have not heard from [place] then please contact us. The fastest way to get your results is to register for My Chart.   IF you received an x-ray today, you will receive an invoice from [facility]. Please contact [facility] at [phone] with questions or concerns regarding your invoice.   IF you received labwork today, you will receive an invoice from [place]. Please contact [facility] at [phone] with questions or concerns regarding your invoice.   Our billing staff will not be able to assist you with questions regarding bills from these companies.  You will be contacted with the lab results as soon as they are available. The fastest way to get your results is to activate your My Chart account. Instructions are located on the last page of this paperwork. If you have not heard from [place] regarding the results in 2 weeks, please contact this office.     Tuberculosis Risk Questionnaire  1. No Were you born outside the [place] in one of the following parts of the world: [place], [place], [place], [place] or [place]?    2. No  Have you traveled outside the [place] and lived for more than one month in one of the following parts of the world: [place], [place], [place], [place] or [place]?    3. No Do you have a compromised immune system such as from any of the following conditions:HIV/AIDS, organ or bone marrow transplantation, diabetes, immunosuppressive medicines (e.g. Prednisone, Remicaide), leukemia, lymphoma, cancer of the head or neck, gastrectomy or jejunal bypass, end-stage renal disease (on dialysis), or silicosis?     4. Yes  Have you ever or do you plan on working in: a residential care center, a health care facility, a jail or prison or homeless shelter?    5. No Have you ever: injected illegal drugs, used crack cocaine, lived in a homeless shelter  or been in jail or prison?     6. No Have you ever been exposed to anyone with infectious tuberculosis?  7. No Have you ever had a BCG vaccine? (BCG is a vaccine for tuberculosis  (TB) used in OTHER countries, NOT in the [place]).  8. No Have you ever been advised by a health care provider NOT to have a TB skin test?  9. No Have you ever had a POSITIVE TB skin test?  IF SO, when? n/a  IF SO, were you treated with INH? n/a  IF SO, where? n/a  Tuberculosis Symptom Questionnaire  Do you currently have any of the following symptoms?  1. No Unexplained cough lasting more than 3 weeks?   2. No Unexplained fever lasting more than 3 weeks.   3. No Night Sweats (sweating that leaves the bedclothes and sheets wet)     4. No Shortness of Breath   5. No Chest Pain   6. No Unintentional weight loss    7. No Unexplained fatigue (very tired for no reason)

## 2020-06-13 NOTE — Progress Notes (Signed)
Patient ID: Yolanda Herrera, female    DOB: [birth year]  Age: 49 y.o. MRN: [id number]  Chief Complaint  Patient presents with  . Annual Exam    Subjective:   Patient is here for complete physical examination.  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  Patient is originally from [place].  Has been in the [place] about 15 years.  Her mother also lives here.  She has been fairly healthy with the exception of having diabetes and hypertension.  She currently sees an endocrinologist for her diabetes which has not been in good control.  No major acute complaints today  Past medical history: Medical illnesses: Diabetes, hypertension Surgeries: Fibroids, cesarean section Gravida 1 para 1 Medications: See list Allergies: None known  Social history: Does not smoke, drink, or use drugs.  Has worked various jobs.  Is getting ready to enter nursing school as noted above.  Family history: Father died of diabetes and renal failure.  Mother is living, diabetic, 66 years old.  Review of systems: Constitutional: Unremarkable HEENT: Unremarkable.  Saw her eye doctor yesterday Cardiovascular: Unremarkable Respiratory: Unremarkable GI: Unremarkable GU: Unremarkable Musculoskeletal: Unremarkable Neurologic: Unremarkable Dermatologic: Unremarkable Psychiatric: Unremarkable Endocrine: Diabetic, sees endocrinologist    Current allergies, medications, problem list, past/family and social histories reviewed.  Objective:  BP (!) 157/101 (BP Location: Left Arm, Patient Position: Sitting, Cuff Size: Large)   Pulse 83   Temp 97.6 F (36.4 C) (Temporal)   Ht 5' 3"  (1.6 m)   Wt 152 lb 6.4 oz (69.1 kg)   LMP 01/18/2018 (Approximate)   SpO2 98%   BMI 27.00 kg/m  Blood pressure elevated as noted.  I repeated it and got 166/108.  She has not taken her medicine today yet because she thought should be getting blood work done.  TMs normal.  Eyes PERRL.  Throat clear neck  supple without nodes.  Chest clear.  Heart rate without murmurs.  Abdomen soft without mass or tenderness.  Extremities unremarkable.  Skin unremarkable.  Assessment & Plan:   Assessment: 1. Need for Tdap vaccination   2. Essential hypertension   3. Hyperlipidemia, unspecified hyperlipidemia type   4. Type 2 diabetes mellitus with other specified complication, with long-term current use of insulin ([place])   5. Need for hepatitis C screening test   6. Annual physical exam       Plan: See instructions.  Form completed for school.  Orders Placed This Encounter  Procedures  . Tdap vaccine greater than or equal to 7yo IM  . Hepatitis C antibody  . CBC  . CMP14+EGFR  . Lipid panel  . TSH  . POCT glycosylated hemoglobin (Hb A1C)  . POCT glucose (manual entry)    No orders of the defined types were placed in this encounter.        Patient Instructions       If you have lab work done today you will be contacted with your lab results within the next 2 weeks.  If you have not heard from [place] then please contact us. The fastest way to get your results is to register for My Chart.   IF you received an x-ray today, you will receive an invoice from [facility]. Please contact [facility] at [phone] with questions or concerns regarding your invoice.   IF you received labwork today, you will receive an invoice from [facility]. Please contact [facility] at [phone] with questions or concerns regarding your invoice.   Our billing  staff will not be able to assist you with questions regarding bills from these companies.  You will be contacted with the lab results as soon as they are available. The fastest way to get your results is to activate your My Chart account. Instructions are located on the last page of this paperwork. If you have not heard from [place] regarding the results in 2 weeks, please contact this office.     Tuberculosis Risk Questionnaire  1. No  Were you born outside the [place] in one of the following parts of the world: [place], [place], [place], [place] or [place]?    2. No Have you traveled outside the [place] and lived for more than one month in one of the following parts of the world: [place], [place], [place], [place] or [place]?    3. No Do you have a compromised immune system such as from any of the following conditions:HIV/AIDS, organ or bone marrow transplantation, diabetes, immunosuppressive medicines (e.g. Prednisone, Remicaide), leukemia, lymphoma, cancer of the head or neck, gastrectomy or jejunal bypass, end-stage renal disease (on dialysis), or silicosis?     4. Yes  Have you ever or do you plan on working in: a residential care center, a health care facility, a jail or prison or homeless shelter?    5. No Have you ever: injected illegal drugs, used crack cocaine, lived in a homeless shelter  or been in jail or prison?     6. No Have you ever been exposed to anyone with infectious tuberculosis?  7. No Have you ever had a BCG vaccine? (BCG is a vaccine for tuberculosis  (TB) used in OTHER countries, NOT in the [place]).  8. No Have you ever been advised by a health care provider NOT to have a TB skin test?  9. No Have you ever had a POSITIVE TB skin test?  IF SO, when? n/a  IF SO, were you treated with INH? n/a  IF SO, where? n/a  Tuberculosis Symptom Questionnaire  Do you currently have any of the following symptoms?  1. No Unexplained cough lasting more than 3 weeks?   2. No Unexplained fever lasting more than 3 weeks.   3. No Night Sweats (sweating that leaves the bedclothes and sheets wet)     4. No Shortness of Breath   5. No Chest Pain   6. No Unintentional weight loss    7. No Unexplained fatigue (very tired for no reason)      No follow-ups on file.   Ruben Reason, MD 06/13/2020

## 2020-06-13 NOTE — Addendum Note (Signed)
Addended by: Lorenda Hatchet R on: 06/13/2020 04:43 PM   Modules accepted: Orders

## 2020-06-14 LAB — CMP14+EGFR
ALT: 14 IU/L (ref 0–32)
AST: 10 IU/L (ref 0–40)
Albumin/Globulin Ratio: 1.7 (ref 1.2–2.2)
Albumin: 4.7 g/dL (ref 3.8–4.8)
Alkaline Phosphatase: 99 IU/L (ref 48–121)
BUN/Creatinine Ratio: 9 (ref 9–23)
BUN: 8 mg/dL (ref 6–24)
Bilirubin Total: 0.3 mg/dL (ref 0.0–1.2)
CO2: 25 mmol/L (ref 20–29)
Calcium: 10 mg/dL (ref 8.7–10.2)
Chloride: 102 mmol/L (ref 96–106)
Creatinine, Ser: 0.87 mg/dL (ref 0.57–1.00)
GFR calc Af Amer: 90 mL/min/{1.73_m2} (ref >59)
GFR calc non Af Amer: 78 mL/min/{1.73_m2} (ref >59)
Globulin, Total: 2.7 g/dL (ref 1.5–4.5)
Glucose: 150 mg/dL — ABNORMAL HIGH (ref 65–99)
Potassium: 4.1 mmol/L (ref 3.5–5.2)
Sodium: 140 mmol/L (ref 134–144)
Total Protein: 7.4 g/dL (ref 6.0–8.5)

## 2020-06-14 LAB — LIPID PANEL
Chol/HDL Ratio: 3.6 ratio (ref 0.0–4.4)
Cholesterol, Total: 156 mg/dL (ref 100–199)
HDL: 43 mg/dL (ref >39)
LDL Chol Calc (NIH): 100 mg/dL — ABNORMAL HIGH (ref 0–99)
Triglycerides: 66 mg/dL (ref 0–149)
VLDL Cholesterol Cal: 13 mg/dL (ref 5–40)

## 2020-06-14 LAB — CBC
Hematocrit: 42.5 % (ref 34.0–46.6)
Hemoglobin: 13.7 g/dL (ref 11.1–15.9)
MCH: 25.2 pg — ABNORMAL LOW (ref 26.6–33.0)
MCHC: 32.2 g/dL (ref 31.5–35.7)
MCV: 78 fL — ABNORMAL LOW (ref 79–97)
Platelets: 255 10*3/uL (ref 150–450)
RBC: 5.44 x10E6/uL — ABNORMAL HIGH (ref 3.77–5.28)
RDW: 14.1 % (ref 11.7–15.4)
WBC: 7 10*3/uL (ref 3.4–10.8)

## 2020-06-14 LAB — TSH: TSH: 0.751 u[IU]/mL (ref 0.450–4.500)

## 2020-06-14 LAB — HEPATITIS C ANTIBODY: Hep C Virus Ab: <0.1 s/co ratio (ref 0.0–0.9)

## 2020-06-16 LAB — QUANTIFERON-TB GOLD PLUS
QuantiFERON Mitogen Value: >10 IU/mL
QuantiFERON Nil Value: 0.09 IU/mL
QuantiFERON TB1 Ag Value: 0.23 IU/mL
QuantiFERON TB2 Ag Value: 0.26 IU/mL
QuantiFERON-TB Gold Plus: NEGATIVE

## 2020-06-17 ENCOUNTER — Telehealth: Payer: Self-pay | Admitting: Family Medicine

## 2020-06-17 NOTE — Telephone Encounter (Signed)
Pt is wanting a cb concerning her most recent lab results. Please advise at [phone].

## 2020-06-17 NOTE — Telephone Encounter (Signed)
Pt requesting Lab results.

## 2020-06-18 NOTE — Telephone Encounter (Signed)
Dr Leretha Pol, the patient is requesting a copy of labs result ordered by Dr Alwyn Ren on 06/14/2019. Since Dr Alwyn Ren is not here, please review. She will stop by today to pick up the copies. Thank you

## 2020-06-18 NOTE — Telephone Encounter (Signed)
Pt called back requesting printed copies of her lab results. Please assist. Pt will pick up this afternoon

## 2020-06-19 NOTE — Telephone Encounter (Signed)
Patient's concern/request has been addressed already by another provider or staff member. 

## 2020-06-19 NOTE — Progress Notes (Signed)
Call: Tuberculosis test is negative.  Patient needs to report on a form that we partially filled out.  I am not sure she already came in to get that done, but if not we need to find the form and completed for her.Janace Hoard, MD

## 2020-07-21 ENCOUNTER — Telehealth: Payer: Self-pay | Admitting: *Deleted

## 2020-07-21 NOTE — Telephone Encounter (Signed)
Schedule a mammogram at the mobile clinic 9-27 Primary Care at [facility] 9-27

## 2020-08-01 ENCOUNTER — Encounter: Payer: Self-pay | Admitting: Internal Medicine

## 2020-08-01 ENCOUNTER — Ambulatory Visit (INDEPENDENT_AMBULATORY_CARE_PROVIDER_SITE_OTHER): Payer: 59 | Admitting: Internal Medicine

## 2020-08-01 ENCOUNTER — Other Ambulatory Visit: Payer: Self-pay

## 2020-08-01 ENCOUNTER — Other Ambulatory Visit: Payer: Self-pay | Admitting: Family Medicine

## 2020-08-01 VITALS — BP 152/90 | HR 89 | Ht 63.0 in | Wt 152.0 lb

## 2020-08-01 DIAGNOSIS — Z794 Long term (current) use of insulin: Secondary | ICD-10-CM

## 2020-08-01 DIAGNOSIS — E785 Hyperlipidemia, unspecified: Secondary | ICD-10-CM

## 2020-08-01 DIAGNOSIS — I10 Essential (primary) hypertension: Secondary | ICD-10-CM

## 2020-08-01 DIAGNOSIS — E1165 Type 2 diabetes mellitus with hyperglycemia: Secondary | ICD-10-CM

## 2020-08-01 LAB — POCT GLYCOSYLATED HEMOGLOBIN (HGB A1C): Hemoglobin A1C: 9 % — AB (ref 4.0–5.6)

## 2020-08-01 LAB — POCT GLUCOSE (DEVICE FOR HOME USE): POC Glucose: 137 mg/dl — AB (ref 70–99)

## 2020-08-01 NOTE — Progress Notes (Signed)
Name: Yolanda Herrera  Age/ Sex: 49 y.o., female   MRN/ DOB: [id number], [birth year]     PCP: Rutherford Guys, MD   Reason for Endocrinology Evaluation: Type 2 Diabetes Mellitus  Initial Endocrine Consultative Visit: 01/11/2020    PATIENT IDENTIFIER: Yolanda Herrera is a 49 y.o. female with a past medical history of DM and dyslipidemia. The patient has followed with Endocrinology clinic since 01/11/2020 for consultative assistance with management of her diabetes.  DIABETIC HISTORY:  Yolanda Herrera was diagnosed with DM in 2011.  She has been on Metformin since her diagnosis, as well as glimepiride.  Yolanda Herrera was cost prohibitive.  Ozempic was started in 12/2019.  Her hemoglobin A1c has ranged from 9.2% in 2019, peaking at 14.9% in 2021.   On her initial visit to our clinic, her A1c was 14.9%   , she was on Metformin, glimepiride, Ozempic, and NPH.  We stopped  glimepiride and NPH, we increase Metformin, continue to Ozempic and started Lantus.  Works at [employer] at [employer] living   7 Am to 3 pm   SUBJECTIVE:   During the last visit (04/11/2020): A1c 10.6%, we continue Lantus and Metformin and increase Ozempic     Today (08/01/2020): Yolanda Herrera is here for follow-up on diabetes management. She checks her blood sugars twice a week  . The patient has not had hypoglycemic episodes since the last clinic visit.    HOME DIABETES REGIMEN:  Lantus 16 units daily  Metformin 1000 mg Twice daily with meals Ozempic 1 mg weekly     Statin: Yes ACE-I/ARB: Yes   METER DOWNLOAD SUMMARY: Did not bring     DIABETIC COMPLICATIONS: Microvascular complications:  Retinopathy ? Right,  Denies: neuropathy  Last eye exam: Completed 12/2019  Macrovascular complications:   Denies: CAD, PVD, CVA  HISTORY:  Past Medical History:  Past Medical History:  Diagnosis Date  . Diabetes mellitus without complication ([place])   . Hyperlipidemia   . Hypertension    Past  Surgical History:  Past Surgical History:  Procedure Laterality Date  . CESAREAN SECTION    . UTERINE FIBROID SURGERY     Social History:  reports that she has never smoked. She has never used smokeless tobacco. She reports that she does not drink alcohol and does not use drugs. Family History:  Family History  Problem Relation Age of Onset  . Diabetes Mother   . Diabetes Father   . Kidney disease Father   . Hypertension Father   . Hypertension Sister   . Stroke Sister      HOME MEDICATIONS: Allergies as of 08/01/2020   No Known Allergies     Medication List       Accurate as of August 01, 2020  1:03 PM. If you have any questions, ask your nurse or doctor.        Accu-Chek Guide test strip Generic drug: glucose blood USE UP TO THREE TIMES DAILY   amLODipine 10 MG tablet Commonly known as: NORVASC Take 1 tablet (10 mg total) by mouth daily.   atorvastatin 20 MG tablet Commonly known as: LIPITOR Take 1 tablet (20 mg total) by mouth daily.   blood glucose meter kit and supplies per insurance preference. Use up to three times daily as directed. E1.9, Z79.4 Please dispense lancets and glucose test stripes to match glucometer, 100 each   chlorthalidone 25 MG tablet Commonly known as: HYGROTON Take 1 tablet (25 mg total) by mouth daily.   Insulin  Pen Needle 32G X 4 MM Misc 1 Device by Does not apply route daily.   Insulin Syringes (Disposable) U-100 0.5 ML Misc Use twice with a day with NPH. Dx E11.65, Z79.4   Lantus SoloStar 100 UNIT/ML Solostar Pen Generic drug: insulin glargine Inject 16 Units into the skin daily.   lisinopril 40 MG tablet Commonly known as: ZESTRIL Take 1 tablet (40 mg total) by mouth daily.   metformin 1000 MG (OSM) 24 hr tablet Commonly known as: FORTAMET Take 1 tablet (1,000 mg total) by mouth 2 (two) times daily with a meal.   metoprolol succinate 100 MG 24 hr tablet Commonly known as: TOPROL-XL Take 1 tablet (100 mg total) by  mouth daily. Take with or immediately following a meal.   omeprazole 20 MG capsule Commonly known as: PRILOSEC Take 1 capsule (20 mg total) by mouth 2 (two) times daily before a meal.   Ozempic (1 MG/DOSE) 2 MG/1.5ML Sopn Generic drug: Semaglutide (1 MG/DOSE) Inject 0.75 mLs (1 mg total) into the skin once a week.   potassium chloride 10 MEQ tablet Commonly known as: KLOR-CON Take 1 tablet (10 mEq total) by mouth 2 (two) times daily.   spironolactone 25 MG tablet Commonly known as: ALDACTONE Take 1 tablet (25 mg total) by mouth daily.        OBJECTIVE:   Vital Signs: LMP 01/18/2018 (Approximate)   Wt Readings from Last 3 Encounters:  06/13/20 152 lb 6.4 oz (69.1 kg)  04/11/20 157 lb (71.2 kg)  02/22/20 156 lb (70.8 kg)     Exam: General: Pt appears well and is in NAD  Lungs: Clear with good BS bilat with no rales, rhonchi, or wheezes  Heart: RRR with normal S1 and S2 and no gallops; no murmurs; no rub  Abdomen: Normoactive bowel sounds, soft, nontender, without masses or organomegaly palpable  Extremities: No pretibial edema. .  Neuro: MS is good with appropriate affect, pt is alert and Ox3    DM foot exam: 08/01/2020 The skin of the feet is intact without sores or ulcerations. The pedal pulses are 2+ on right and 2+ on left. The sensation is decreased  to a screening 5.07, 10 gram monofilament on the right    DATA REVIEWED:  Lab Results  Component Value Date   HGBA1C 9.8 (A) 06/13/2020   HGBA1C 11.6 (A) 04/11/2020   HGBA1C 14.9 (H) 11/23/2019   Lab Results  Component Value Date   LDLCALC 100 (H) 06/13/2020   CREATININE 0.87 06/13/2020   Lab Results  Component Value Date   MICRALBCREAT 7 05/25/2019     Lab Results  Component Value Date   CHOL 156 06/13/2020   HDL 43 06/13/2020   LDLCALC 100 (H) 06/13/2020   TRIG 66 06/13/2020   CHOLHDL 3.6 06/13/2020         ASSESSMENT / PLAN / RECOMMENDATIONS:   1) Type 2 Diabetes Mellitus, poorly  controlled, With retinopathic complications - Most recent A1c of  9.0%. Goal A1c < 7.0 %.   -A1c down from 11.6% - I have praised the pt on continued glycemic improvement.  - Her A1c has been gradually improving, will not make any changes  MEDICATIONS: - Continue  Lantus 16 units daily  - Continue  Metformin 1000 mg Twice daily with meals - Continue Ozempic to 1 mg weekly    EDUCATION / INSTRUCTIONS: BG monitoring instructions: Patient is instructed to check her blood sugars 1 times a day. Call [facility] Endocrinology clinic if: BG  persistently < 70  I reviewed the Rule of 15 for the treatment of hypoglycemia in detail with the patient. Literature supplied.    2) Diabetic complications:  Eye: Does have known diabetic retinopathy.  Neuro/ Feet: Does not have known diabetic peripheral neuropathy. Renal: Patient does not have known baseline CKD. She is  on an ACEI/ARB at present  3. HTN  - Home BP  120/80 's  - She is not on Hydrochlorthalidone , she is not sure if her PCP wanted her to stop it or she just stopped getting refills on it.  I have advised the patient to check with her PCP on this.   F/U in 3 months  Signed electronically by: Mack Guise, MD  [facility]  [facility] [address] [place], [place] [address] Phone: [phone] FAX: [phone]   CC: Rutherford Guys, MD [address] [place] [address] Phone: [phone]  Fax: [phone]  Return to Endocrinology clinic as below: Future Appointments  Date Time Provider Shiner  08/01/2020  3:40 PM Mareesa Gathright, Melanie Crazier, MD [facility] None  08/22/2020  3:40 PM Rutherford Guys, MD [facility]

## 2020-08-01 NOTE — Telephone Encounter (Signed)
Requested Prescriptions  Pending Prescriptions Disp Refills  . atorvastatin (LIPITOR) 20 MG tablet [Pharmacy Med Name: atorvastatin 20 mg tablet] 30 tablet 5    Sig: Take 1 tablet (20 mg total) by mouth daily.     Cardiovascular:  Antilipid - Statins Failed - 08/01/2020  1:07 PM      Failed - LDL in normal range and within 360 days    LDL Chol Calc (NIH)  Date Value Ref Range Status  06/13/2020 100 (H) 0 - 99 mg/dL Final         Passed - Total Cholesterol in normal range and within 360 days    Cholesterol, Total  Date Value Ref Range Status  06/13/2020 156 100 - 199 mg/dL Final         Passed - HDL in normal range and within 360 days    HDL  Date Value Ref Range Status  06/13/2020 43 >39 mg/dL Final         Passed - Triglycerides in normal range and within 360 days    Triglycerides  Date Value Ref Range Status  06/13/2020 66 0 - 149 mg/dL Final         Passed - Patient is not pregnant      Passed - Valid encounter within last 12 months    Recent Outpatient Visits          1 month ago Screening-pulmonary TB   Primary Care at [facility], Sandria Bales, MD   5 months ago Essential hypertension   [facility] at [facility], Meda Coffee, MD   8 months ago Type 2 diabetes mellitus with other specified complication, with long-term current use of insulin [facility])   [facility] at [facility], Meda Coffee, MD   1 year ago Type 2 diabetes mellitus with other specified complication, with long-term current use of insulin [facility])   [facility] at [facility], Meda Coffee, MD   1 year ago Type 2 diabetes mellitus with other specified complication, with long-term current use of insulin [facility])   [facility] at [facility], Meda Coffee, MD      Future Appointments            In 3 weeks Myles Lipps, MD Primary Care at [place], [facility]           . lisinopril (ZESTRIL) 40 MG tablet [Pharmacy Med Name: lisinopril 40 mg tablet] 30 tablet 5    Sig: Take 1 tablet (40 mg total) by mouth daily.      Cardiovascular:  ACE Inhibitors Failed - 08/01/2020  1:07 PM      Failed - Last BP in normal range    BP Readings from Last 1 Encounters:  06/13/20 (!) 157/101         Passed - Cr in normal range and within 180 days    Creatinine, Ser  Date Value Ref Range Status  06/13/2020 0.87 0.57 - 1.00 mg/dL Final   Creatinine, Urine  Date Value Ref Range Status  12/11/2010 28.8 mg/dL Final         Passed - K in normal range and within 180 days    Potassium  Date Value Ref Range Status  06/13/2020 4.1 3.5 - 5.2 mmol/L Final         Passed - Patient is not pregnant      Passed - Valid encounter within last 6 months    Recent Outpatient Visits  1 month ago Screening-pulmonary TB   Primary Care at [facility], Sandria Bales, MD   5 months ago Essential hypertension   [facility] at [facility], Meda Coffee, MD   8 months ago Type 2 diabetes mellitus with other specified complication, with long-term current use of insulin [facility])   [facility] at [facility], Meda Coffee, MD   1 year ago Type 2 diabetes mellitus with other specified complication, with long-term current use of insulin [facility])   [facility] at [facility], Meda Coffee, MD   1 year ago Type 2 diabetes mellitus with other specified complication, with long-term current use of insulin [facility])   [facility] at [facility], Meda Coffee, MD      Future Appointments            In 3 weeks Myles Lipps, MD Primary Care at [place], [facility]           . spironolactone (ALDACTONE) 25 MG tablet [Pharmacy Med Name: spironolactone 25 mg tablet] 30 tablet 5    Sig: Take 1 tablet (25 mg total) by mouth daily.     Cardiovascular: Diuretics - Aldosterone Antagonist Failed - 08/01/2020  1:07 PM      Failed - Last BP in normal range    BP Readings from Last 1 Encounters:  06/13/20 (!) 157/101         Passed - Cr in normal range and within 360 days    Creatinine, Ser  Date Value Ref Range Status  06/13/2020 0.87 0.57 - 1.00  mg/dL Final   Creatinine, Urine  Date Value Ref Range Status  12/11/2010 28.8 mg/dL Final         Passed - K in normal range and within 360 days    Potassium  Date Value Ref Range Status  06/13/2020 4.1 3.5 - 5.2 mmol/L Final         Passed - Na in normal range and within 360 days    Sodium  Date Value Ref Range Status  06/13/2020 140 134 - 144 mmol/L Final         Passed - Valid encounter within last 6 months    Recent Outpatient Visits          1 month ago Screening-pulmonary TB   Primary Care at [facility], Sandria Bales, MD   5 months ago Essential hypertension   [facility] at [facility], Meda Coffee, MD   8 months ago Type 2 diabetes mellitus with other specified complication, with long-term current use of insulin [facility])   [facility] at [facility], Meda Coffee, MD   1 year ago Type 2 diabetes mellitus with other specified complication, with long-term current use of insulin [facility])   [facility] at [facility], Meda Coffee, MD   1 year ago Type 2 diabetes mellitus with other specified complication, with long-term current use of insulin [facility])   [facility] at [facility], Meda Coffee, MD      Future Appointments            In 3 weeks Myles Lipps, MD Primary Care at [facility], [facility]           . metoprolol succinate (TOPROL-XL) 100 MG 24 hr tablet [Pharmacy Med Name: metoprolol succinate ER 100 mg tablet,extended release 24 hr] 30 tablet 5    Sig: Take 1 tablet (100 mg total) by mouth daily. Take with or immediately following a meal.     Cardiovascular:  Beta  Blockers Failed - 08/01/2020  1:07 PM      Failed - Last BP in normal range    BP Readings from Last 1 Encounters:  06/13/20 (!) 157/101         Passed - Last Heart Rate in normal range    Pulse Readings from Last 1 Encounters:  06/13/20 83         Passed - Valid encounter within last 6 months    Recent Outpatient Visits          1 month ago Screening-pulmonary TB   Primary Care at [facility], Sandria Bales, MD   5 months ago Essential hypertension   [facility] at [facility], Meda Coffee, MD   8 months ago Type 2 diabetes mellitus with other specified complication, with long-term current use of insulin [facility])   [facility] at [facility], Meda Coffee, MD   1 year ago Type 2 diabetes mellitus with other specified complication, with long-term current use of insulin [facility])   [facility] at [facility], Meda Coffee, MD   1 year ago Type 2 diabetes mellitus with other specified complication, with long-term current use of insulin [facility])   [facility] at [facility], Meda Coffee, MD      Future Appointments            In 3 weeks Myles Lipps, MD Primary Care at [place], [facility]

## 2020-08-01 NOTE — Patient Instructions (Signed)
-   Keep up the Good Work !! - Continue Lantus 16 units daily  - Continue  Metformin 1000 mg Twice daily with meals - Continue  Ozempic to 1 mg weekly         - HOW TO TREAT LOW BLOOD SUGARS (Blood sugar LESS THAN 70 MG/DL)  Please follow the RULE OF 15 for the treatment of hypoglycemia treatment (when your (blood sugars are less than 70 mg/dL)    STEP 1: Take 15 grams of carbohydrates when your blood sugar is low, which includes:   3-4 GLUCOSE TABS  OR  3-4 OZ OF JUICE OR REGULAR SODA OR  ONE TUBE OF GLUCOSE GEL     STEP 2: RECHECK blood sugar in 15 MINUTES STEP 3: If your blood sugar is still low at the 15 minute recheck --> then, go back to STEP 1 and treat AGAIN with another 15 grams of carbohydrates.

## 2020-08-13 ENCOUNTER — Other Ambulatory Visit: Payer: Self-pay

## 2020-08-13 ENCOUNTER — Telehealth: Payer: Self-pay | Admitting: Internal Medicine

## 2020-08-13 MED ORDER — OZEMPIC (1 MG/DOSE) 2 MG/1.5ML ~~LOC~~ SOPN: 1.0000 mg | PEN_INJECTOR | SUBCUTANEOUS | 1 refills | Status: DC

## 2020-08-13 NOTE — Telephone Encounter (Signed)
Medication Refill Request  Did you call your pharmacy and request this refill first? Yes  . If patient has not contacted pharmacy first, instruct them to do so for future refills.  . Remind them that contacting the pharmacy for their refill is the quickest method to get the refill.  . Refill policy also stated that it will take anywhere between 24-72 hours to receive the refill.    Name of medication? OZEMPIC 1 MG  Is this a 90 day supply?  NO   Name and location of pharmacy? [facility] [address], [place]

## 2020-08-13 NOTE — Telephone Encounter (Signed)
Sent to pharmacy 

## 2020-08-15 ENCOUNTER — Telehealth: Payer: Self-pay | Admitting: Family Medicine

## 2020-08-15 NOTE — Telephone Encounter (Signed)
08/15/2020 - PATIENT HAD A 6 MONTH FOLLOW-UP SCHEDULED WITH DR. Darcel Bayley FOR Friday (08/22/2020). WE HAD TO CANCEL DUE TO DR. Darcel Bayley LEAVING OUR PRACTICE. I TRIED TO CALL AND GET HER SCHEDULED WITH A NEW PROVIDER BUT I HAD TO LEAVE A VOICE MAIL TO RETURN MY CALL. MBC

## 2020-08-22 ENCOUNTER — Ambulatory Visit: Payer: 59 | Admitting: Family Medicine

## 2020-09-15 ENCOUNTER — Encounter: Payer: 59 | Admitting: Family Medicine

## 2020-10-23 ENCOUNTER — Other Ambulatory Visit: Payer: Self-pay | Admitting: Internal Medicine

## 2020-10-23 MED ORDER — OZEMPIC (1 MG/DOSE) 2 MG/1.5ML ~~LOC~~ SOPN: 1.0000 mg | PEN_INJECTOR | SUBCUTANEOUS | 1 refills | Status: DC

## 2020-11-10 ENCOUNTER — Other Ambulatory Visit: Payer: Self-pay

## 2020-11-10 ENCOUNTER — Encounter: Payer: Self-pay | Admitting: Registered Nurse

## 2020-11-10 ENCOUNTER — Ambulatory Visit (INDEPENDENT_AMBULATORY_CARE_PROVIDER_SITE_OTHER): Payer: 59 | Admitting: Registered Nurse

## 2020-11-10 VITALS — BP 131/87 | HR 91 | Temp 98.0°F | Resp 17 | Ht 63.0 in | Wt 152.8 lb

## 2020-11-10 DIAGNOSIS — E1169 Type 2 diabetes mellitus with other specified complication: Secondary | ICD-10-CM

## 2020-11-10 DIAGNOSIS — I10 Essential (primary) hypertension: Secondary | ICD-10-CM

## 2020-11-10 DIAGNOSIS — M545 Low back pain, unspecified: Secondary | ICD-10-CM

## 2020-11-10 DIAGNOSIS — M791 Myalgia, unspecified site: Secondary | ICD-10-CM

## 2020-11-10 DIAGNOSIS — G8929 Other chronic pain: Secondary | ICD-10-CM

## 2020-11-10 DIAGNOSIS — M79602 Pain in left arm: Secondary | ICD-10-CM

## 2020-11-10 DIAGNOSIS — Z794 Long term (current) use of insulin: Secondary | ICD-10-CM

## 2020-11-10 MED ORDER — METHOCARBAMOL 500 MG PO TABS: 500.0000 mg | ORAL_TABLET | Freq: Every evening | ORAL | 0 refills | Status: DC | PRN

## 2020-11-10 NOTE — Progress Notes (Signed)
Established Patient Office Visit  Subjective:  Patient ID: Yolanda Herrera, female    DOB: 11/16/1970  Age: 50 y.o. MRN: [id number]  CC:  Chief Complaint  Patient presents with  . Establish Care    Pt here to establish care from Dr Pamella Pert, pt reports some arm pain in her Rt arm post COVID shot in January 2021.     HPI Yolanda Herrera presents for visit to est care.  Needs meds refilled  Requesting lab work  Has arm pain - onset last January after covid vacc dose #2. Only in upper left arm. Both other doses (first dose and booster) were in R arm, no ongoing issues. Feels muscular. No pain in joint line. No radiation towards neck  Does note R side lower back pain. Pain with bending down or lifting. Fortunately does not do much of this at work. No bony pain, no urinary or bowel symptoms. No radiation into lower extremities.  Otherwise feeling well. Here today with her son.  Past Medical History:  Diagnosis Date  . Diabetes mellitus without complication ([place])   . Hyperlipidemia   . Hypertension     Past Surgical History:  Procedure Laterality Date  . CESAREAN SECTION    . UTERINE FIBROID SURGERY      Family History  Problem Relation Age of Onset  . Diabetes Mother   . Diabetes Father   . Kidney disease Father   . Hypertension Father   . Hypertension Sister   . Stroke Sister     Social History   Socioeconomic History  . Marital status: Married    Spouse name: Not on file  . Number of children: 1  . Years of education: some college   . Highest education level: Not on file  Occupational History  . Occupation: Ship broker     Comment: Warehouse manager  Tobacco Use  . Smoking status: Never Smoker  . Smokeless tobacco: Never Used  Vaping Use  . Vaping Use: Never used  Substance and Sexual Activity  . Alcohol use: No  . Drug use: Never  . Sexual activity: Not Currently  Other Topics Concern  . Not on file  Social History Narrative   Pt is from [place].  Moved here in 2001. 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    Social Determinants of Health   Financial Resource Strain: Not on file  Food Insecurity: Not on file  Transportation Needs: Not on file  Physical Activity: Not on file  Stress: Not on file  Social Connections: Not on file  Intimate Partner Violence: Not on file    Outpatient Medications Prior to Visit  Medication Sig Dispense Refill  . ACCU-CHEK GUIDE test strip USE UP TO THREE TIMES DAILY    . amLODipine (NORVASC) 10 MG tablet Take 1 tablet (10 mg total) by mouth daily. 30 tablet 5  . atorvastatin (LIPITOR) 20 MG tablet Take 1 tablet (20 mg total) by mouth daily. 30 tablet 5  . blood glucose meter kit and supplies per insurance preference. Use up to three times daily as directed. E1.9, Z79.4 Please dispense lancets and glucose test stripes to match glucometer, 100 each 1 each 11  . insulin glargine (LANTUS SOLOSTAR) 100 UNIT/ML Solostar Pen Inject 16 Units into the skin daily. 15 mL 11  . Insulin Pen Needle 32G X 4 MM MISC 1 Device by Does not apply route daily. 50 each 11  . Insulin Syringes, Disposable, U-100  0.5 ML MISC Use twice with a day with NPH. Dx E11.65, Z79.4 100 each 3  . lisinopril (ZESTRIL) 40 MG tablet Take 1 tablet (40 mg total) by mouth daily. 30 tablet 5  . metformin (FORTAMET) 1000 MG (OSM) 24 hr tablet Take 1 tablet (1,000 mg total) by mouth 2 (two) times daily with a meal. 180 tablet 3  . metoprolol succinate (TOPROL-XL) 100 MG 24 hr tablet Take 1 tablet (100 mg total) by mouth daily. Take with or immediately following a meal. 30 tablet 5  . omeprazole (PRILOSEC) 20 MG capsule Take 1 capsule (20 mg total) by mouth 2 (two) times daily before a meal. 180 capsule 1  . potassium chloride (KLOR-CON) 10 MEQ tablet Take 1 tablet (10 mEq total) by mouth 2 (two) times daily. 180 tablet 0  . Semaglutide, 1 MG/DOSE, (OZEMPIC, 1 MG/DOSE,) 2 MG/1.5ML SOPN Inject 1 mg into the skin once a week.  4.5 mL 1  . chlorthalidone (HYGROTON) 25 MG tablet Take 1 tablet (25 mg total) by mouth daily. 30 tablet 0  . spironolactone (ALDACTONE) 25 MG tablet Take 1 tablet (25 mg total) by mouth daily. 30 tablet 5   No facility-administered medications prior to visit.    No Known Allergies  ROS Review of Systems  Constitutional: Negative.   HENT: Negative.   Eyes: Negative.   Respiratory: Negative.   Cardiovascular: Negative.   Gastrointestinal: Negative.   Genitourinary: Negative.   Musculoskeletal: Negative.   Skin: Negative.   Neurological: Negative.   Psychiatric/Behavioral: Negative.   All other systems reviewed and are negative.     Objective:    Physical Exam Vitals and nursing note reviewed.  Constitutional:      General: She is not in acute distress.    Appearance: Normal appearance. She is normal weight. She is not ill-appearing, toxic-appearing or diaphoretic.  Cardiovascular:     Rate and Rhythm: Normal rate and regular rhythm.     Heart sounds: Normal heart sounds. No murmur heard. No friction rub. No gallop.   Pulmonary:     Effort: Pulmonary effort is normal. No respiratory distress.     Breath sounds: Normal breath sounds. No stridor. No wheezing, rhonchi or rales.  Chest:     Chest wall: No tenderness.  Musculoskeletal:        General: Tenderness (L deltoid) present. No swelling, deformity or signs of injury. Normal range of motion.     Right lower leg: No edema.     Left lower leg: No edema.  Skin:    General: Skin is warm and dry.     Findings: Lesion present.  Neurological:     General: No focal deficit present.     Mental Status: She is alert and oriented to person, place, and time. Mental status is at baseline.  Psychiatric:        Mood and Affect: Mood normal.        Behavior: Behavior normal.        Thought Content: Thought content normal.        Judgment: Judgment normal.     BP 131/87   Pulse 91   Temp 98 F (36.7 C) (Temporal)   Resp  17   Ht 5' 3"  (1.6 m)   Wt 152 lb 12.8 oz (69.3 kg)   LMP 01/18/2018 (Approximate)   SpO2 99%   BMI 27.07 kg/m  Wt Readings from Last 3 Encounters:  11/10/20 152 lb 12.8 oz (69.3 kg)  08/01/20  152 lb (68.9 kg)  06/13/20 152 lb 6.4 oz (69.1 kg)     There are no preventive care reminders to display for this patient.  There are no preventive care reminders to display for this patient.  Lab Results  Component Value Date   TSH 0.751 06/13/2020   Lab Results  Component Value Date   WBC 7.0 06/13/2020   HGB 13.7 06/13/2020   HCT 42.5 06/13/2020   MCV 78 (L) 06/13/2020   PLT 255 06/13/2020   Lab Results  Component Value Date   NA 140 06/13/2020   K 4.1 06/13/2020   CO2 25 06/13/2020   GLUCOSE 150 (H) 06/13/2020   BUN 8 06/13/2020   CREATININE 0.87 06/13/2020   BILITOT 0.3 06/13/2020   ALKPHOS 99 06/13/2020   AST 10 06/13/2020   ALT 14 06/13/2020   PROT 7.4 06/13/2020   ALBUMIN 4.7 06/13/2020   CALCIUM 10.0 06/13/2020   Lab Results  Component Value Date   CHOL 156 06/13/2020   Lab Results  Component Value Date   HDL 43 06/13/2020   Lab Results  Component Value Date   LDLCALC 100 (H) 06/13/2020   Lab Results  Component Value Date   TRIG 66 06/13/2020   Lab Results  Component Value Date   CHOLHDL 3.6 06/13/2020   Lab Results  Component Value Date   HGBA1C 9.0 (A) 08/01/2020      Assessment & Plan:   Problem List Items Addressed This Visit      Cardiovascular and Mediastinum   Essential hypertension   Relevant Orders   CBC With Differential   Comprehensive metabolic panel    Other Visit Diagnoses    Muscle pain    -  Primary   Relevant Orders   CK   Chronic right-sided low back pain without sciatica       Relevant Medications   methocarbamol (ROBAXIN) 500 MG tablet   Type 2 diabetes mellitus with other specified complication, with long-term current use of insulin (HCC)       Relevant Orders   CBC With Differential   Comprehensive  metabolic panel   Hemoglobin A1c   Lipid panel   Left arm pain       Relevant Orders   Ambulatory referral to Orthopedic Surgery      Meds ordered this encounter  Medications  . methocarbamol (ROBAXIN) 500 MG tablet    Sig: Take 1 tablet (500 mg total) by mouth at bedtime as needed for muscle spasms.    Dispense:  30 tablet    Refill:  0    Order Specific Question:   Supervising Provider    Answer:   Carlota Raspberry, JEFFREY R [[id number]]    Follow-up: No follow-ups on file.   PLAN  Histories reviewed, updated as warranted  Methocarbamol for lower back pain and arm pain  Unclear what's causing her arm pain so long after injection. Will refer to ortho  Labs collected. Will follow up with the patient as warranted.  Patient encouraged to call clinic with any questions, comments, or concerns.  Maximiano Coss, NP

## 2020-11-10 NOTE — Patient Instructions (Signed)
° ° ° °  If you have lab work done today you will be contacted with your lab results within the next 2 weeks.  If you have not heard from us then please contact us. The fastest way to get your results is to register for My Chart. ° ° °IF you received an x-ray today, you will receive an invoice from [facility]. Please contact [facility] at [phone] with questions or concerns regarding your invoice.  ° °IF you received labwork today, you will receive an invoice from [facility]. Please contact [facility] at [phone] with questions or concerns regarding your invoice.  ° °Our billing staff will not be able to assist you with questions regarding bills from these companies. ° °You will be contacted with the lab results as soon as they are available. The fastest way to get your results is to activate your My Chart account. Instructions are located on the last page of this paperwork. If you have not heard from us regarding the results in 2 weeks, please contact this office. °  ° ° ° °

## 2020-11-11 LAB — COMPREHENSIVE METABOLIC PANEL
ALT: 11 IU/L (ref 0–32)
AST: 11 IU/L (ref 0–40)
Albumin/Globulin Ratio: 1.4 (ref 1.2–2.2)
Albumin: 4.4 g/dL (ref 3.8–4.8)
Alkaline Phosphatase: 93 IU/L (ref 44–121)
BUN/Creatinine Ratio: 11 (ref 9–23)
BUN: 10 mg/dL (ref 6–24)
Bilirubin Total: 0.4 mg/dL (ref 0.0–1.2)
CO2: 25 mmol/L (ref 20–29)
Calcium: 10 mg/dL (ref 8.7–10.2)
Chloride: 101 mmol/L (ref 96–106)
Creatinine, Ser: 0.88 mg/dL (ref 0.57–1.00)
GFR calc Af Amer: 89 mL/min/{1.73_m2} (ref >59)
GFR calc non Af Amer: 77 mL/min/{1.73_m2} (ref >59)
Globulin, Total: 3.2 g/dL (ref 1.5–4.5)
Glucose: 134 mg/dL — ABNORMAL HIGH (ref 65–99)
Potassium: 3.7 mmol/L (ref 3.5–5.2)
Sodium: 138 mmol/L (ref 134–144)
Total Protein: 7.6 g/dL (ref 6.0–8.5)

## 2020-11-11 LAB — CBC WITH DIFFERENTIAL
Basophils Absolute: 0 10*3/uL (ref 0.0–0.2)
Basos: 1 %
EOS (ABSOLUTE): 0.1 10*3/uL (ref 0.0–0.4)
Eos: 1 %
Hematocrit: 42.8 % (ref 34.0–46.6)
Hemoglobin: 14.4 g/dL (ref 11.1–15.9)
Immature Grans (Abs): 0 10*3/uL (ref 0.0–0.1)
Immature Granulocytes: 0 %
Lymphocytes Absolute: 3.1 10*3/uL (ref 0.7–3.1)
Lymphs: 44 %
MCH: 25.4 pg — ABNORMAL LOW (ref 26.6–33.0)
MCHC: 33.6 g/dL (ref 31.5–35.7)
MCV: 76 fL — ABNORMAL LOW (ref 79–97)
Monocytes Absolute: 0.6 10*3/uL (ref 0.1–0.9)
Monocytes: 8 %
Neutrophils Absolute: 3.3 10*3/uL (ref 1.4–7.0)
Neutrophils: 46 %
RBC: 5.66 x10E6/uL — ABNORMAL HIGH (ref 3.77–5.28)
RDW: 14.5 % (ref 11.7–15.4)
WBC: 7 10*3/uL (ref 3.4–10.8)

## 2020-11-11 LAB — LIPID PANEL
Chol/HDL Ratio: 4.1 ratio (ref 0.0–4.4)
Cholesterol, Total: 162 mg/dL (ref 100–199)
HDL: 40 mg/dL (ref >39)
LDL Chol Calc (NIH): 107 mg/dL — ABNORMAL HIGH (ref 0–99)
Triglycerides: 77 mg/dL (ref 0–149)
VLDL Cholesterol Cal: 15 mg/dL (ref 5–40)

## 2020-11-11 LAB — HEMOGLOBIN A1C
Est. average glucose Bld gHb Est-mCnc: 223 mg/dL
Hgb A1c MFr Bld: 9.4 % — ABNORMAL HIGH (ref 4.8–5.6)

## 2020-11-11 LAB — CK: Total CK: 62 U/L (ref 32–182)

## 2020-11-21 ENCOUNTER — Ambulatory Visit: Payer: 59 | Admitting: Orthopaedic Surgery

## 2020-12-02 ENCOUNTER — Ambulatory Visit: Payer: 59 | Admitting: Orthopaedic Surgery

## 2020-12-05 ENCOUNTER — Ambulatory Visit: Payer: 59 | Admitting: Internal Medicine

## 2021-01-08 ENCOUNTER — Telehealth: Payer: Self-pay | Admitting: Internal Medicine

## 2021-01-08 MED ORDER — METFORMIN HCL ER (OSM) 1000 MG PO TB24: 1000.0000 mg | ORAL_TABLET | Freq: Two times a day (BID) | ORAL | 1 refills | Status: DC

## 2021-01-08 NOTE — Telephone Encounter (Signed)
Ok to change

## 2021-01-08 NOTE — Telephone Encounter (Signed)
Metformin refill that we sent in will cost patient over $4700 so they were asking to switch pt to 500 mg extended release 2 tablets twice daily (this will cost her nothing).    They ask that a new 90 day prescription be sent in: [facility] - [place], [place] - [address] [facility] Phone:  [phone]  Fax:  [phone]

## 2021-01-08 NOTE — Addendum Note (Signed)
Addended by: Tawnya Crook on: 01/08/2021 04:35 PM   Modules accepted: Orders

## 2021-01-09 MED ORDER — METFORMIN HCL ER 500 MG PO TB24: 1000.0000 mg | ORAL_TABLET | Freq: Every day | ORAL | 0 refills | Status: DC

## 2021-01-09 NOTE — Addendum Note (Signed)
Addended by: Tawnya Crook on: 01/09/2021 10:11 AM   Modules accepted: Orders

## 2021-01-09 NOTE — Telephone Encounter (Signed)
Ok to change

## 2021-01-09 NOTE — Telephone Encounter (Signed)
Rx have been sent 

## 2021-01-12 ENCOUNTER — Other Ambulatory Visit: Payer: Self-pay | Admitting: Internal Medicine

## 2021-01-14 ENCOUNTER — Other Ambulatory Visit: Payer: Self-pay

## 2021-01-14 ENCOUNTER — Ambulatory Visit (INDEPENDENT_AMBULATORY_CARE_PROVIDER_SITE_OTHER): Payer: 59 | Admitting: Internal Medicine

## 2021-01-14 ENCOUNTER — Encounter: Payer: Self-pay | Admitting: Internal Medicine

## 2021-01-14 VITALS — BP 130/84 | HR 93 | Ht 63.0 in | Wt 153.0 lb

## 2021-01-14 DIAGNOSIS — Z794 Long term (current) use of insulin: Secondary | ICD-10-CM | POA: Diagnosis not present

## 2021-01-14 DIAGNOSIS — E1165 Type 2 diabetes mellitus with hyperglycemia: Secondary | ICD-10-CM | POA: Diagnosis not present

## 2021-01-14 LAB — BASIC METABOLIC PANEL
BUN: 9 mg/dL (ref 6–23)
CO2: 28 mEq/L (ref 19–32)
Calcium: 9.7 mg/dL (ref 8.4–10.5)
Chloride: 102 mEq/L (ref 96–112)
Creatinine, Ser: 0.83 mg/dL (ref 0.40–1.20)
GFR: 82.11 mL/min (ref >60.00)
Glucose, Bld: 170 mg/dL — ABNORMAL HIGH (ref 70–99)
Potassium: 3.9 mEq/L (ref 3.5–5.1)
Sodium: 137 mEq/L (ref 135–145)

## 2021-01-14 LAB — POCT GLYCOSYLATED HEMOGLOBIN (HGB A1C): Hemoglobin A1C: 9.4 % — AB (ref 4.0–5.6)

## 2021-01-14 LAB — POCT GLUCOSE (DEVICE FOR HOME USE): POC Glucose: 222 mg/dl — AB (ref 70–99)

## 2021-01-14 LAB — MICROALBUMIN / CREATININE URINE RATIO
Creatinine,U: 117.6 mg/dL
Microalb Creat Ratio: 1.9 mg/g (ref 0.0–30.0)
Microalb, Ur: 2.2 mg/dL — ABNORMAL HIGH (ref 0.0–1.9)

## 2021-01-14 MED ORDER — INSULIN PEN NEEDLE 32G X 4 MM MISC: 1.0000 | Freq: Every day | 11 refills | Status: DC

## 2021-01-14 MED ORDER — LANTUS SOLOSTAR 100 UNIT/ML ~~LOC~~ SOPN: 20.0000 [IU] | PEN_INJECTOR | Freq: Every day | SUBCUTANEOUS | 6 refills | Status: DC

## 2021-01-14 NOTE — Patient Instructions (Signed)
-   Increase  Lantus to 20 units daily  - Continue  Metformin 1000 mg Twice daily with meals - Continue  Ozempic to 1 mg weekly         - HOW TO TREAT LOW BLOOD SUGARS (Blood sugar LESS THAN 70 MG/DL)  Please follow the RULE OF 15 for the treatment of hypoglycemia treatment (when your (blood sugars are less than 70 mg/dL)    STEP 1: Take 15 grams of carbohydrates when your blood sugar is low, which includes:   3-4 GLUCOSE TABS  OR  3-4 OZ OF JUICE OR REGULAR SODA OR  ONE TUBE OF GLUCOSE GEL     STEP 2: RECHECK blood sugar in 15 MINUTES STEP 3: If your blood sugar is still low at the 15 minute recheck --> then, go back to STEP 1 and treat AGAIN with another 15 grams of carbohydrates.

## 2021-01-14 NOTE — Progress Notes (Signed)
Name: Yolanda Herrera  Age/ Sex: 50 y.o., female   MRN/ DOB: [id number], 03/16/1971     PCP: Lezlie Lye, Meda Coffee, MD   Reason for Endocrinology Evaluation: Type 2 Diabetes Mellitus  Initial Endocrine Consultative Visit: 01/11/2020    PATIENT IDENTIFIER: Yolanda Herrera is a 50 y.o. female with a past medical history of DM and dyslipidemia. The patient has followed with Endocrinology clinic since 01/11/2020 for consultative assistance with management of her diabetes.  DIABETIC HISTORY:  Yolanda Herrera was diagnosed with DM in 2011.  She has been on Metformin since her diagnosis, as well as glimepiride.  [place] was cost prohibitive.  Ozempic was started in 12/2019.  Her hemoglobin A1c has ranged from 9.2% in 2019, peaking at 14.9% in 2021.   On her initial visit to our clinic, her A1c was 14.9%   , she was on Metformin, glimepiride, Ozempic, and NPH.  We stopped  glimepiride and NPH, we increase Metformin, continue to Ozempic and started Lantus.  Works at [employer] at [employer] living   7 Am to 3 pm   SUBJECTIVE:   During the last visit (08/01/2020): A1c 9.0 %, we continue Lantus and Metformin and increase Ozempic     Today (01/14/2021): Yolanda Herrera is here for follow-up on diabetes management. She checks her blood sugars twice a week  . The patient has not had hypoglycemic episodes since the last clinic visit   Weight has been stable  Denies nausea or diarrhea     HOME DIABETES REGIMEN:  Lantus 16 units daily  Metformin 1000 mg Twice daily with meals Ozempic 1 mg weekly     Statin: Yes ACE-I/ARB: Yes   METER DOWNLOAD SUMMARY: Did not bring  BG > 160 mg/dL    DIABETIC COMPLICATIONS: Microvascular complications:   Retinopathy ? Right,   Denies: neuropathy   Last eye exam: Completed 12/2019  Macrovascular complications:    Denies: CAD, PVD, CVA  HISTORY:  Past Medical History:  Past Medical History:  Diagnosis Date  . Diabetes  mellitus without complication (HCC)   . Hyperlipidemia   . Hypertension    Past Surgical History:  Past Surgical History:  Procedure Laterality Date  . CESAREAN SECTION    . UTERINE FIBROID SURGERY      Social History:  reports that she has never smoked. She has never used smokeless tobacco. She reports that she does not drink alcohol and does not use drugs. Family History:  Family History  Problem Relation Age of Onset  . Diabetes Mother   . Diabetes Father   . Kidney disease Father   . Hypertension Father   . Hypertension Sister   . Stroke Sister      HOME MEDICATIONS: Allergies as of 01/14/2021   No Known Allergies     Medication List       Accurate as of January 14, 2021  3:26 PM. If you have any questions, ask your nurse or doctor.        Accu-Chek Guide test strip Generic drug: glucose blood USE UP TO THREE TIMES DAILY   amLODipine 10 MG tablet Commonly known as: NORVASC Take 1 tablet (10 mg total) by mouth daily.   atorvastatin 20 MG tablet Commonly known as: LIPITOR Take 1 tablet (20 mg total) by mouth daily.   blood glucose meter kit and supplies per insurance preference. Use up to three times daily as directed. E1.9, Z79.4 Please dispense lancets and glucose test stripes to match glucometer, 100  each   chlorthalidone 25 MG tablet Commonly known as: HYGROTON Take 1 tablet (25 mg total) by mouth daily.   Insulin Pen Needle 32G X 4 MM Misc 1 Device by Does not apply route daily.   Insulin Syringes (Disposable) U-100 0.5 ML Misc Use twice with a day with NPH. Dx E11.65, Z79.4   Lantus SoloStar 100 UNIT/ML Solostar Pen Generic drug: insulin glargine Inject 16 Units into the skin daily.   lisinopril 40 MG tablet Commonly known as: ZESTRIL Take 1 tablet (40 mg total) by mouth daily.   metFORMIN 500 MG 24 hr tablet Commonly known as: Glucophage XR Take 2 tablets (1,000 mg total) by mouth daily with breakfast. What changed:   how much to  take  when to take this   methocarbamol 500 MG tablet Commonly known as: Robaxin Take 1 tablet (500 mg total) by mouth at bedtime as needed for muscle spasms.   metoprolol succinate 100 MG 24 hr tablet Commonly known as: TOPROL-XL Take 1 tablet (100 mg total) by mouth daily. Take with or immediately following a meal.   omeprazole 20 MG capsule Commonly known as: PRILOSEC Take 1 capsule (20 mg total) by mouth 2 (two) times daily before a meal.   Ozempic (1 MG/DOSE) 4 MG/3ML Sopn Generic drug: Semaglutide (1 MG/DOSE) Inject 1 mg into the skin once a week.   potassium chloride 10 MEQ tablet Commonly known as: KLOR-CON Take 1 tablet (10 mEq total) by mouth 2 (two) times daily.   spironolactone 25 MG tablet Commonly known as: ALDACTONE Take 1 tablet (25 mg total) by mouth daily.        OBJECTIVE:   Vital Signs: BP 130/84   Pulse 93   Ht 5\' 3"  (1.6 m)   Wt 153 lb (69.4 kg)   LMP 01/18/2018 (Approximate)   SpO2 97%   BMI 27.10 kg/m   Wt Readings from Last 3 Encounters:  01/14/21 153 lb (69.4 kg)  11/10/20 152 lb 12.8 oz (69.3 kg)  08/01/20 152 lb (68.9 kg)     Exam: General: Pt appears well and is in NAD  Lungs: Clear with good BS bilat   Heart: RRR  Abdomen: Normoactive bowel sounds, soft, nontender, without masses or organomegaly palpable  Extremities: No pretibial edema. .  Neuro: MS is good with appropriate affect, pt is alert and Ox3    DM foot exam: 08/01/2020 The skin of the feet is intact without sores or ulcerations. The pedal pulses are 2+ on right and 2+ on left. The sensation is decreased  to a screening 5.07, 10 gram monofilament on the right    DATA REVIEWED:  Lab Results  Component Value Date   HGBA1C 9.4 (H) 11/10/2020   HGBA1C 9.0 (A) 08/01/2020   HGBA1C 9.8 (A) 06/13/2020   Lab Results  Component Value Date   LDLCALC 107 (H) 11/10/2020   CREATININE 0.88 11/10/2020    Lab Results  Component Value Date   CHOL 162 11/10/2020    HDL 40 11/10/2020   LDLCALC 107 (H) 11/10/2020   TRIG 77 11/10/2020   CHOLHDL 4.1 11/10/2020       Results for Yolanda Herrera, Yolanda Herrera (MRN [id number]) as of 01/15/2021 12:50  Ref. Range 01/14/2021 16:00  Sodium Latest Ref Range: 135 - 145 mEq/L 137  Potassium Latest Ref Range: 3.5 - 5.1 mEq/L 3.9  Chloride Latest Ref Range: 96 - 112 mEq/L 102  CO2 Latest Ref Range: 19 - 32 mEq/L 28  Glucose Latest Ref Range:  70 - 99 mg/dL 573 (H)  BUN Latest Ref Range: 6 - 23 mg/dL 9  Creatinine Latest Ref Range: 0.40 - 1.20 mg/dL 2.20  Calcium Latest Ref Range: 8.4 - 10.5 mg/dL 9.7  GFR Latest Ref Range: >60.00 mL/min 82.11  MICROALB/CREAT RATIO Latest Ref Range: 0.0 - 30.0 mg/g 1.9  Creatinine,U Latest Units: mg/dL 254.2  Microalb, Ur Latest Ref Range: 0.0 - 1.9 mg/dL 2.2 (H)    ASSESSMENT / PLAN / RECOMMENDATIONS:   1) Type 2 Diabetes Mellitus, poorly controlled, With retinopathic complications - Most recent A1c of  9.4%. Goal A1c < 7.0 %.     - Pt continues with hyperglycemia  - We discussed add on therapy with SGLT-2 inhibitors but these have been cost prohibitive, pioglitazone but she is not keep on weight gain as also discussed the possibility of prandial insulin .  - Will check C-peptide , in the meantime, will increase lantus as below  - BMP , and MA/Cr ratio normal   MEDICATIONS: - Increase   Lantus 20 units daily  - Continue  Metformin 1000 mg Twice daily with meals - Continue Ozempic 1 mg weekly    EDUCATION / INSTRUCTIONS:  BG monitoring instructions: Patient is instructed to check her blood sugars 1 times a day.  Call [facility] if: BG persistently < 70  . I reviewed the Rule of 15 for the treatment of hypoglycemia in detail with the patient. Literature supplied.    2) Diabetic complications:   Eye: Does have known diabetic retinopathy.   Neuro/ Feet: Does not have known diabetic peripheral neuropathy.  Renal: Patient does not have known baseline CKD. She  is  on an ACEI/ARB at present      F/U in 3 months  Signed electronically by: Lyndle Herrlich, MD  [facility]  [facility] [address] [place], [place] [address] Phone: [phone] FAX: [phone]   CC: Lezlie Lye, Meda Coffee, MD [address] [place] [address] Phone: [phone]  Fax: [phone]  Return to Endocrinology clinic as below: No future appointments.

## 2021-01-15 ENCOUNTER — Encounter: Payer: Self-pay | Admitting: Internal Medicine

## 2021-01-15 LAB — C-PEPTIDE: C-Peptide: 2.94 ng/mL (ref 0.80–3.85)

## 2021-03-12 ENCOUNTER — Ambulatory Visit: Payer: 59 | Admitting: Orthopaedic Surgery

## 2021-03-18 ENCOUNTER — Other Ambulatory Visit: Payer: Self-pay

## 2021-03-18 ENCOUNTER — Ambulatory Visit (INDEPENDENT_AMBULATORY_CARE_PROVIDER_SITE_OTHER): Payer: 59

## 2021-03-18 ENCOUNTER — Ambulatory Visit (INDEPENDENT_AMBULATORY_CARE_PROVIDER_SITE_OTHER): Payer: 59 | Admitting: Orthopaedic Surgery

## 2021-03-18 DIAGNOSIS — M25512 Pain in left shoulder: Secondary | ICD-10-CM

## 2021-03-18 DIAGNOSIS — G8929 Other chronic pain: Secondary | ICD-10-CM | POA: Diagnosis not present

## 2021-03-18 NOTE — Progress Notes (Signed)
Office Visit Note   Patient: Yolanda Herrera           Date of Birth: 10-14-1971           MRN: [id number] Visit Date: 03/18/2021              Requested by: Janeece Agee, NP [address] [place],  [place] [address] PCP: Lezlie Lye, Meda Coffee, MD   Assessment & Plan: Visit Diagnoses:  1. Chronic left shoulder pain     Plan: Impression is chronic left shoulder pain presenting chronic adhesive capsulitis.  This could be partially related to the COVID-vaccine and also to poorly controlled diabetes.  I have recommended intra-articular steroid injection followed by home exercises or when she feels improvement.  She will follow-up in 6 weeks for reevaluation.  I urged her to see her primary care doctor ASAP to get diabetes under better control.  Follow-Up Instructions: Return in about 6 weeks (around 04/29/2021).   Orders:  Orders Placed This Encounter  Procedures  . XR Shoulder Left   No orders of the defined types were placed in this encounter.     Procedures: No procedures performed   Clinical Data: No additional findings.   Subjective: Chief Complaint  Patient presents with  . Left Shoulder - Pain    Yolanda Herrera is a 50 year old female here for chronic left shoulder pain that started about a year ago sometime after her second COVID shot.  The pain is worse at night.  Occasional numbness tingling and burning.  Denies any neck pain.  She is a poorly controlled diabetic with recent A1c of 9.4 2 months ago.  It was over 10 about a year ago.  She works as a Advertising copywriter.  She has been using Tylenol and ibuprofen without significant relief.  She is right-hand dominant.   Review of Systems  Constitutional: Negative.   HENT: Negative.   Eyes: Negative.   Respiratory: Negative.   Cardiovascular: Negative.   Endocrine: Negative.   Musculoskeletal: Negative.   Neurological: Negative.   Hematological: Negative.   Psychiatric/Behavioral: Negative.   All other systems  reviewed and are negative.    Objective: Vital Signs: LMP 01/18/2018 (Approximate)   Physical Exam Vitals and nursing note reviewed.  Constitutional:      Appearance: She is well-developed.  HENT:     Head: Normocephalic and atraumatic.  Pulmonary:     Effort: Pulmonary effort is normal.  Abdominal:     Palpations: Abdomen is soft.  Musculoskeletal:     Cervical back: Neck supple.  Skin:    General: Skin is warm.     Capillary Refill: Capillary refill takes less than 2 seconds.  Neurological:     Mental Status: She is alert and oriented to person, place, and time.  Psychiatric:        Behavior: Behavior normal.        Thought Content: Thought content normal.        Judgment: Judgment normal.     Ortho Exam Left shoulder shows moderate limitation with abduction external rotation internal rotation for flexion.  Strength is grossly intact to manual muscle testing. Specialty Comments:  No specialty comments available.  Imaging: XR Shoulder Left  Result Date: 03/18/2021 No acute or structural abnormalities.  Slight arthritic changes of the AC joint.    PMFS History: Patient Active Problem List   Diagnosis Date Noted  . Essential hypertension 04/11/2020  . Type 2 diabetes mellitus with hyperglycemia,  with long-term current use of insulin (HCC) 01/11/2020  . Type 2 diabetes mellitus with retinopathy of right eye, with long-term current use of insulin (HCC) 01/11/2020  . Dyslipidemia 01/11/2020  . Type 2 diabetes mellitus without complication, with long-term current use of insulin (HCC) 02/01/2018  . Resistant hypertension 02/01/2018  . Hyperlipidemia 02/01/2018   Past Medical History:  Diagnosis Date  . Diabetes mellitus without complication (HCC)   . Hyperlipidemia   . Hypertension     Family History  Problem Relation Age of Onset  . Diabetes Mother   . Diabetes Father   . Kidney disease Father   . Hypertension Father   . Hypertension Sister   . Stroke  Sister     Past Surgical History:  Procedure Laterality Date  . CESAREAN SECTION    . UTERINE FIBROID SURGERY     Social History   Occupational History  . 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 Smoking status: Never Smoker  . Smokeless tobacco: Never Used  Vaping Use  . Vaping Use: Never used  Substance and Sexual Activity  . Alcohol use: No  . Drug use: Never  . Sexual activity: Not Currently

## 2021-03-24 ENCOUNTER — Ambulatory Visit: Payer: 59 | Admitting: Family Medicine

## 2021-03-25 ENCOUNTER — Ambulatory Visit (INDEPENDENT_AMBULATORY_CARE_PROVIDER_SITE_OTHER): Payer: 59 | Admitting: Family Medicine

## 2021-03-25 ENCOUNTER — Ambulatory Visit: Payer: Self-pay

## 2021-03-25 ENCOUNTER — Other Ambulatory Visit: Payer: Self-pay

## 2021-03-25 DIAGNOSIS — G8929 Other chronic pain: Secondary | ICD-10-CM

## 2021-03-25 DIAGNOSIS — M25512 Pain in left shoulder: Secondary | ICD-10-CM

## 2021-03-25 NOTE — Progress Notes (Signed)
Subjective: Patient is here for ultrasound-guided intra-articular left glenohumeral injection.  Pain for a year since getting a covid vaccine.  Objective:  Pain and slightly decreased ROM with overhead reach.  Procedure: Ultrasound guided injection is preferred based studies that show increased duration, increased effect, greater accuracy, decreased procedural pain, increased response rate, and decreased cost with ultrasound guided versus blind injection.   Verbal informed consent obtained.  Time-out conducted.  Noted no overlying erythema, induration, or other signs of local infection. Ultrasound-guided left glenohumeral injection: After sterile prep with Betadine, injected 4 cc 0.25% bupivocaine without epinephrine and 6 mg betamethasone using a 22-gauge spinal needle, passing the needle from posterior approach into the glenohumeral joint.  Injectate seen filling joint capsule.

## 2021-04-11 ENCOUNTER — Other Ambulatory Visit: Payer: Self-pay | Admitting: Internal Medicine

## 2021-04-14 ENCOUNTER — Ambulatory Visit: Payer: 59 | Admitting: Orthopaedic Surgery

## 2021-04-15 ENCOUNTER — Ambulatory Visit (INDEPENDENT_AMBULATORY_CARE_PROVIDER_SITE_OTHER): Payer: 59 | Admitting: Orthopaedic Surgery

## 2021-04-15 ENCOUNTER — Encounter: Payer: Self-pay | Admitting: Orthopaedic Surgery

## 2021-04-15 DIAGNOSIS — M7502 Adhesive capsulitis of left shoulder: Secondary | ICD-10-CM | POA: Diagnosis not present

## 2021-04-15 NOTE — Progress Notes (Signed)
Office Visit Note   Patient: Yolanda Herrera           Date of Birth: 07/14/1971           MRN: [id number] Visit Date: 04/15/2021              Requested by: Lezlie Lye, Meda Coffee, MD [address] [place],  [place] [address] PCP: Lezlie Lye, Meda Coffee, MD   Assessment & Plan: Visit Diagnoses:  1. Adhesive capsulitis of left shoulder     Plan: Impression is left shoulder adhesive capsulitis 3 weeks status post glenohumeral steroid injection with significant but incomplete symptom improvement. We recommended giving her shoulder more time and continuing with home therapy exercises and diabetes management. If after 6-8 weeks she has not noticed improvement or her symptoms worsen we will consider ordering an MRI of her shoulder but my suspicion is low for structural abnormalities.  Follow-Up Instructions: As needed if symptoms worsen or fail to improve.   Orders:  No orders of the defined types were placed in this encounter.  No orders of the defined types were placed in this encounter.     Procedures: No procedures performed   Clinical Data: No additional findings.   Subjective: Chief Complaint  Patient presents with   Left Shoulder - Pain    HPI Yolanda Herrera is a 50 y.o. female following up for left shoulder adhesive capsulitis. She underwent a left glenohumeral injection with Dr. Prince Rome on 03/25/21. She reports significant symptom improvement following the injection. She is no longer having shoulder pain at night and her motion is improving. She continues to have lateral shoulder pain with reaching overhead and some discomfort in her elbow. She has been doing home exercises with the [employer]. She is not doing any outpatient physical therapy. She reports that her Diabetes is better controlled.   Review of Systems ROS was reviewed and negative unless as stated in HPI.   Objective: Vital Signs: LMP 01/18/2018 (Approximate)   Physical Exam  Ortho  Exam Left shoulder exam: Active forward flexion to 170 degrees, internal and external rotation within normal limits. She gives way with empty can and external rotation strength testing due to pain.   Specialty Comments:  No specialty comments available.  Imaging: No new imaging.   PMFS History: Patient Active Problem List   Diagnosis Date Noted   Essential hypertension 04/11/2020   Type 2 diabetes mellitus with hyperglycemia, with long-term current use of insulin (HCC) 01/11/2020   Type 2 diabetes mellitus with retinopathy of right eye, with long-term current use of insulin (HCC) 01/11/2020   Dyslipidemia 01/11/2020   Type 2 diabetes mellitus without complication, with long-term current use of insulin (HCC) 02/01/2018   Resistant hypertension 02/01/2018   Hyperlipidemia 02/01/2018   Past Medical History:  Diagnosis Date   Diabetes mellitus without complication (HCC)    Hyperlipidemia    Hypertension     Family History  Problem Relation Age of Onset   Diabetes Mother    Diabetes Father    Kidney disease Father    Hypertension Father    Hypertension Sister    Stroke Sister     Past Surgical History:  Procedure Laterality Date   CESAREAN SECTION     UTERINE FIBROID SURGERY     Social History   Occupational History   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

## 2021-05-18 ENCOUNTER — Ambulatory Visit (INDEPENDENT_AMBULATORY_CARE_PROVIDER_SITE_OTHER): Payer: 59 | Admitting: Internal Medicine

## 2021-05-18 ENCOUNTER — Other Ambulatory Visit: Payer: Self-pay

## 2021-05-18 ENCOUNTER — Encounter: Payer: Self-pay | Admitting: Internal Medicine

## 2021-05-18 VITALS — BP 160/90 | HR 94 | Ht 63.0 in | Wt 155.0 lb

## 2021-05-18 DIAGNOSIS — Z794 Long term (current) use of insulin: Secondary | ICD-10-CM

## 2021-05-18 DIAGNOSIS — E269 Hyperaldosteronism, unspecified: Secondary | ICD-10-CM | POA: Diagnosis not present

## 2021-05-18 DIAGNOSIS — I10 Essential (primary) hypertension: Secondary | ICD-10-CM | POA: Diagnosis not present

## 2021-05-18 DIAGNOSIS — E1165 Type 2 diabetes mellitus with hyperglycemia: Secondary | ICD-10-CM | POA: Diagnosis not present

## 2021-05-18 LAB — POCT GLYCOSYLATED HEMOGLOBIN (HGB A1C): Hemoglobin A1C: 14.1 % — AB (ref 4.0–5.6)

## 2021-05-18 MED ORDER — OZEMPIC (2 MG/DOSE) 8 MG/3ML ~~LOC~~ SOPN: 2.0000 mg | PEN_INJECTOR | SUBCUTANEOUS | 3 refills | Status: DC

## 2021-05-18 MED ORDER — LANTUS SOLOSTAR 100 UNIT/ML ~~LOC~~ SOPN: 24.0000 [IU] | PEN_INJECTOR | Freq: Every day | SUBCUTANEOUS | 6 refills | Status: DC

## 2021-05-18 MED ORDER — LISINOPRIL 40 MG PO TABS: 40.0000 mg | ORAL_TABLET | Freq: Every day | ORAL | 1 refills | Status: DC

## 2021-05-18 MED ORDER — METFORMIN HCL ER 500 MG PO TB24: 1000.0000 mg | ORAL_TABLET | Freq: Two times a day (BID) | ORAL | 3 refills | Status: DC

## 2021-05-18 MED ORDER — HYDROCHLOROTHIAZIDE 25 MG PO TABS: 25.0000 mg | ORAL_TABLET | Freq: Every day | ORAL | 3 refills | Status: DC

## 2021-05-18 NOTE — Progress Notes (Signed)
Name: Yolanda Herrera  Age/ Sex: 50 y.o., female   MRN/ DOB: [id number], [birth year]     PCP: Jacelyn Pi, Lilia Argue, MD   Reason for Endocrinology Evaluation: Type 2 Diabetes Mellitus  Initial Endocrine Consultative Visit: 01/11/2020    PATIENT IDENTIFIER: Yolanda Herrera is a 50 y.o. female with a past medical history of DM and dyslipidemia. The patient has followed with Endocrinology clinic since 01/11/2020 for consultative assistance with management of her diabetes.  DIABETIC HISTORY:  Yolanda Herrera was diagnosed with DM in 2011.  She has been on Metformin since her diagnosis, as well as glimepiride.  Vania Rea was cost prohibitive.  Ozempic was started in 12/2019.  Her hemoglobin A1c has ranged from 9.2% in 2019, peaking at 14.9% in 2021.   On her initial visit to our clinic, her A1c was 14.9%   , she was on Metformin, glimepiride, Ozempic, and NPH.  We stopped  glimepiride and NPH, we increase Metformin, continue to Ozempic and started Lantus.   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 She checks her blood sugars twice a week  . The patient has not had hypoglycemic episodes since the last clinic visit     Weight has been stable  Denies nausea or diarrhea     HOME DIABETES REGIMEN:  Lantus 20 units daily  Metformin 500 mg Twice daily with meals Ozempic 1 mg weekly     Statin: Yes ACE-I/ARB: Yes   METER DOWNLOAD SUMMARY: Did not bring  BG > 160 mg/dL    DIABETIC COMPLICATIONS: Microvascular complications:  Retinopathy ? Right,  Denies: neuropathy  Last eye exam: Completed 12/2019   Macrovascular complications:    Denies: CAD, PVD, CVA  HISTORY:  Past Medical History:   Past Medical History:  Diagnosis Date   Diabetes mellitus without complication ([place])    Hyperlipidemia    Hypertension    Past Surgical History:  Past Surgical History:  Procedure Laterality Date   CESAREAN SECTION     UTERINE FIBROID SURGERY     Social History:  reports that she has never smoked. She has never used smokeless tobacco. She reports that she does not drink alcohol and does not use drugs. Family History:  Family History  Problem Relation Age of Onset   Diabetes Mother    Diabetes Father    Kidney disease Father    Hypertension Father    Hypertension Sister    Stroke Sister      HOME MEDICATIONS: Allergies as of 05/18/2021   No Known Allergies      Medication List        Accurate as of May 18, 2021 12:54 PM. If you have any questions, ask your nurse or doctor.          Accu-Chek Guide test strip Generic drug: glucose blood USE UP TO THREE TIMES DAILY   amLODipine 10 MG tablet Commonly known as: NORVASC Take 1 tablet (10 mg total) by mouth daily.   atorvastatin 20 MG tablet Commonly known as: LIPITOR Take 1 tablet (20 mg total) by mouth daily.   blood glucose meter kit and supplies per insurance preference. Use up to three times daily  as directed. E1.9, Z79.4 Please dispense lancets and glucose test stripes to match glucometer, 100 each   chlorthalidone 25 MG tablet Commonly known as: HYGROTON Take 1 tablet (25 mg total) by mouth daily.   Insulin Pen Needle 32G X 4 MM Misc 1 Device by Does not apply route daily.   Insulin Syringes (Disposable) U-100 0.5 ML Misc Use twice with a day with NPH. Dx E11.65, Z79.4   Lantus SoloStar 100 UNIT/ML Solostar Pen Generic drug: insulin glargine Inject 20 Units into the skin daily.   lisinopril 40 MG tablet Commonly known as: ZESTRIL Take 1 tablet (40 mg total) by mouth daily.   metFORMIN 500 MG 24 hr tablet Commonly known as: Glucophage XR Take 2 tablets (1,000 mg total) by mouth daily with  breakfast. What changed:  how much to take when to take this   methocarbamol 500 MG tablet Commonly known as: Robaxin Take 1 tablet (500 mg total) by mouth at bedtime as needed for muscle spasms.   metoprolol succinate 100 MG 24 hr tablet Commonly known as: TOPROL-XL Take 1 tablet (100 mg total) by mouth daily. Take with or immediately following a meal.   omeprazole 20 MG capsule Commonly known as: PRILOSEC Take 1 capsule (20 mg total) by mouth 2 (two) times daily before a meal.   Ozempic (1 MG/DOSE) 4 MG/3ML Sopn Generic drug: Semaglutide (1 MG/DOSE) Inject 1 mg into the skin once a week.   potassium chloride 10 MEQ tablet Commonly known as: KLOR-CON Take 1 tablet (10 mEq total) by mouth 2 (two) times daily.   spironolactone 25 MG tablet Commonly known as: ALDACTONE Take 1 tablet (25 mg total) by mouth daily.         OBJECTIVE:   Vital Signs: LMP 01/18/2018 (Approximate)   Wt Readings from Last 3 Encounters:  01/14/21 153 lb (69.4 kg)  11/10/20 152 lb 12.8 oz (69.3 kg)  08/01/20 152 lb (68.9 kg)     Exam: General: Pt appears well and is in NAD  Lungs: Clear with good BS bilat   Heart: RRR  Abdomen: Normoactive bowel sounds, soft, nontender, without masses or organomegaly palpable  Extremities: No pretibial edema. .  Neuro: MS is good with appropriate affect, pt is alert and Ox3    DM foot exam: 08/01/2020  The skin of the feet is intact without sores or ulcerations. The pedal pulses are 2+ on right and 2+ on left. The sensation is decreased  to a screening 5.07, 10 gram monofilament on the right    DATA REVIEWED:  Lab Results  Component Value Date   HGBA1C 9.4 (A) 01/14/2021   HGBA1C 9.4 (H) 11/10/2020   HGBA1C 9.0 (A) 08/01/2020   Lab Results  Component Value Date   MICROALBUR 2.2 (H) 01/14/2021   LDLCALC 107 (H) 11/10/2020   CREATININE 0.83 01/14/2021    Lab Results  Component Value Date   CHOL 162 11/10/2020   HDL 40 11/10/2020    LDLCALC 107 (H) 11/10/2020   TRIG 77 11/10/2020   CHOLHDL 4.1 11/10/2020       Results for Yolanda Herrera (MRN [id number]) as of 01/15/2021 12:50  Ref. Range 01/14/2021 16:00  Sodium Latest Ref Range: 135 - 145 mEq/L 137  Potassium Latest Ref Range: 3.5 - 5.1 mEq/L 3.9  Chloride Latest Ref Range: 96 - 112 mEq/L 102  CO2 Latest Ref Range: 19 - 32 mEq/L 28  Glucose Latest Ref Range: 70 - 99 mg/dL 170 (H)  BUN Latest Ref  Range: 6 - 23 mg/dL 9  Creatinine Latest Ref Range: 0.40 - 1.20 mg/dL 0.83  Calcium Latest Ref Range: 8.4 - 10.5 mg/dL 9.7  GFR Latest Ref Range: >60.00 mL/min 82.11  MICROALB/CREAT RATIO Latest Ref Range: 0.0 - 30.0 mg/g 1.9  Creatinine,U Latest Units: mg/dL 117.6  Microalb, Ur Latest Ref Range: 0.0 - 1.9 mg/dL 2.2 (H)    ASSESSMENT / PLAN / RECOMMENDATIONS:   1) Type 2 Diabetes Mellitus, poorly controlled, With retinopathic complications - Most recent A1c of  14.1 %. Goal A1c < 7.0 %.   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 -In the past we discussed SGLT2 inhibitors but were cost prohibitive, we also discussed in the past pioglitazone but she was reluctant to start due to weight gain. -We will make the following changes  MEDICATIONS: - Increase   Lantus 2 4 units daily  -Increase metformin 500 mg , 2 tabs twice daily with meals -Increase Ozempic to 2 mg weekly    EDUCATION / INSTRUCTIONS: BG monitoring instructions: Patient is instructed to check her blood sugars 1 times a day. Call [facility] if: BG persistently < 70  I reviewed the Rule of 15 for the treatment of hypoglycemia in detail with the patient. Literature supplied.    2) Diabetic complications:  Eye: Does have known diabetic retinopathy.  Neuro/ Feet: Does not have known diabetic peripheral neuropathy. Renal:  Patient does not have known baseline CKD. She is  on an ACEI/ARB at present   3) HTN:  -The patient was uncontrolled BP despite being on amlodipine, lisinopril, metoprolol, and spironolactone -In review of records she has been noted to have a suppressed renin<0.167, aldosterone of 17.5 with an elevated Aldo/renin ratio> 104.8, this was done in 2019 through cardiology, it appears that she was started on spironolactone at the time -I am going to add hydrochlorothiazide -I am also going to proceed with abdominal scan for adrenal adenoma -We briefly discussed alternative options such as continuing medical management with spironolactone versus adrenalectomy all depending on further testing  Start hydrochlorothiazide 25 mg daily Continue lisinopril 40 mg daily Continue metoprolol 100 mg daily Continue amlodipine 10 mg daily Continue spironolactone 25 mg daily  F/U in 3 months  Signed electronically by: Mack Guise, MD  [facility]  [facility] [address] [place], [place] [address] Phone: [phone] FAX: [phone]   CC: Jacelyn Pi, Lilia Argue, MD [address] [place] [address] Phone: [phone]  Fax: [phone]  Return to Endocrinology clinic as below: Future Appointments  Date Time Provider [place]  05/18/2021  3:20 PM Marili Vader, Melanie Crazier, MD [id number] None

## 2021-05-18 NOTE — Patient Instructions (Signed)
-   Increase  Lantus to 24 units daily  - Increase Metformin 500 mg in the morning and 1000 mg with Supper for 1 week, then increase to 1000 mg with Breakfast and Supper - Increase Ozempic to 2 mg weekly        - HOW TO TREAT LOW BLOOD SUGARS (Blood sugar LESS THAN 70 MG/DL) Please follow the RULE OF 15 for the treatment of hypoglycemia treatment (when your (blood sugars are less than 70 mg/dL)   STEP 1: Take 15 grams of carbohydrates when your blood sugar is low, which includes:  3-4 GLUCOSE TABS  OR 3-4 OZ OF JUICE OR REGULAR SODA OR ONE TUBE OF GLUCOSE GEL    STEP 2: RECHECK blood sugar in 15 MINUTES STEP 3: If your blood sugar is still low at the 15 minute recheck --> then, go back to STEP 1 and treat AGAIN with another 15 grams of carbohydrates.

## 2021-06-05 ENCOUNTER — Ambulatory Visit
Admission: RE | Admit: 2021-06-05 | Discharge: 2021-06-05 | Disposition: A | Discharge status: Home / Self Care | Payer: 59 | Source: Ambulatory Visit | Attending: Internal Medicine | Admitting: Internal Medicine

## 2021-06-05 ENCOUNTER — Other Ambulatory Visit: Payer: Self-pay

## 2021-06-05 DIAGNOSIS — E269 Hyperaldosteronism, unspecified: Secondary | ICD-10-CM

## 2021-06-05 MED ORDER — IOPAMIDOL (ISOVUE-300) INJECTION 61%
100.0000 mL | Freq: Once | INTRAVENOUS | Status: AC | PRN
  Administered 2021-06-05: 100 mL via INTRAVENOUS

## 2021-06-17 ENCOUNTER — Telehealth: Payer: Self-pay | Admitting: Internal Medicine

## 2021-06-17 DIAGNOSIS — K839 Disease of biliary tract, unspecified: Secondary | ICD-10-CM

## 2021-06-17 DIAGNOSIS — K862 Cyst of pancreas: Secondary | ICD-10-CM

## 2021-06-17 DIAGNOSIS — E269 Hyperaldosteronism, unspecified: Secondary | ICD-10-CM

## 2021-06-17 MED ORDER — SPIRONOLACTONE 50 MG PO TABS: 50.0000 mg | ORAL_TABLET | Freq: Every day | ORAL | 1 refills | Status: DC

## 2021-06-17 NOTE — Telephone Encounter (Signed)
I have discussed the CT scan results with the patient on 06/17/2021   We discussed that there is no evidence of adrenal tumors, we discussed proceeding with confirmation of primary hyper Aldo.  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      CT scan also revealed pancreatic cyst as well as biliary tree dilatation, this is beyond the scope of endocrinology, will refer to GI   Patient in agreement of this plan   Medication Increase spironolactone to 50 mg daily   Abby Raelyn Mora, MD  [facility] End[facility]in[facility]  [facility] [address] [place], [place] [address] Phone: [phone] FAX: [phone]

## 2021-06-26 ENCOUNTER — Encounter: Payer: Self-pay | Admitting: Gastroenterology

## 2021-07-07 ENCOUNTER — Telehealth: Payer: Self-pay | Admitting: Internal Medicine

## 2021-07-07 NOTE — Telephone Encounter (Signed)
Patient called to advise that the pharmacies are out of Ozempic (2MG ) and she is wondering if Ozempic (1MG ) can be called in for her or another comparable medication and dosage. Patients pharmacy is on back # [phone]

## 2021-07-08 MED ORDER — OZEMPIC (1 MG/DOSE) 4 MG/3ML ~~LOC~~ SOPN: 2.0000 mg | PEN_INJECTOR | SUBCUTANEOUS | 6 refills | Status: DC

## 2021-07-08 NOTE — Telephone Encounter (Signed)
Patient notified and will pick up new script today .

## 2021-07-16 ENCOUNTER — Ambulatory Visit (INDEPENDENT_AMBULATORY_CARE_PROVIDER_SITE_OTHER): Payer: 59 | Admitting: Gastroenterology

## 2021-07-16 ENCOUNTER — Encounter: Payer: Self-pay | Admitting: Gastroenterology

## 2021-07-16 VITALS — BP 148/90 | HR 103 | Ht 63.0 in | Wt 155.0 lb

## 2021-07-16 DIAGNOSIS — K5900 Constipation, unspecified: Secondary | ICD-10-CM | POA: Diagnosis not present

## 2021-07-16 DIAGNOSIS — Z1212 Encounter for screening for malignant neoplasm of rectum: Secondary | ICD-10-CM

## 2021-07-16 DIAGNOSIS — K219 Gastro-esophageal reflux disease without esophagitis: Secondary | ICD-10-CM | POA: Diagnosis not present

## 2021-07-16 DIAGNOSIS — K862 Cyst of pancreas: Secondary | ICD-10-CM | POA: Diagnosis not present

## 2021-07-16 DIAGNOSIS — Z1211 Encounter for screening for malignant neoplasm of colon: Secondary | ICD-10-CM

## 2021-07-16 MED ORDER — PLENVU 140 G PO SOLR: ORAL | 0 refills | Status: DC

## 2021-07-16 NOTE — Patient Instructions (Addendum)
If you are age 51 or older, your body mass index should be between 23-30. Your Body mass index is 27.46 kg/m. If this is out of the aforementioned range listed, please consider follow up with your Primary Care Provider.  If you are age 63 or younger, your body mass index should be between 19-25. Your Body mass index is 27.46 kg/m. If this is out of the aformentioned range listed, please consider follow up with your Primary Care Provider.   Start Metamucil daily.  Take Senna as needed if no bowel movement within three dayss.  Continue Omeprazole.  You have been scheduled for a colonoscopy. Please follow written instructions given to you at your visit today.  Please pick up your prep supplies at the pharmacy within the next 1-3 days. If you use inhalers (even only as needed), please bring them with you on the day of your procedure.   The [facility] providers would like to encourage you to use [facility] to communicate with providers for non-urgent requests or questions.  Due to long hold times on the telephone, sending your provider a message by [facility] may be a faster and more efficient way to get a response.  Please allow 48 business hours for a response.  Please remember that this is for non-urgent requests.   Due to recent changes in healthcare laws, you may see the results of your imaging and laboratory studies on MyChart before your provider has had a chance to review them.  We understand that in some cases there may be results that are confusing or concerning to you. Not all laboratory results come back in the same time frame and the provider may be waiting for multiple results in order to interpret others.  Please give [place] 48 hours in order for your provider to thoroughly review all the results before contacting the office for clarification of your results.   Scott E. Tomasa Rand, MD

## 2021-07-16 NOTE — Progress Notes (Signed)
HPI : Yolanda Herrera is a very pleasant 50 year old female with history of uncontrolled diabetes who was referred to [place] by her endocrinologist, Dr. Vivia Ewing because of an incidentally noted pancreatic cyst.  The patient underwent an abdominal CT to evaluate for an adrenal adenoma due to uncontrolled hypertension despite 4 antihypertensive medicines.  The CT did not reveal any adrenal abnormalities, but did incidentally show a 7 mm cyst in the body of the pancreas and a mildly dilated common bile duct.  The patient has no lab evidence of biliary obstruction and no symptoms suggestive of a symptomatic cyst or biliary obstruction.  She does have problems with chronic abdominal pain in association with chronic constipation.  On average she has a bowel movement every other week.  She has significant straining with bowel movements.  Her stools are typically hard initially and then loose and poorly formed.  She has had problems with constipation for many years.  Her abdominal pain is usually worsened preceding bowel movements and improves after bowel movements.  She has previously taken Miralax daily which did not help her bowel frequency or reduce straining. She has not tried other laxatives. She has chronic heartburn, typically after meals.  She used to have symptoms on a daily basis.  She started taking omeprazole about 1-2 years ago which completely controlled her heartburn.  Now she takes omeprazole about once every other week and this controls her heartburn well.  No dysphagia.  No weight loss.     She denies any family history of GI malignancy. No history of alcohol or tobacco use.  No history or family history of pancreatitis.    Past Medical History:  Diagnosis Date   Diabetes mellitus without complication ([place])    Hyperlipidemia    Hypertension      Past Surgical History:  Procedure Laterality Date   CESAREAN SECTION     UTERINE FIBROID SURGERY     Family History  Problem  Relation Age of Onset   Diabetes Mother    Diabetes Father    Kidney disease Father    Hypertension Father    Hypertension Sister    Stroke Sister    Social History   Tobacco Use   Smoking status: Never   Smokeless tobacco: Never  Vaping Use   Vaping Use: Never used  Substance Use Topics   Alcohol use: No   Drug use: Never   Current Outpatient Medications  Medication Sig Dispense Refill   ACCU-CHEK GUIDE test strip USE UP TO THREE TIMES DAILY     amLODipine (NORVASC) 10 MG tablet Take 1 tablet (10 mg total) by mouth daily. 30 tablet 5   atorvastatin (LIPITOR) 20 MG tablet Take 1 tablet (20 mg total) by mouth daily. 30 tablet 5   blood glucose meter kit and supplies per insurance preference. Use up to three times daily as directed. E1.9, Z79.4 Please dispense lancets and glucose test stripes to match glucometer, 100 each 1 each 11   hydrochlorothiazide (HYDRODIURIL) 25 MG tablet Take 1 tablet (25 mg total) by mouth daily. 90 tablet 3   insulin glargine (LANTUS SOLOSTAR) 100 UNIT/ML Solostar Pen Inject 24 Units into the skin daily. 15 mL 6   Insulin Pen Needle 32G X 4 MM MISC 1 Device by Does not apply route daily. 50 each 11   Insulin Syringes, Disposable, U-100 0.5 ML MISC Use twice with a day with NPH. Dx E11.65, Z79.4 100 each 3   lisinopril (ZESTRIL) 40 MG tablet  Take 1 tablet (40 mg total) by mouth daily. 90 tablet 1   metFORMIN (GLUCOPHAGE XR) 500 MG 24 hr tablet Take 2 tablets (1,000 mg total) by mouth in the morning and at bedtime. 360 tablet 3   metoprolol succinate (TOPROL-XL) 100 MG 24 hr tablet Take 1 tablet (100 mg total) by mouth daily. Take with or immediately following a meal. 30 tablet 5   omeprazole (PRILOSEC) 20 MG capsule Take 1 capsule (20 mg total) by mouth 2 (two) times daily before a meal. 180 capsule 1   potassium chloride (KLOR-CON) 10 MEQ tablet Take 1 tablet (10 mEq total) by mouth 2 (two) times daily. 180 tablet 0   Semaglutide, 1 MG/DOSE, (OZEMPIC, 1  MG/DOSE,) 4 MG/3ML SOPN Inject 2 mg into the skin once a week. 6 mL 6   spironolactone (ALDACTONE) 50 MG tablet Take 1 tablet (50 mg total) by mouth daily. 90 tablet 1   No current facility-administered medications for this visit.   No Known Allergies   Review of Systems: All systems reviewed and negative except where noted in HPI.    No results found.  Physical Exam: BP (!) 148/90   Pulse (!) 103   Ht 5' 3"  (1.6 m)   Wt 155 lb (70.3 kg)   LMP 01/18/2018 (Approximate)   BMI 27.46 kg/m  Constitutional: Pleasant,well-developed, African female in no acute distress. HEENT: Normocephalic and atraumatic. Conjunctivae are normal. No scleral icterus. Cardiovascular: Normal rate, regular rhythm.  Pulmonary/chest: Effort normal and breath sounds normal. No wheezing, rales or rhonchi. Abdominal: Soft, nondistended, nontender. Bowel sounds active throughout. There are no masses palpable. No hepatomegaly. Extremities: no edema Neurological: Alert and oriented to person place and time. Skin: Skin is warm and dry. No rashes noted. Psychiatric: Normal mood and affect. Behavior is normal.  CBC    Component Value Date/Time   WBC 7.0 11/10/2020 1736   WBC 8.0 12/30/2013 1420   RBC 5.66 (H) 11/10/2020 1736   RBC 5.87 (H) 12/30/2013 1420   HGB 14.4 11/10/2020 1736   HCT 42.8 11/10/2020 1736   PLT 255 06/13/2020 1501   MCV 76 (L) 11/10/2020 1736   MCH 25.4 (L) 11/10/2020 1736   MCH 21.5 (L) 12/30/2013 1420   MCHC 33.6 11/10/2020 1736   MCHC 32.7 12/30/2013 1420   RDW 14.5 11/10/2020 1736   LYMPHSABS 3.1 11/10/2020 1736   MONOABS 0.2 12/30/2013 1420   EOSABS 0.1 11/10/2020 1736   BASOSABS 0.0 11/10/2020 1736    CMP     Component Value Date/Time   NA 137 01/14/2021 1600   NA 138 11/10/2020 1736   K 3.9 01/14/2021 1600   CL 102 01/14/2021 1600   CO2 28 01/14/2021 1600   GLUCOSE 170 (H) 01/14/2021 1600   BUN 9 01/14/2021 1600   BUN 10 11/10/2020 1736   CREATININE 0.83  01/14/2021 1600   CALCIUM 9.7 01/14/2021 1600   PROT 7.6 11/10/2020 1736   ALBUMIN 4.4 11/10/2020 1736   AST 11 11/10/2020 1736   ALT 11 11/10/2020 1736   ALKPHOS 93 11/10/2020 1736   BILITOT 0.4 11/10/2020 1736   GFRNONAA 77 11/10/2020 1736   GFRAA 89 11/10/2020 1736     ASSESSMENT AND PLAN: 50 year old female with poorly controlled diabetes found to have incidental small pancreatic cyst and mildly prominent common bile duct on CT scan.  No symptoms that would be attributable to her cyst and no suggestion of biliary obstruction based on labs and symptoms.  She has chronic  constipation and related abdominal discomfort as well as chronic GERD which is well controlled with omeprazole.  We discussed the significance of pancreatic cysts and the potential for malignant transformation.  I reassured her that given the small size of the cyst there is little concern for malignancy.  I recommended we perform an MRCP in 6 to 12 months to reassess the biliary dilation and pancreatic cyst.  If persistent biliary dilation, then consider endoscopic ultrasound to further evaluate.  For her constipation, I recommended she start taking Metamucil every single day, and increase to 2-3 times a day if she is not noticing any change after a week or 2.  She should take senna 8.6 mg as needed when she has not had a bowel movement in 3 days.  For her GERD, I recommended she discontinue taking the omeprazole as she has been, every 1 to 2 weeks.  This seems to work well for her.  She is due for her initial average rescreening colonoscopy.  We will schedule her for that today.  Pancreatic cyst, likely side branch IPMN -MRCP in 6 to 12 months  Common bile duct prominence, asymptomatic, normal liver enzymes - MRCP in 6 to 12 months  Constipation - Metamucil daily, increasing to 2-3 times per day if needed - Senna as needed every 3 days when no bowel movement.  GERD - Continue omeprazole as needed  Colon cancer  screening - Schedule for screening colonoscopy  The details, risks (including bleeding, perforation, infection, missed lesions, medication reactions and possible hospitalization or surgery if complications occur), benefits, and alternatives to colonoscopy with possible biopsy and possible polypectomy were discussed with the patient and she consents to proceed.    Rachard Isidro E. Candis Schatz, MD [facility]   CC: Jacelyn Pi, Lilia Argue, *

## 2021-07-28 ENCOUNTER — Ambulatory Visit (INDEPENDENT_AMBULATORY_CARE_PROVIDER_SITE_OTHER): Payer: 59 | Admitting: Registered Nurse

## 2021-07-28 ENCOUNTER — Other Ambulatory Visit: Payer: Self-pay

## 2021-07-28 ENCOUNTER — Encounter: Payer: Self-pay | Admitting: Registered Nurse

## 2021-07-28 VITALS — BP 152/81 | HR 89 | Temp 98.3°F | Resp 18 | Ht 63.0 in | Wt 153.4 lb

## 2021-07-28 DIAGNOSIS — E785 Hyperlipidemia, unspecified: Secondary | ICD-10-CM | POA: Diagnosis not present

## 2021-07-28 DIAGNOSIS — I10 Essential (primary) hypertension: Secondary | ICD-10-CM

## 2021-07-28 DIAGNOSIS — G8929 Other chronic pain: Secondary | ICD-10-CM

## 2021-07-28 DIAGNOSIS — M25512 Pain in left shoulder: Secondary | ICD-10-CM

## 2021-07-28 DIAGNOSIS — K219 Gastro-esophageal reflux disease without esophagitis: Secondary | ICD-10-CM

## 2021-07-28 DIAGNOSIS — Z8639 Personal history of other endocrine, nutritional and metabolic disease: Secondary | ICD-10-CM | POA: Diagnosis not present

## 2021-07-28 LAB — COMPREHENSIVE METABOLIC PANEL
ALT: 15 U/L (ref 0–35)
AST: 12 U/L (ref 0–37)
Albumin: 4.6 g/dL (ref 3.5–5.2)
Alkaline Phosphatase: 97 U/L (ref 39–117)
BUN: 8 mg/dL (ref 6–23)
CO2: 25 mEq/L (ref 19–32)
Calcium: 10 mg/dL (ref 8.4–10.5)
Chloride: 95 mEq/L — ABNORMAL LOW (ref 96–112)
Creatinine, Ser: 0.94 mg/dL (ref 0.40–1.20)
GFR: 70.46 mL/min (ref >60.00)
Glucose, Bld: 474 mg/dL — ABNORMAL HIGH (ref 70–99)
Potassium: 4.3 mEq/L (ref 3.5–5.1)
Sodium: 133 mEq/L — ABNORMAL LOW (ref 135–145)
Total Bilirubin: 0.4 mg/dL (ref 0.2–1.2)
Total Protein: 7.7 g/dL (ref 6.0–8.3)

## 2021-07-28 LAB — CBC WITH DIFFERENTIAL/PLATELET
Basophils Absolute: 0 10*3/uL (ref 0.0–0.1)
Basophils Relative: 0.5 % (ref 0.0–3.0)
Eosinophils Absolute: 0.1 10*3/uL (ref 0.0–0.7)
Eosinophils Relative: 0.8 % (ref 0.0–5.0)
HCT: 44.1 % (ref 36.0–46.0)
Hemoglobin: 14.1 g/dL (ref 12.0–15.0)
Lymphocytes Relative: 34.3 % (ref 12.0–46.0)
Lymphs Abs: 2.3 10*3/uL (ref 0.7–4.0)
MCHC: 31.9 g/dL (ref 30.0–36.0)
MCV: 78.1 fl (ref 78.0–100.0)
Monocytes Absolute: 0.4 10*3/uL (ref 0.1–1.0)
Monocytes Relative: 5.3 % (ref 3.0–12.0)
Neutro Abs: 4 10*3/uL (ref 1.4–7.7)
Neutrophils Relative %: 59.1 % (ref 43.0–77.0)
Platelets: 227 10*3/uL (ref 150.0–400.0)
RBC: 5.64 Mil/uL — ABNORMAL HIGH (ref 3.87–5.11)
RDW: 15.4 % (ref 11.5–15.5)
WBC: 6.8 10*3/uL (ref 4.0–10.5)

## 2021-07-28 LAB — LIPID PANEL
Cholesterol: 150 mg/dL (ref 0–200)
HDL: 38.3 mg/dL — ABNORMAL LOW (ref >39.00)
LDL Cholesterol: 80 mg/dL (ref 0–99)
NonHDL: 111.21
Total CHOL/HDL Ratio: 4
Triglycerides: 154 mg/dL — ABNORMAL HIGH (ref 0.0–149.0)
VLDL: 30.8 mg/dL (ref 0.0–40.0)

## 2021-07-28 LAB — TSH: TSH: 0.71 u[IU]/mL (ref 0.35–5.50)

## 2021-07-28 MED ORDER — OMEPRAZOLE 20 MG PO CPDR: 20.0000 mg | DELAYED_RELEASE_CAPSULE | Freq: Two times a day (BID) | ORAL | 1 refills | Status: DC

## 2021-07-28 MED ORDER — ATORVASTATIN CALCIUM 20 MG PO TABS: 20.0000 mg | ORAL_TABLET | Freq: Every day | ORAL | 0 refills | Status: DC

## 2021-07-28 MED ORDER — METOPROLOL SUCCINATE ER 100 MG PO TB24: 100.0000 mg | ORAL_TABLET | Freq: Every day | ORAL | 0 refills | Status: DC

## 2021-07-28 MED ORDER — POTASSIUM CHLORIDE ER 10 MEQ PO TBCR: 10.0000 meq | EXTENDED_RELEASE_TABLET | Freq: Two times a day (BID) | ORAL | 0 refills | Status: DC

## 2021-07-28 MED ORDER — SPIRONOLACTONE 50 MG PO TABS: 50.0000 mg | ORAL_TABLET | Freq: Every day | ORAL | 0 refills | Status: DC

## 2021-07-28 MED ORDER — AMLODIPINE BESYLATE 10 MG PO TABS: 10.0000 mg | ORAL_TABLET | Freq: Every day | ORAL | 0 refills | Status: DC

## 2021-07-28 MED ORDER — CHLORTHALIDONE 25 MG PO TABS: 25.0000 mg | ORAL_TABLET | Freq: Every day | ORAL | 0 refills | Status: DC

## 2021-07-28 MED ORDER — IRBESARTAN 150 MG PO TABS: 150.0000 mg | ORAL_TABLET | Freq: Every day | ORAL | 0 refills | Status: DC

## 2021-07-28 NOTE — Progress Notes (Signed)
FYI - pt having remarkably high sugars today in office. Asymptomatic.   Thanks,  Luan Pulling

## 2021-07-28 NOTE — Progress Notes (Signed)
Established Patient Office Visit  Subjective:  Patient ID: Yolanda Herrera, female    DOB: 02-16-1971  Age: 50 y.o. MRN: [id number]  CC:  Chief Complaint  Patient presents with   Transitions Of Care    Patient states she is here for a TOC and discuss getting some medication refills and labs.    HPI Yolanda Herrera presents for visit to est care with myself as PCP.  Had been pt at [facility] with Dr. Pamella Pert.   Histories reviewed and updated with patient.   T2dm - managed with Dr. Kelton Pillar in endo Lab Results  Component Value Date   HGBA1C 14.1 (A) 05/18/2021  Currently taking: semaglutide 36m weekly, glargine 24 units daily, metformin XR 10052mpo bid ac Consistently poor control with A1c ranging from 9s to 14s.   Hypertension: Patient Currently taking: amlodipine 1031mo qd, hctz 42m74m qd, lisinopril 40mg29mqd, spironolactone 50mg 110md Has run out of amlodipine Subtherapeutic effect. No Aes from meds.  Denies CV symptoms including: chest pain, shob, doe, headache, visual changes, fatigue, claudication, and dependent edema.   Previous readings and labs: BP Readings from Last 3 Encounters:  07/28/21 (!) 152/81  07/16/21 (!) 148/90  05/18/21 (!) 160/90   Lab Results  Component Value Date   CREATININE 0.83 01/14/2021    Hld Atorvastatin 20mg p101m Good effect, no AE Had rec'd 6 mo supply around a year ago. Has run out. Lab Results  Component Value Date   CHOL 162 11/10/2020   HDL 40 11/10/2020   LDLCALC 107 (H) 11/10/2020   TRIG 77 11/10/2020   CHOLHDL 4.1 11/10/2020   Shoulder pain Ongoing, chronic. Adhesive capsulitis Worse as day goes on Xrays unremarkable Dr Xu suggErlinda Honged MRI if pain ongoing 6-8 weeks after last visit in June. No new symptoms or aspects to pain.  Past Medical History:  Diagnosis Date   Diabetes mellitus without complication ([facility])   Magnetperlipidemia    Hypertension     Past Surgical History:  Procedure Laterality Date    CESAREAN SECTION     UTERINE FIBROID SURGERY      Family History  Problem Relation Age of Onset   Diabetes Mother    Diabetes Father    Kidney disease Father    Hypertension Father    Hypertension Sister    Stroke Sister     Social History   Socioeconomic History   Marital status: Married    Spouse name: Not on file   Number of children: 1   Years of education: some college    Highest education level: Not on file  Occupational History   Occupation: Student[employer]hip brokermment: [employer] Use   Smoking status: Never   Smokeless tobacco: Never  Vaping Use   Vaping Use: Never used  Substance and Sexual Activity   Alcohol use: No   Drug use: Never   Sexual activity: Not Currently  Other Topics Concern   Not on file  Social History Narrative   Pt is from [place]. [place] here in [place]. 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    Social Determinants of Health   Financial Resource Strain: Not on file  Food Insecurity: Not on file  Transportation Needs: Not on file  Physical Activity: Not on file  Stress: Not on file  Social Connections: Not on file  Intimate Partner Violence: Not on file    Outpatient Medications  Prior to Visit  Medication Sig Dispense Refill   ACCU-CHEK GUIDE test strip USE UP TO THREE TIMES DAILY     blood glucose meter kit and supplies per insurance preference. Use up to three times daily as directed. E1.9, Z79.4 Please dispense lancets and glucose test stripes to match glucometer, 100 each 1 each 11   insulin glargine (LANTUS SOLOSTAR) 100 UNIT/ML Solostar Pen Inject 24 Units into the skin daily. 15 mL 6   Insulin Pen Needle 32G X 4 MM MISC 1 Device by Does not apply route daily. 50 each 11   Insulin Syringes, Disposable, U-100 0.5 ML MISC Use twice with a day with NPH. Dx E11.65, Z79.4 100 each 3   metFORMIN (GLUCOPHAGE XR) 500 MG 24 hr tablet Take 2 tablets (1,000 mg total) by mouth in the morning and at bedtime. 360  tablet 3   PEG-KCl-NaCl-NaSulf-Na Asc-C (PLENVU) 140 g SOLR Use as directed. Manufacturer's coupon Universal coupon code:BIN: P2366821; GROUP: [id number]; PCN: [id number]; ID: [id number]; PAY NO MORE $50; NO prior authorization 1 each 0   Semaglutide, 1 MG/DOSE, (OZEMPIC, 1 MG/DOSE,) 4 MG/3ML SOPN Inject 2 mg into the skin once a week. 6 mL 6   amLODipine (NORVASC) 10 MG tablet Take 1 tablet (10 mg total) by mouth daily. 30 tablet 5   atorvastatin (LIPITOR) 20 MG tablet Take 1 tablet (20 mg total) by mouth daily. 30 tablet 5   hydrochlorothiazide (HYDRODIURIL) 25 MG tablet Take 1 tablet (25 mg total) by mouth daily. 90 tablet 3   lisinopril (ZESTRIL) 40 MG tablet Take 1 tablet (40 mg total) by mouth daily. 90 tablet 1   metoprolol succinate (TOPROL-XL) 100 MG 24 hr tablet Take 1 tablet (100 mg total) by mouth daily. Take with or immediately following a meal. 30 tablet 5   omeprazole (PRILOSEC) 20 MG capsule Take 1 capsule (20 mg total) by mouth 2 (two) times daily before a meal. 180 capsule 1   potassium chloride (KLOR-CON) 10 MEQ tablet Take 1 tablet (10 mEq total) by mouth 2 (two) times daily. 180 tablet 0   spironolactone (ALDACTONE) 50 MG tablet Take 1 tablet (50 mg total) by mouth daily. 90 tablet 1   No facility-administered medications prior to visit.    No Known Allergies  ROS Review of Systems  Constitutional: Negative.   HENT: Negative.    Eyes: Negative.   Respiratory: Negative.    Cardiovascular: Negative.   Gastrointestinal: Negative.   Genitourinary: Negative.   Musculoskeletal: Negative.   Skin: Negative.   Neurological: Negative.   Psychiatric/Behavioral: Negative.    All other systems reviewed and are negative.    Objective:    Physical Exam Vitals and nursing note reviewed.  Constitutional:      General: She is not in acute distress.    Appearance: Normal appearance. She is normal weight. She is not ill-appearing, toxic-appearing or diaphoretic.  Cardiovascular:      Rate and Rhythm: Normal rate and regular rhythm.     Heart sounds: Normal heart sounds. No murmur heard.   No friction rub. No gallop.  Pulmonary:     Effort: Pulmonary effort is normal. No respiratory distress.     Breath sounds: Normal breath sounds. No stridor. No wheezing, rhonchi or rales.  Chest:     Chest wall: No tenderness.  Skin:    General: Skin is warm and dry.  Neurological:     General: No focal deficit present.     Mental Status: She is  alert and oriented to person, place, and time. Mental status is at baseline.  Psychiatric:        Mood and Affect: Mood normal.        Behavior: Behavior normal.        Thought Content: Thought content normal.        Judgment: Judgment normal.    BP (!) 152/81   Pulse 89   Temp 98.3 F (36.8 C) (Temporal)   Resp 18   Ht 5' 3"  (1.6 m)   Wt 153 lb 6.4 oz (69.6 kg)   LMP 01/18/2018 (Approximate)   SpO2 98%   BMI 27.17 kg/m  Wt Readings from Last 3 Encounters:  07/28/21 153 lb 6.4 oz (69.6 kg)  07/16/21 155 lb (70.3 kg)  05/18/21 155 lb (70.3 kg)     There are no preventive care reminders to display for this patient.  There are no preventive care reminders to display for this patient.  Lab Results  Component Value Date   TSH 0.751 06/13/2020   Lab Results  Component Value Date   WBC 7.0 11/10/2020   HGB 14.4 11/10/2020   HCT 42.8 11/10/2020   MCV 76 (L) 11/10/2020   PLT 255 06/13/2020   Lab Results  Component Value Date   NA 137 01/14/2021   K 3.9 01/14/2021   CO2 28 01/14/2021   GLUCOSE 170 (H) 01/14/2021   BUN 9 01/14/2021   CREATININE 0.83 01/14/2021   BILITOT 0.4 11/10/2020   ALKPHOS 93 11/10/2020   AST 11 11/10/2020   ALT 11 11/10/2020   PROT 7.6 11/10/2020   ALBUMIN 4.4 11/10/2020   CALCIUM 9.7 01/14/2021   GFR 82.11 01/14/2021   Lab Results  Component Value Date   CHOL 162 11/10/2020   Lab Results  Component Value Date   HDL 40 11/10/2020   Lab Results  Component Value Date   LDLCALC  107 (H) 11/10/2020   Lab Results  Component Value Date   TRIG 77 11/10/2020   Lab Results  Component Value Date   CHOLHDL 4.1 11/10/2020   Lab Results  Component Value Date   HGBA1C 14.1 (A) 05/18/2021      Assessment & Plan:   Problem List Items Addressed This Visit       Cardiovascular and Mediastinum   Essential hypertension   Relevant Medications   spironolactone (ALDACTONE) 50 MG tablet   atorvastatin (LIPITOR) 20 MG tablet   metoprolol succinate (TOPROL-XL) 100 MG 24 hr tablet   amLODipine (NORVASC) 10 MG tablet   chlorthalidone (HYGROTON) 25 MG tablet   irbesartan (AVAPRO) 150 MG tablet   Other Relevant Orders   CBC with Differential/Platelet   TSH     Other   Hyperlipidemia   Relevant Medications   spironolactone (ALDACTONE) 50 MG tablet   atorvastatin (LIPITOR) 20 MG tablet   metoprolol succinate (TOPROL-XL) 100 MG 24 hr tablet   amLODipine (NORVASC) 10 MG tablet   chlorthalidone (HYGROTON) 25 MG tablet   irbesartan (AVAPRO) 150 MG tablet   Other Relevant Orders   Lipid panel   TSH   Other Visit Diagnoses     History of hypokalemia    -  Primary   Relevant Medications   potassium chloride (KLOR-CON) 10 MEQ tablet   Other Relevant Orders   CBC with Differential/Platelet   TSH   Comprehensive metabolic panel   Gastroesophageal reflux disease, unspecified whether esophagitis present       Relevant Medications   omeprazole (PRILOSEC)  20 MG capsule   Other Relevant Orders   TSH   Chronic pain in left shoulder       Relevant Orders   MR Shoulder Left Wo Contrast       Meds ordered this encounter  Medications   spironolactone (ALDACTONE) 50 MG tablet    Sig: Take 1 tablet (50 mg total) by mouth daily.    Dispense:  90 tablet    Refill:  0    Order Specific Question:   Supervising Provider    Answer:   Carlota Raspberry, JEFFREY R [[id number]]   atorvastatin (LIPITOR) 20 MG tablet    Sig: Take 1 tablet (20 mg total) by mouth daily.    Dispense:  90 tablet     Refill:  0    Order Specific Question:   Supervising Provider    Answer:   Carlota Raspberry, JEFFREY R [[id number]]   omeprazole (PRILOSEC) 20 MG capsule    Sig: Take 1 capsule (20 mg total) by mouth 2 (two) times daily before a meal.    Dispense:  180 capsule    Refill:  1    Order Specific Question:   Supervising Provider    Answer:   Carlota Raspberry, JEFFREY R [[id number]]   metoprolol succinate (TOPROL-XL) 100 MG 24 hr tablet    Sig: Take 1 tablet (100 mg total) by mouth daily. Take with or immediately following a meal.    Dispense:  90 tablet    Refill:  0    Order Specific Question:   Supervising Provider    Answer:   Carlota Raspberry, JEFFREY R [[id number]]   amLODipine (NORVASC) 10 MG tablet    Sig: Take 1 tablet (10 mg total) by mouth daily.    Dispense:  90 tablet    Refill:  0    Order Specific Question:   Supervising Provider    Answer:   Carlota Raspberry, JEFFREY R [[id number]]   potassium chloride (KLOR-CON) 10 MEQ tablet    Sig: Take 1 tablet (10 mEq total) by mouth 2 (two) times daily.    Dispense:  180 tablet    Refill:  0    Order Specific Question:   Supervising Provider    Answer:   Carlota Raspberry, JEFFREY R [[id number]]   chlorthalidone (HYGROTON) 25 MG tablet    Sig: Take 1 tablet (25 mg total) by mouth daily.    Dispense:  90 tablet    Refill:  0    Order Specific Question:   Supervising Provider    Answer:   Carlota Raspberry, JEFFREY R [[id number]]   irbesartan (AVAPRO) 150 MG tablet    Sig: Take 1 tablet (150 mg total) by mouth daily.    Dispense:  90 tablet    Refill:  0    Order Specific Question:   Supervising Provider    Answer:   Carlota Raspberry, JEFFREY R [[id number]]    Follow-up: Return in about 2 weeks (around 08/11/2021) for BP check nurse visit - then return in 3 mo for HTN with provider. Marland Kitchen   PLAN Will change HCTZ to chlorthalidone given htn. Reviewed that this will have increased control of bp but increased risk of hypokalemia. Continue potassium supplement. Will forgo lisinopril in favor of irbesartan with interest of preventing some  D9IP complications.  Refill atorvastatin as above Will order MRI for ongoing shoulder pain. Low suspicion for structural damage but suggested by orhto Dr. Erlinda Hong. Labs collected. Will follow up with the patient as warranted. Bp check nurse visit in 2-3 weeks. Visit  with provider in 3 mo Patient encouraged to call clinic with any questions, comments, or concerns.  I spent 45 minutes with patient in chart review, addressing acute and chronic concerns, and coordinating follow up . Maximiano Coss, NP

## 2021-07-28 NOTE — Patient Instructions (Addendum)
[name] to see you. In brief:  Blood pressure: take metoprolol (evenings) spironolactone (mornings) irbesartan (mornings) chlorthalidone (mornings) amlodipine (mornings). Will work on chipping away at these as we get better control. Let me know if you have any adverse side effects. Cholesterol: take atorvastatin in evenings Labs today will be back this afternoon. I'll call with any concerns. Return in 3 weeks for blood pressure check, 3 months for visit with me. I have ordered MRI on shoulder. You will get a call to schedule this. Continue to follow with Dr. Lonzo Cloud for diabetes.   Let me know if you have any further concerns   Rich     If you have lab work done today you will be contacted with your lab results within the next 2 weeks.  If you have not heard from [place] then please contact us. The fastest way to get your results is to register for My Chart.   IF you received an x-ray today, you will receive an invoice from [facility]. Please contact [facility] at [phone] with questions or concerns regarding your invoice.   IF you received labwork today, you will receive an invoice from [employer]. Please contact [facility] at [phone] with questions or concerns regarding your invoice.   Our billing staff will not be able to assist you with questions regarding bills from these companies.  You will be contacted with the lab results as soon as they are available. The fastest way to get your results is to activate your My Chart account. Instructions are located on the last page of this paperwork. If you have not heard from [place] regarding the results in 2 weeks, please contact this office.

## 2021-08-11 ENCOUNTER — Ambulatory Visit: Payer: 59 | Admitting: Registered Nurse

## 2021-08-12 ENCOUNTER — Ambulatory Visit (INDEPENDENT_AMBULATORY_CARE_PROVIDER_SITE_OTHER): Payer: 59 | Admitting: Registered Nurse

## 2021-08-12 ENCOUNTER — Ambulatory Visit (HOSPITAL_COMMUNITY)
Admission: RE | Admit: 2021-08-12 | Discharge: 2021-08-12 | Disposition: A | Discharge status: Home / Self Care | Payer: 59 | Source: Ambulatory Visit | Attending: Registered Nurse | Admitting: Registered Nurse

## 2021-08-12 ENCOUNTER — Other Ambulatory Visit: Payer: Self-pay

## 2021-08-12 DIAGNOSIS — G8929 Other chronic pain: Secondary | ICD-10-CM | POA: Diagnosis present

## 2021-08-12 DIAGNOSIS — I10 Essential (primary) hypertension: Secondary | ICD-10-CM

## 2021-08-12 DIAGNOSIS — M25512 Pain in left shoulder: Secondary | ICD-10-CM | POA: Insufficient documentation

## 2021-08-12 NOTE — Progress Notes (Signed)
Patient presents to the office to recheck her blood pressure. Patients blood pressure reading was 120/78. Patient presented no dizziness or headache. Patient will follow up with pcp at next visit.

## 2021-08-13 ENCOUNTER — Other Ambulatory Visit: Payer: Self-pay | Admitting: Internal Medicine

## 2021-08-18 NOTE — Progress Notes (Signed)
Spoke with patient and discussed the results to her recent MRI and gave her the number to the Ortho. Patient states she will give them a call.

## 2021-08-19 ENCOUNTER — Encounter: Payer: Self-pay | Admitting: Internal Medicine

## 2021-08-19 ENCOUNTER — Ambulatory Visit (INDEPENDENT_AMBULATORY_CARE_PROVIDER_SITE_OTHER): Payer: 59 | Admitting: Internal Medicine

## 2021-08-19 ENCOUNTER — Other Ambulatory Visit: Payer: Self-pay

## 2021-08-19 VITALS — BP 132/80 | HR 88 | Ht 63.0 in | Wt 156.0 lb

## 2021-08-19 DIAGNOSIS — Z794 Long term (current) use of insulin: Secondary | ICD-10-CM | POA: Diagnosis not present

## 2021-08-19 DIAGNOSIS — E11319 Type 2 diabetes mellitus with unspecified diabetic retinopathy without macular edema: Secondary | ICD-10-CM | POA: Diagnosis not present

## 2021-08-19 DIAGNOSIS — E1165 Type 2 diabetes mellitus with hyperglycemia: Secondary | ICD-10-CM | POA: Diagnosis not present

## 2021-08-19 DIAGNOSIS — Z8639 Personal history of other endocrine, nutritional and metabolic disease: Secondary | ICD-10-CM

## 2021-08-19 LAB — POCT GLYCOSYLATED HEMOGLOBIN (HGB A1C): Hemoglobin A1C: 13.8 % — AB (ref 4.0–5.6)

## 2021-08-19 LAB — GLUCOSE, POCT (MANUAL RESULT ENTRY): POC Glucose: 370 mg/dl — AB (ref 70–99)

## 2021-08-19 MED ORDER — INSULIN PEN NEEDLE 32G X 4 MM MISC: 1.0000 | Freq: Four times a day (QID) | 3 refills | Status: DC

## 2021-08-19 MED ORDER — NOVOLOG FLEXPEN 100 UNIT/ML ~~LOC~~ SOPN: 6.0000 [IU] | PEN_INJECTOR | Freq: Three times a day (TID) | SUBCUTANEOUS | 11 refills | Status: DC

## 2021-08-19 MED ORDER — FREESTYLE LIBRE 2 SENSOR MISC: 1.0000 | 11 refills | Status: DC

## 2021-08-19 MED ORDER — DEXAMETHASONE 1 MG PO TABS: 1.0000 mg | ORAL_TABLET | Freq: Once | ORAL | 0 refills | Status: AC

## 2021-08-19 NOTE — Patient Instructions (Addendum)
-   Start Novolog 6 units with each meal  - Increase  Lantus to 26  units daily  - Continue Metformin 500 mg , 1 tablets with Breakfast and Supper  - Continue  Ozempic 2 mg weekly        - HOW TO TREAT LOW BLOOD SUGARS (Blood sugar LESS THAN 70 MG/DL) Please follow the RULE OF 15 for the treatment of hypoglycemia treatment (when your (blood sugars are less than 70 mg/dL)   STEP 1: Take 15 grams of carbohydrates when your blood sugar is low, which includes:  3-4 GLUCOSE TABS  OR 3-4 OZ OF JUICE OR REGULAR SODA OR ONE TUBE OF GLUCOSE GEL    STEP 2: RECHECK blood sugar in 15 MINUTES STEP 3: If your blood sugar is still low at the 15 minute recheck --> then, go back to STEP 1 and treat AGAIN with another 15 grams of carbohydrates.    Instructions for Dexamethasone Suppression Test   Step 1: Choose a morning when you can come to our lab at 8:00 am for a blood draw.   Step 2: On the night before the blood draw, take one 1 mg tablet of dexamethasone at 11:30 pm.  The timing is VERY important!   Step 3: The next morning, go to the lab for blood work at 8:00 am.  Bonita Quin do not have to be on an empty stomach, but the timing is VERY important!

## 2021-08-19 NOTE — Progress Notes (Signed)
Name: Yolanda Herrera  Age/ Sex: 50 y.o., female   MRN/ DOB: [id number], 02-18-1971     PCP: Maximiano Coss, NP   Reason for Endocrinology Evaluation: Type 2 Diabetes Mellitus  Initial Endocrine Consultative Visit: 01/11/2020    PATIENT IDENTIFIER: Yolanda Herrera is a 50 y.o. female with a past medical history of DM and dyslipidemia. The patient has followed with Endocrinology clinic since 01/11/2020 for consultative assistance with management of her diabetes.  DIABETIC HISTORY:  Yolanda Herrera was diagnosed with DM in 2011.  She has been on Metformin since her diagnosis, as well as glimepiride.  Yolanda Herrera was cost prohibitive.  Ozempic was started in 12/2019.  Her hemoglobin A1c has ranged from 9.2% in 2019, peaking at 14.9% in 2021.   On her initial visit to our clinic, her A1c was 14.9%   , she was on Metformin, glimepiride, Ozempic, and NPH.  We stopped  glimepiride and NPH, we increase Metformin, continue to Ozempic and started Lantus.   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    She was evaluated by GI for pancreatic cyst 07/2021 , MRCP recommended    SUBJECTIVE:   During the last visit (05/18/2021): A1c 14.1 %, we increased Lantus and continued Metformin and Ozempic     Today (08/19/2021): Yolanda Herrera is here for follow-up on diabetes management. She checks her blood sugars occasionally  . The patient has not had hypoglycemic episodes since the last clinic visit     Weight has  been stable  Denies nausea or diarrhea     HOME DIABETES REGIMEN:  Lantus 24 units daily  Metformin 500 mg, 2 tabs  BID- taking 1 tablet BID  Ozempic 2 mg weekly  Avapro 150 mg daily Metoprolol 100 mg daily Amlodipine 10 mg daily spironolactone 50 mg daily Chlorthalidone 25 mg daily       Statin: Yes ACE-I/ARB: Yes   METER DOWNLOAD SUMMARY: Did not bring  BG > 160 mg/dL    DIABETIC COMPLICATIONS: Microvascular complications:  Retinopathy ? Right,  Denies: neuropathy  Last eye exam: Completed 12/2019   Macrovascular complications:    Denies: CAD, PVD, CVA  HISTORY:  Past Medical History:  Past Medical History:  Diagnosis Date   Diabetes mellitus without complication ([place])    Hyperlipidemia    Hypertension    Past Surgical History:  Past Surgical History:  Procedure Laterality Date   CESAREAN SECTION     UTERINE FIBROID SURGERY     Social History:  reports that she has never smoked. She has never used smokeless tobacco. She reports that she does not drink alcohol and does not use drugs. Family History:  Family History  Problem Relation Age of Onset   Diabetes Mother    Diabetes Father    Kidney disease Father    Hypertension Father    Hypertension Sister    Stroke Sister      HOME MEDICATIONS: Allergies as of 08/19/2021   No  Known Allergies      Medication List        Accurate as of August 19, 2021 10:17 AM. If you have any questions, ask your nurse or doctor.          Accu-Chek Guide test strip Generic drug: glucose blood USE UP TO THREE TIMES DAILY   amLODipine 10 MG tablet Commonly known as: NORVASC Take 1 tablet (10 mg total) by mouth daily.   atorvastatin 20 MG tablet Commonly known as: LIPITOR Take 1 tablet (20 mg total) by mouth daily.   blood glucose meter kit and supplies per insurance preference. Use up to three times daily as directed. E1.9, Z79.4 Please dispense lancets and glucose test stripes to match  glucometer, 100 each   chlorthalidone 25 MG tablet Commonly known as: HYGROTON Take 1 tablet (25 mg total) by mouth daily.   Insulin Pen Needle 32G X 4 MM Misc 1 Device by Does not apply route daily.   Insulin Syringes (Disposable) U-100 0.5 ML Misc Use twice with a day with NPH. Dx E11.65, Z79.4   irbesartan 150 MG tablet Commonly known as: AVAPRO Take 1 tablet (150 mg total) by mouth daily.   Lantus SoloStar 100 UNIT/ML Solostar Pen Generic drug: insulin glargine Inject 24 Units into the skin daily.   metFORMIN 500 MG 24 hr tablet Commonly known as: Glucophage XR Take 2 tablets (1,000 mg total) by mouth in the morning and at bedtime.   metoprolol succinate 100 MG 24 hr tablet Commonly known as: TOPROL-XL Take 1 tablet (100 mg total) by mouth daily. Take with or immediately following a meal.   omeprazole 20 MG capsule Commonly known as: PRILOSEC Take 1 capsule (20 mg total) by mouth 2 (two) times daily before a meal.   Ozempic (1 MG/DOSE) 4 MG/3ML Sopn Generic drug: Semaglutide (1 MG/DOSE) Inject 2 mg into the skin once a week.   Plenvu 140 g Solr Generic drug: PEG-KCl-NaCl-NaSulf-Na Asc-C Use as directed. Manufacturer's coupon Universal coupon code:BIN: P2366821; GROUP: [id number]; PCN: [id number]; ID: [id number]; PAY NO MORE $50; NO prior authorization   potassium chloride 10 MEQ tablet Commonly known as: KLOR-CON Take 1 tablet (10 mEq total) by mouth 2 (two) times daily.   spironolactone 50 MG tablet Commonly known as: ALDACTONE Take 1 tablet (50 mg total) by mouth daily.         OBJECTIVE:   Vital Signs: BP 132/80 (BP Location: Left Arm, Patient Position: Sitting, Cuff Size: Small)   Pulse 88   Ht 5' 3"  (1.6 m)   Wt 156 lb (70.8 kg)   LMP 01/18/2018 (Approximate)   SpO2 95%   BMI 27.63 kg/m   Wt Readings from Last 3 Encounters:  07/28/21 153 lb 6.4 oz (69.6 kg)  07/16/21 155 lb (70.3 kg)  05/18/21 155 lb (70.3 kg)     Exam: General: Pt appears  well and is in NAD  Lungs: Clear with good BS bilat   Heart: RRR  Abdomen: Normoactive bowel sounds, soft, nontender, without masses or organomegaly palpable  Extremities: No pretibial edema. .  Neuro: MS is good with appropriate affect, pt is alert and Ox3    DM foot exam: 08/01/2020  The skin of the feet is intact without sores or ulcerations. The pedal pulses are 2+ on right and 2+ on left. The sensation is decreased  to a screening 5.07, 10 gram monofilament on the right    DATA REVIEWED:  Lab Results  Component Value Date  HGBA1C 14.1 (A) 05/18/2021   HGBA1C 9.4 (A) 01/14/2021   HGBA1C 9.4 (H) 11/10/2020   Results for Yolanda Herrera, Yolanda Herrera (MRN [id number]) as of 08/19/2021 10:17  Ref. Range 07/28/2021 11:00  Sodium Latest Ref Range: 135 - 145 mEq/L 133 (L)  Potassium Latest Ref Range: 3.5 - 5.1 mEq/L 4.3  Chloride Latest Ref Range: 96 - 112 mEq/L 95 (L)  CO2 Latest Ref Range: 19 - 32 mEq/L 25  Glucose Latest Ref Range: 70 - 99 mg/dL 474 (H)  BUN Latest Ref Range: 6 - 23 mg/dL 8  Creatinine Latest Ref Range: 0.40 - 1.20 mg/dL 0.94  Calcium Latest Ref Range: 8.4 - 10.5 mg/dL 10.0  Alkaline Phosphatase Latest Ref Range: 39 - 117 U/L 97  Albumin Latest Ref Range: 3.5 - 5.2 g/dL 4.6  AST Latest Ref Range: 0 - 37 U/L 12  ALT Latest Ref Range: 0 - 35 U/L 15  Total Protein Latest Ref Range: 6.0 - 8.3 g/dL 7.7  Total Bilirubin Latest Ref Range: 0.2 - 1.2 mg/dL 0.4  GFR Latest Ref Range: >60.00 mL/min 70.46  Total CHOL/HDL Ratio Unknown 4  Cholesterol Latest Ref Range: 0 - 200 mg/dL 150  HDL Cholesterol Latest Ref Range: >39.00 mg/dL 38.30 (L)  LDL (calc) Latest Ref Range: 0 - 99 mg/dL 80  NonHDL Unknown 111.21  Triglycerides Latest Ref Range: 0.0 - 149.0 mg/dL 154.0 (H)  VLDL Latest Ref Range: 0.0 - 40.0 mg/dL 30.8   CT abdomen 06/05/2021   IMPRESSION: 1. Bilateral adrenal glands appear normal. 2. Prominence of the intra and extrahepatic biliary tree with the common duct  measuring 8 mm. Correlation with laboratory values is suggested to exclude biliary ductal obstruction. 3. Cystic 7 mm lesion in the body of the pancreas without suspicious soft tissue enhancement and no pancreatic ductal dilation. Favored to represent a side branch IPMN or pancreatic pseudocyst. Recommend follow up pre and post contrast MRI/MRCP or pancreatic protocol CT in 1 year. This recommendation follows ACR consensus guidelines: Management of Incidental Pancreatic Cysts: A White Paper of the ACR Incidental Findings Committee. J Am Coll Radiol 4166;06:301-601. 4. Right lower pole renal lesion measuring 9 mm with additional tiny bilateral hypodense renal lesions which are technically too small to accurately characterize but favored to represent cysts. 5.  Aortic Atherosclerosis (ICD10-I70.0).    In office BG 370 mg/dL  ASSESSMENT / PLAN / RECOMMENDATIONS:   1) Type 2 Diabetes Mellitus, poorly controlled, With retinopathic complications - Most recent A1c of  13.8 %. Goal A1c < 7.0 %.   -Patient continues with hyperglycemia despite increasing her insulin,  and Ozempic on the last visit. -We discussed adding prandial insulin which she is in agreement of (reluctantly) -She does not like metformin and would like to not increase the dose -In the past we discussed SGLT2 inhibitors but were cost prohibitive, we also discussed in the past pioglitazone but she was reluctant to start due to weight gain. -We will make the following changes -In office BG 370 mg/DL after eating a piece of bread with jelly and butter  MEDICATIONS:  -Start NovoLog 6 units with each meal - Increase Lantus 26 units daily  -Continue metformin 500 mg , 1 tab twice daily -Continue Ozempic  2 mg weekly    EDUCATION / INSTRUCTIONS: BG monitoring instructions: Patient is instructed to check her blood sugars 1 times a day. Call [facility] if: BG persistently < 70  I reviewed the Rule of 15 for the  treatment of  hypoglycemia in detail with the patient. Literature supplied.    2) Diabetic complications:  Eye: Does have known diabetic retinopathy.  Neuro/ Feet: Does not have known diabetic peripheral neuropathy. Renal: Patient does not have known baseline CKD. She is  on an ACEI/ARB at present   3) HTN:   -In review of records she has been noted to have a suppressed renin<0.167, aldosterone of 17.5 with an elevated Aldo/renin ratio> 104.8, this was done in 2019 through cardiology, she was started on Spironolactone at the time  - CT scan of adrenal did not show evidence of adenoma 06/2021 - Hyperaldo confirmation , offered to the pt at the time but opted for medical management with Spironolactone   -Her PCP switched her from HCTZ to chlorthalidone, no changes will be made today -In the future will consider increasing spironolactone and discontinuing or reducing her other antihypertensive meds  Continue chlorthalidone 25 mg daily Continue lisinopril 40 mg daily Continue metoprolol 100 mg daily Continue amlodipine 10 mg daily Continue spironolactone 50 mg  daily    4) Hx of hypokalemia:  -Most likely due to hyper Aldo.  But will screen for Cushing syndrome with dexamethasone suppression test -Patient will return for fasting 8 AM cortisol check, emphasized the importance of taking 1 mg of dexamethasone at 11:30 PM prior night -She understands this will cause hyperglycemia and this is why I believe starting prandial insulin will be an effective way to start improving her glycemic control  F/U in 3 months  Signed electronically by: Mack Guise, MD  [facility]  [facility] [address] [place], [place] [address] Phone: [phone] FAX: [phone]   CC: Maximiano Coss, NP [address] [place] [address] Phone: [phone]  Fax: [phone]  Return to Endocrinology clinic as below: Future Appointments  Date  Time Provider [place]  09/21/2021  8:30 AM [facility] PREVISIT RM 52 [facility]  10/05/2021  1:30 PM Daryel November, MD [facility]

## 2021-08-20 ENCOUNTER — Ambulatory Visit: Payer: 59 | Admitting: Orthopaedic Surgery

## 2021-08-20 ENCOUNTER — Encounter: Payer: 59 | Admitting: Gastroenterology

## 2021-08-24 ENCOUNTER — Other Ambulatory Visit: Payer: 59

## 2021-08-28 ENCOUNTER — Other Ambulatory Visit: Payer: Self-pay

## 2021-08-28 ENCOUNTER — Other Ambulatory Visit (INDEPENDENT_AMBULATORY_CARE_PROVIDER_SITE_OTHER): Payer: 59

## 2021-08-28 ENCOUNTER — Encounter: Payer: Self-pay | Admitting: Internal Medicine

## 2021-08-28 DIAGNOSIS — Z8639 Personal history of other endocrine, nutritional and metabolic disease: Secondary | ICD-10-CM | POA: Diagnosis not present

## 2021-08-28 LAB — CORTISOL: Cortisol, Plasma: 1 ug/dL

## 2021-09-16 ENCOUNTER — Other Ambulatory Visit: Payer: Self-pay | Admitting: Internal Medicine

## 2021-09-16 DIAGNOSIS — I10 Essential (primary) hypertension: Secondary | ICD-10-CM

## 2021-10-05 ENCOUNTER — Telehealth: Payer: Self-pay | Admitting: Gastroenterology

## 2021-10-05 ENCOUNTER — Encounter: Payer: 59 | Admitting: Gastroenterology

## 2021-10-05 NOTE — Telephone Encounter (Signed)
Called patient at 12:45pm to see if she would be coming for her procedure she stated that she had forgot.  She did not prep.  She will call back to reschedule.

## 2021-11-05 ENCOUNTER — Other Ambulatory Visit: Payer: Self-pay | Admitting: Internal Medicine

## 2021-11-06 ENCOUNTER — Other Ambulatory Visit (HOSPITAL_COMMUNITY): Payer: Self-pay

## 2021-11-06 ENCOUNTER — Telehealth: Payer: Self-pay | Admitting: Pharmacy Technician

## 2021-11-06 ENCOUNTER — Other Ambulatory Visit: Payer: Self-pay | Admitting: Internal Medicine

## 2021-11-06 MED ORDER — INSULIN GLARGINE-YFGN 100 UNIT/ML ~~LOC~~ SOLN: 26.0000 [IU] | Freq: Every day | SUBCUTANEOUS | 3 refills | Status: DC

## 2021-11-06 NOTE — Telephone Encounter (Signed)
Patient Advocate Encounter  Update: Designer, industrial/product, [place] [phone] and was able to start PA request over the phone. Faxed most recent office notes and labs to [phone] May take up to 72 hours for response.   Case #: [id number]

## 2021-11-06 NOTE — Telephone Encounter (Signed)
Patient Advocate Encounter   Received notification from CoverMyMeds that prior authorization for Ozempic 2mg  is required by his/her .  Per Test Claim: requires step therapy (doesn't give preferred drug)   PA NOT submitted on 11/06/21- CMM unable to respond with clinical questions. This appears at the top of the page:  01/04/22 Rx Prior Authorization Department is unable to review this request at this time. Our records indicate that this member is not eligible for prior authorizations or that the review of prior authorizations is not delegated to [employer] Rx Prior Authorization Department. Please review the member's insurance demographics and submit to the appropriate party. Thank you in advance. This is listed at the bottom of the page: Please also make note of the following in a free text field (if available): What is the patients diagnosis?  Has the patient tried and failed or been unable to tolerate metformin (alone or in combination)? (Y/N)  What is the patients most recent hemoglobin A1c?   Is this a request for an initiation or continuation of therapy?  Does the prescriber believe there has been a positive clinical response while on OZEMPIC (e.g. documented reduction in hemoglobin A1c)? (Y/N)

## 2021-11-06 NOTE — Telephone Encounter (Signed)
Patient Advocate Encounter  Prior Authorization for Ozempic 2mg  has been approved.    Request ID: Effective dates: 11/06/21 through 11/06/22  Per Test Claim Patients co-pay is $74.99 for 3 month supply. If this is not a 01/05/23, the pt is likely also eligible for a copay card that will bring it down to $24.99 for the 3 months supply.   Spoke with Pharmacy to Process. They can only do 1 month at a time due to manufacturer back order. Hoping to have enough for everyone on it. Copay there is $86.85  Patient Advocate Fax:  [phone]

## 2021-11-06 NOTE — Telephone Encounter (Signed)
Patient Advocate Encounter   Received notification from The pharmacy, when I called to process Ozempic, that prior authorization for Lantus is required by his/her insurance [employer].  Per Test Claim: Non-formulary. Basaglar also not covered. By Lamonte Richer tried Semglee and it is covered.

## 2021-11-10 NOTE — Telephone Encounter (Signed)
Left vm for patient to callback to advise on medication change

## 2021-11-10 NOTE — Telephone Encounter (Signed)
Do I need to send to [facility]

## 2021-11-11 ENCOUNTER — Other Ambulatory Visit: Payer: Self-pay

## 2021-11-11 ENCOUNTER — Other Ambulatory Visit: Payer: Self-pay | Admitting: Internal Medicine

## 2021-11-11 ENCOUNTER — Other Ambulatory Visit (HOSPITAL_COMMUNITY): Payer: Self-pay

## 2021-11-11 DIAGNOSIS — I10 Essential (primary) hypertension: Secondary | ICD-10-CM

## 2021-11-11 MED ORDER — OZEMPIC (1 MG/DOSE) 4 MG/3ML ~~LOC~~ SOPN
2.0000 mg | PEN_INJECTOR | SUBCUTANEOUS | 6 refills | Status: DC
  Filled 2021-11-11: qty 6, 28d supply, fill #0

## 2021-11-11 MED ORDER — SEMAGLUTIDE (2 MG/DOSE) 8 MG/3ML ~~LOC~~ SOPN: 2.0000 mg | PEN_INJECTOR | SUBCUTANEOUS | 3 refills | Status: DC

## 2021-11-11 NOTE — Telephone Encounter (Signed)
Patient would like script to be sent to [facility].

## 2021-11-19 ENCOUNTER — Other Ambulatory Visit: Payer: Self-pay

## 2021-11-19 ENCOUNTER — Encounter: Payer: Self-pay | Admitting: Internal Medicine

## 2021-11-19 ENCOUNTER — Ambulatory Visit (INDEPENDENT_AMBULATORY_CARE_PROVIDER_SITE_OTHER): Payer: 59 | Admitting: Internal Medicine

## 2021-11-19 VITALS — BP 130/80 | HR 95 | Ht 63.0 in | Wt 150.6 lb

## 2021-11-19 DIAGNOSIS — E11319 Type 2 diabetes mellitus with unspecified diabetic retinopathy without macular edema: Secondary | ICD-10-CM

## 2021-11-19 DIAGNOSIS — Z794 Long term (current) use of insulin: Secondary | ICD-10-CM | POA: Diagnosis not present

## 2021-11-19 DIAGNOSIS — Z8639 Personal history of other endocrine, nutritional and metabolic disease: Secondary | ICD-10-CM | POA: Diagnosis not present

## 2021-11-19 DIAGNOSIS — E1165 Type 2 diabetes mellitus with hyperglycemia: Secondary | ICD-10-CM | POA: Diagnosis not present

## 2021-11-19 DIAGNOSIS — E269 Hyperaldosteronism, unspecified: Secondary | ICD-10-CM | POA: Diagnosis not present

## 2021-11-19 LAB — POCT GLUCOSE (DEVICE FOR HOME USE): Glucose Fasting, POC: 277 mg/dL — AB (ref 70–99)

## 2021-11-19 LAB — POCT GLYCOSYLATED HEMOGLOBIN (HGB A1C): Hemoglobin A1C: 14.6 % — AB (ref 4.0–5.6)

## 2021-11-19 MED ORDER — INSULIN GLARGINE-YFGN 100 UNIT/ML ~~LOC~~ SOLN: 30.0000 [IU] | Freq: Every day | SUBCUTANEOUS | 3 refills | Status: DC

## 2021-11-19 MED ORDER — SEMAGLUTIDE (2 MG/DOSE) 8 MG/3ML ~~LOC~~ SOPN: 2.0000 mg | PEN_INJECTOR | SUBCUTANEOUS | 3 refills | Status: DC

## 2021-11-19 MED ORDER — NOVOLOG FLEXPEN 100 UNIT/ML ~~LOC~~ SOPN: 6.0000 [IU] | PEN_INJECTOR | Freq: Three times a day (TID) | SUBCUTANEOUS | 11 refills | Status: DC

## 2021-11-19 MED ORDER — METFORMIN HCL ER 500 MG PO TB24: 500.0000 mg | ORAL_TABLET | Freq: Two times a day (BID) | ORAL | 3 refills | Status: DC

## 2021-11-19 NOTE — Patient Instructions (Addendum)
-   Start Novolog 6 units with each meal  - Increase Semglee to 30 units daily  - Continue Metformin 500 mg , 1 tablets with Breakfast and Supper  - Continue  Ozempic 2 mg weekly        - HOW TO TREAT LOW BLOOD SUGARS (Blood sugar LESS THAN 70 MG/DL) Please follow the RULE OF 15 for the treatment of hypoglycemia treatment (when your (blood sugars are less than 70 mg/dL)   STEP 1: Take 15 grams of carbohydrates when your blood sugar is low, which includes:  3-4 GLUCOSE TABS  OR 3-4 OZ OF JUICE OR REGULAR SODA OR ONE TUBE OF GLUCOSE GEL    STEP 2: RECHECK blood sugar in 15 MINUTES STEP 3: If your blood sugar is still low at the 15 minute recheck --> then, go back to STEP 1 and treat AGAIN with another 15 grams of carbohydrates.

## 2021-11-19 NOTE — Progress Notes (Signed)
Name: Rodneshia Greenhouse  Age/ Sex: 51 y.o., female   MRN/ DOB: [id number], [birth year]     PCP: Maximiano Coss, NP   Reason for Endocrinology Evaluation: Type 2 Diabetes Mellitus  Initial Endocrine Consultative Visit: 01/11/2020    PATIENT IDENTIFIER: Ms. Anetta Olvera is a 51 y.o. female with a past medical history of DM and dyslipidemia. The patient has followed with Endocrinology clinic since 01/11/2020 for consultative assistance with management of her diabetes.  DIABETIC HISTORY:  Ms. Mcclaran was diagnosed with DM in 2011.  She has been on Metformin since her diagnosis, as well as glimepiride.  Vania Rea was cost prohibitive.  Ozempic was started in 12/2019.  Her hemoglobin A1c has ranged from 9.2% in 2019, peaking at 14.9% in 2021.   On her initial visit to our clinic, her A1c was 14.9%   , she was on Metformin, glimepiride, Ozempic, and NPH.  We stopped  glimepiride and NPH, we increase Metformin, continue to Ozempic and started Lantus.   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   Dexamethasone suppression test normal 08/2021   She was evaluated by GI for pancreatic cyst 07/2021  SUBJECTIVE:   During the last visit (08/19/2021): A1c 13.8 %, we increased Lantus and continued Metformin and Ozempic and started novolog      Today (11/19/2021): Ms. Mac is here for follow-up on diabetes management. She checks her blood sugars occasionally . The patient  has not had hypoglycemic episodes since the last clinic visit   She has freestyle libre 2 ut needs training   She has been noted with weight  loss  This am has a sensation in her stomach that she described as weakness but no nausea  Denies nausea or diarrhea    Has not taken Ozemoic in 6 weeks due to shortage    HOME DIABETES REGIMEN:  Semglee 26 units daily  Novolog 6 units with each meal  Metformin 500 mg, 1 tabs  BID Ozempic 2 mg weekly  Avapro 150 mg daily Metoprolol 100 mg daily Amlodipine 10 mg daily spironolactone 50 mg daily Chlorthalidone 25 mg daily       Statin: Yes ACE-I/ARB: Yes   METER DOWNLOAD SUMMARY: Did not bring  BG > 160 mg/dL    DIABETIC COMPLICATIONS: Microvascular complications:  Retinopathy ? Right,  Denies: neuropathy  Last eye exam: Completed 12/2019   Macrovascular complications:    Denies: CAD, PVD, CVA  HISTORY:  Past Medical History:  Past Medical History:  Diagnosis Date   Diabetes mellitus without complication ([place])    Hyperlipidemia    Hypertension    Past Surgical History:  Past Surgical History:  Procedure Laterality Date   CESAREAN SECTION     UTERINE FIBROID SURGERY     Social History:  reports that she has never smoked. She has never used smokeless tobacco. She reports that she does not drink alcohol and does not use drugs. Family History:  Family  History  Problem Relation Age of Onset   Diabetes Mother    Diabetes Father    Kidney disease Father    Hypertension Father    Hypertension Sister    Stroke Sister      HOME MEDICATIONS: Allergies as of 11/19/2021   No Known Allergies      Medication List        Accurate as of November 19, 2021 10:27 AM. If you have any questions, ask your nurse or doctor.          Accu-Chek Guide test strip Generic drug: glucose blood USE UP TO THREE TIMES DAILY   amLODipine 10 MG tablet Commonly known as: NORVASC Take 1 tablet (10 mg total) by mouth daily.    atorvastatin 20 MG tablet Commonly known as: LIPITOR Take 1 tablet (20 mg total) by mouth daily.   blood glucose meter kit and supplies per insurance preference. Use up to three times daily as directed. E1.9, Z79.4 Please dispense lancets and glucose test stripes to match glucometer, 100 each   chlorthalidone 25 MG tablet Commonly known as: HYGROTON Take 1 tablet (25 mg total) by mouth daily.   FreeStyle Libre 2 Sensor Misc 1 Device by Does not apply route every 14 (fourteen) days.   insulin glargine-yfgn 100 UNIT/ML injection Commonly known as: Semglee (yfgn) Inject 0.26 mLs (26 Units total) into the skin daily.   Insulin Pen Needle 32G X 4 MM Misc 1 Device by Does not apply route in the morning, at noon, in the evening, and at bedtime.   Insulin Syringes (Disposable) U-100 0.5 ML Misc Use twice with a day with NPH. Dx E11.65, Z79.4   irbesartan 150 MG tablet Commonly known as: AVAPRO Take 1 tablet (150 mg total) by mouth daily.   metFORMIN 500 MG 24 hr tablet Commonly known as: Glucophage XR Take 2 tablets (1,000 mg total) by mouth in the morning and at bedtime.   metoprolol succinate 100 MG 24 hr tablet Commonly known as: TOPROL-XL Take 1 tablet (100 mg total) by mouth daily. Take with or immediately following a meal.   NovoLOG FlexPen 100 UNIT/ML FlexPen Generic drug: insulin aspart Inject 6 Units into the skin 3 (three) times daily with meals.   omeprazole 20 MG capsule Commonly known as: PRILOSEC Take 1 capsule (20 mg total) by mouth 2 (two) times daily before a meal.   Plenvu 140 g Solr Generic drug: PEG-KCl-NaCl-NaSulf-Na Asc-C Use as directed. Manufacturer's coupon Universal coupon code:BIN: P2366821; GROUP: [id number]; PCN: [id number]; ID: [id number]; PAY NO MORE $50; NO prior authorization   potassium chloride 10 MEQ tablet Commonly known as: KLOR-CON Take 1 tablet (10 mEq total) by mouth 2 (two) times daily.   Semaglutide (2 MG/DOSE) 8 MG/3ML Sopn Inject 2  mg as directed once a week.   spironolactone 50 MG tablet Commonly known as: ALDACTONE Take 1 tablet (50 mg total) by mouth daily.         OBJECTIVE:   Vital Signs: BP 130/80 (BP Location: Left Arm, Patient Position: Sitting, Cuff Size: Small)    Pulse 95    Ht 5' 3"  (1.6 m)    Wt 150 lb 9.6 oz (68.3 kg)    LMP 01/18/2018 (Approximate)    SpO2 99%    BMI 26.68 kg/m   Wt Readings from Last 3 Encounters:  11/19/21 150 lb 9.6 oz (68.3 kg)  08/19/21 156 lb (70.8 kg)  07/28/21 153 lb 6.4 oz (69.6 kg)     Exam:  General: Pt appears well and is in NAD  Lungs: Clear with good BS bilat   Heart: RRR  Abdomen: Normoactive bowel sounds, soft, nontender, without masses or organomegaly palpable  Extremities: No pretibial edema. .  Neuro: MS is good with appropriate affect, pt is alert and Ox3    DM foot exam: 08/01/2020  The skin of the feet is intact without sores or ulcerations. The pedal pulses are 2+ on right and 2+ on left. The sensation is decreased  to a screening 5.07, 10 gram monofilament on the right    DATA REVIEWED:  Lab Results  Component Value Date   HGBA1C 14.6 (A) 11/19/2021   HGBA1C 13.8 (A) 08/19/2021   HGBA1C 14.1 (A) 05/18/2021   Results for SAUMYA, HUKILL (MRN [id number]) as of 08/19/2021 10:17  Ref. Range 07/28/2021 11:00  Sodium Latest Ref Range: 135 - 145 mEq/L 133 (L)  Potassium Latest Ref Range: 3.5 - 5.1 mEq/L 4.3  Chloride Latest Ref Range: 96 - 112 mEq/L 95 (L)  CO2 Latest Ref Range: 19 - 32 mEq/L 25  Glucose Latest Ref Range: 70 - 99 mg/dL 474 (H)  BUN Latest Ref Range: 6 - 23 mg/dL 8  Creatinine Latest Ref Range: 0.40 - 1.20 mg/dL 0.94  Calcium Latest Ref Range: 8.4 - 10.5 mg/dL 10.0  Alkaline Phosphatase Latest Ref Range: 39 - 117 U/L 97  Albumin Latest Ref Range: 3.5 - 5.2 g/dL 4.6  AST Latest Ref Range: 0 - 37 U/L 12  ALT Latest Ref Range: 0 - 35 U/L 15  Total Protein Latest Ref Range: 6.0 - 8.3 g/dL 7.7  Total Bilirubin Latest Ref  Range: 0.2 - 1.2 mg/dL 0.4  GFR Latest Ref Range: >60.00 mL/min 70.46  Total CHOL/HDL Ratio Unknown 4  Cholesterol Latest Ref Range: 0 - 200 mg/dL 150  HDL Cholesterol Latest Ref Range: >39.00 mg/dL 38.30 (L)  LDL (calc) Latest Ref Range: 0 - 99 mg/dL 80  NonHDL Unknown 111.21  Triglycerides Latest Ref Range: 0.0 - 149.0 mg/dL 154.0 (H)  VLDL Latest Ref Range: 0.0 - 40.0 mg/dL 30.8   CT abdomen 06/05/2021   IMPRESSION: 1. Bilateral adrenal glands appear normal. 2. Prominence of the intra and extrahepatic biliary tree with the common duct measuring 8 mm. Correlation with laboratory values is suggested to exclude biliary ductal obstruction. 3. Cystic 7 mm lesion in the body of the pancreas without suspicious soft tissue enhancement and no pancreatic ductal dilation. Favored to represent a side branch IPMN or pancreatic pseudocyst. Recommend follow up pre and post contrast MRI/MRCP or pancreatic protocol CT in 1 year. This recommendation follows ACR consensus guidelines: Management of Incidental Pancreatic Cysts: A White Paper of the ACR Incidental Findings Committee. J Am Coll Radiol 1751;02:585-277. 4. Right lower pole renal lesion measuring 9 mm with additional tiny bilateral hypodense renal lesions which are technically too small to accurately characterize but favored to represent cysts. 5.  Aortic Atherosclerosis (ICD10-I70.0).    In office BG 370 mg/dL  ASSESSMENT / PLAN / RECOMMENDATIONS:   1) Type 2 Diabetes Mellitus, poorly controlled, With retinopathic complications - Most recent A1c of  14.6 %. Goal A1c < 7.0 %.   -Poorly controlled diabetes due to medication non-adherence. Pt states her pharmacy told her they didn't have Novolog  ( this was sent 08/2021) when I asked her why she didn;t contact us, pt states she doesn't like needles  - She has been without Ozempic, a prescription was sent to [facility] a few weeks  ago, today she asked me to send it to [facility], a coupon was  provided as well   -In the past we discussed SGLT2 inhibitors but were cost prohibitive, we also discussed in the past pioglitazone but she was reluctant to start due to weight gain.  - I have encouraged her to start prandial insulin with each meal     MEDICATIONS:  -Start NovoLog 6 units with each meal - Increase Semglee 30  units daily  -Continue metformin 500 mg , 1 tab twice daily -Continue  Ozempic  2 mg weekly    EDUCATION / INSTRUCTIONS: BG monitoring instructions: Patient is instructed to check her blood sugars 1 times a day. Call [facility] if: BG persistently < 70  I reviewed the Rule of 15 for the treatment of hypoglycemia in detail with the patient. Literature supplied.    2) Diabetic complications:  Eye: Does have known diabetic retinopathy.  Neuro/ Feet: Does not have known diabetic peripheral neuropathy. Renal: Patient does not have known baseline CKD. She is  on an ACEI/ARB at present   3) HTN:   -In review of records she has been noted to have a suppressed renin<0.167, aldosterone of 17.5 with an elevated Aldo/renin ratio> 104.8, this was done in 2019 through cardiology, she was started on Spironolactone at the time  - CT scan of adrenal did not show evidence of adenoma 06/2021 - Hyperaldo confirmation , offered to the pt at the time but opted for medical management with Spironolactone   -Her PCP switched her from HCTZ to chlorthalidone, BP normal today and no changes today  -In the future will consider increasing spironolactone and discontinuing or reducing her other antihypertensive meds  Continue chlorthalidone 25 mg daily Continue lisinopril 40 mg daily Continue metoprolol 100 mg daily Continue amlodipine 10 mg daily Continue spironolactone 50 mg  daily    4) Hx of hypokalemia:  -Most likely due to hyper Aldo.  But will screen for Cushing syndrome with dexamethasone suppression test -Patient will return for fasting 8 AM cortisol  check, emphasized the importance of taking 1 mg of dexamethasone at 11:30 PM prior night -She understands this will cause hyperglycemia and this is why I believe starting prandial insulin will be an effective way to start improving her glycemic control  F/U in 4 months  Signed electronically by: Mack Guise, MD  [facility]  [facility] [address] [place], [place] [address] Phone: [phone] FAX: [phone]   CC: Maximiano Coss, NP [address] [place] [address] Phone: [phone]  Fax: [phone]  Return to Endocrinology clinic as below: No future appointments.

## 2021-12-03 ENCOUNTER — Other Ambulatory Visit: Payer: Self-pay | Admitting: Registered Nurse

## 2021-12-03 DIAGNOSIS — I10 Essential (primary) hypertension: Secondary | ICD-10-CM

## 2021-12-15 ENCOUNTER — Other Ambulatory Visit: Payer: Self-pay | Admitting: Registered Nurse

## 2021-12-15 DIAGNOSIS — I10 Essential (primary) hypertension: Secondary | ICD-10-CM

## 2021-12-18 ENCOUNTER — Other Ambulatory Visit: Payer: Self-pay

## 2021-12-18 DIAGNOSIS — I10 Essential (primary) hypertension: Secondary | ICD-10-CM

## 2021-12-18 MED ORDER — METOPROLOL SUCCINATE ER 100 MG PO TB24: 100.0000 mg | ORAL_TABLET | Freq: Every day | ORAL | 0 refills | Status: DC

## 2021-12-27 ENCOUNTER — Other Ambulatory Visit: Payer: Self-pay | Admitting: Registered Nurse

## 2021-12-27 DIAGNOSIS — E785 Hyperlipidemia, unspecified: Secondary | ICD-10-CM

## 2022-02-28 ENCOUNTER — Other Ambulatory Visit: Payer: Self-pay | Admitting: Registered Nurse

## 2022-02-28 DIAGNOSIS — I10 Essential (primary) hypertension: Secondary | ICD-10-CM

## 2022-03-24 ENCOUNTER — Ambulatory Visit: Payer: 59 | Admitting: Internal Medicine

## 2022-04-22 ENCOUNTER — Ambulatory Visit: Payer: 59 | Admitting: Internal Medicine

## 2022-04-29 IMAGING — MR MR SHOULDER*L* W/O CM
5 of 9 series · 19 of 40 positions shown · non-contrast
Comparison: X-ray shoulder 03/18/2021.

CLINICAL DATA: Patient complains of chronic left shoulder pain.
Assess adhesive capsulitis.

EXAM:
MRI OF THE LEFT SHOULDER WITHOUT CONTRAST
TECHNIQUE: Multiplanar, multisequence MR imaging of the shoulder was performed.
No intravenous contrast was administered.

[Series 3: T2 fat-sat · axial · 4.0mm · 0.27mm/px · z∈[+1,+97]mm · 6 of 21 slices shown (1 of 4)]
[im 1/21]
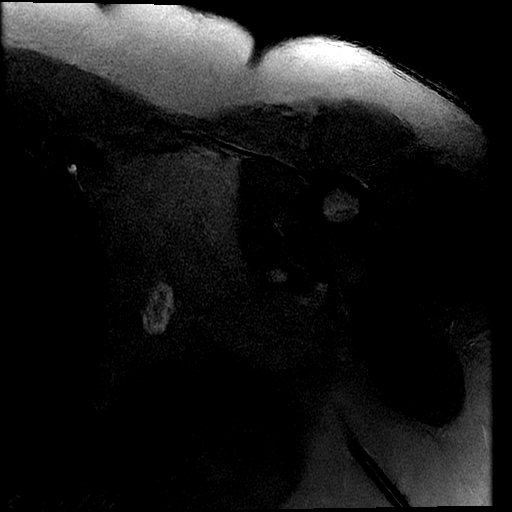
[im 5/21]
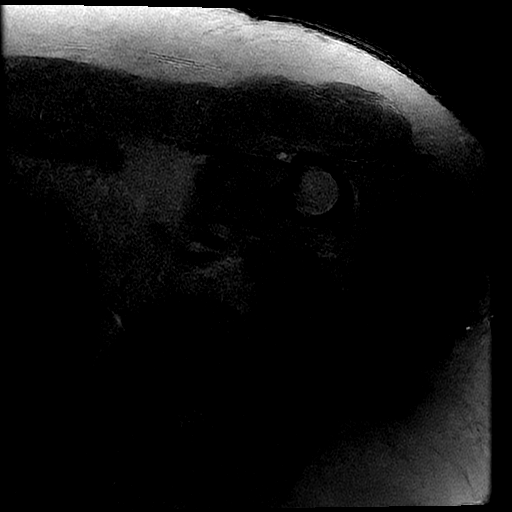
[im 9/21]
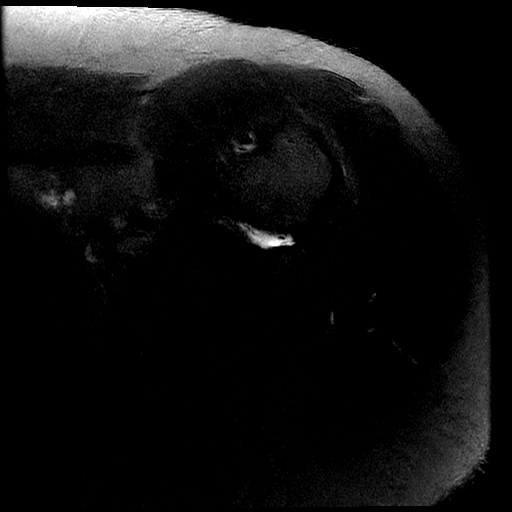
[im 13/21]
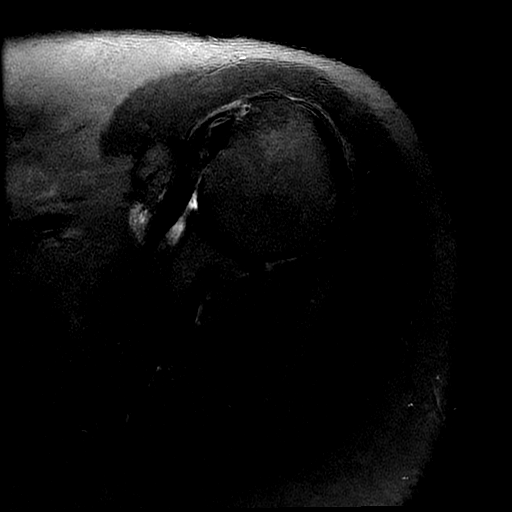
[im 17/21]
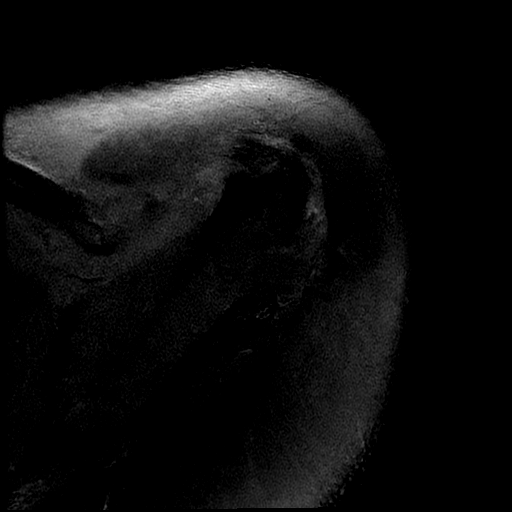
[im 21/21]
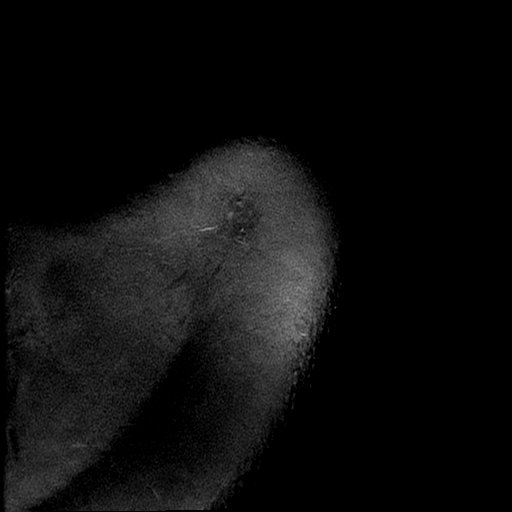

[Series 4: T2 fat-sat · sagittal · 4.0mm · 0.27mm/px · 4 of 14 slices shown (2 of 4)]
[im 1/14]
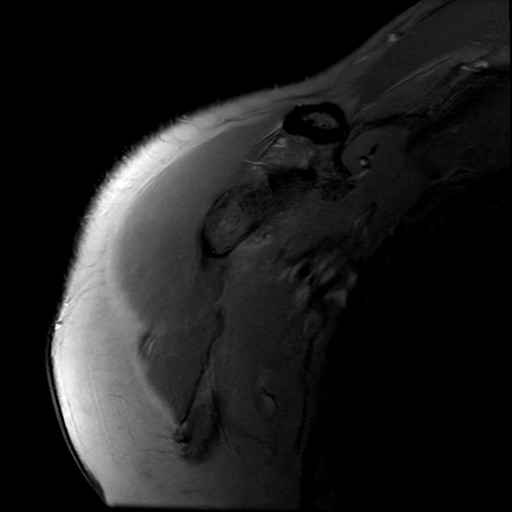
[im 5/14]
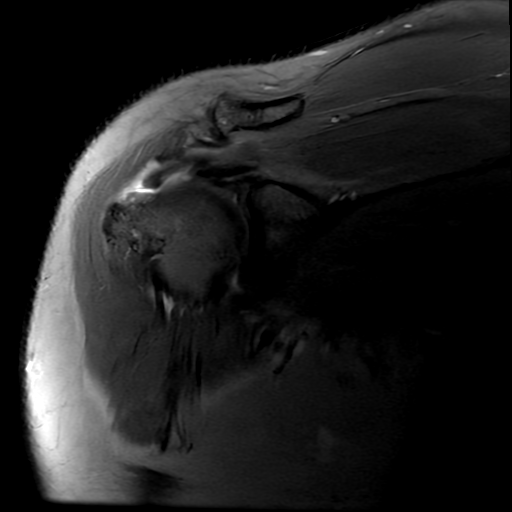
[im 9/14]
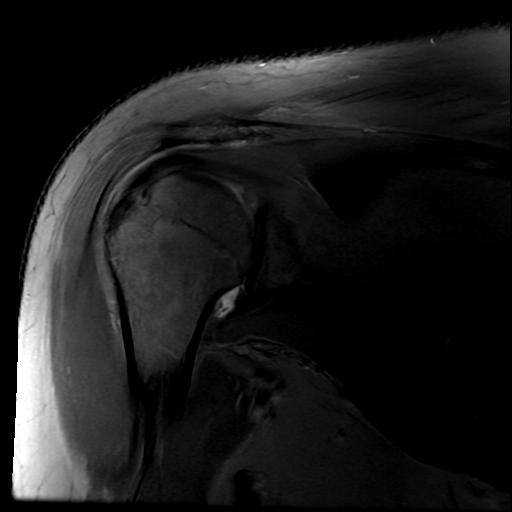
[im 14/14]
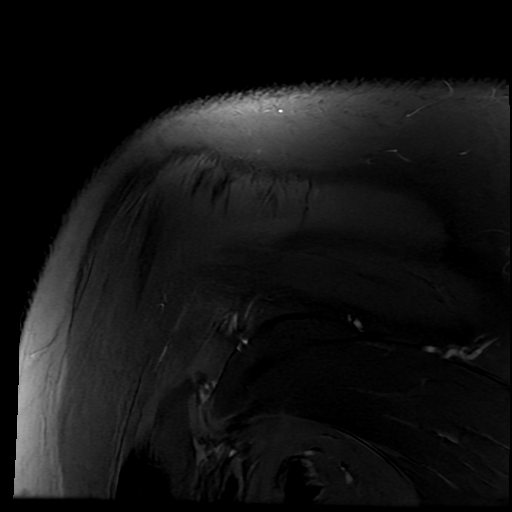

[Series 5: PD fat-sat · sagittal · 4.0mm · 0.27mm/px · 4 of 14 slices shown]
[im 1/14]
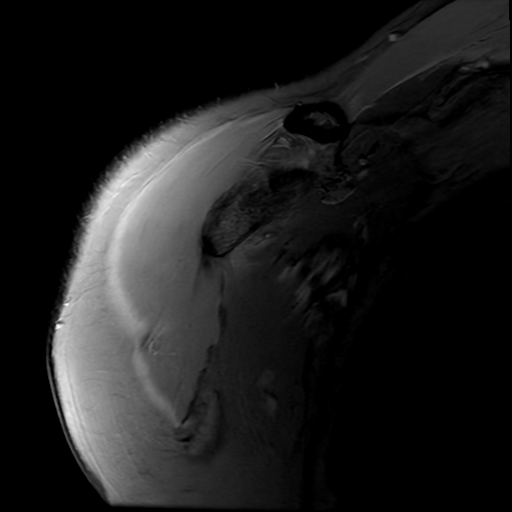
[im 5/14]
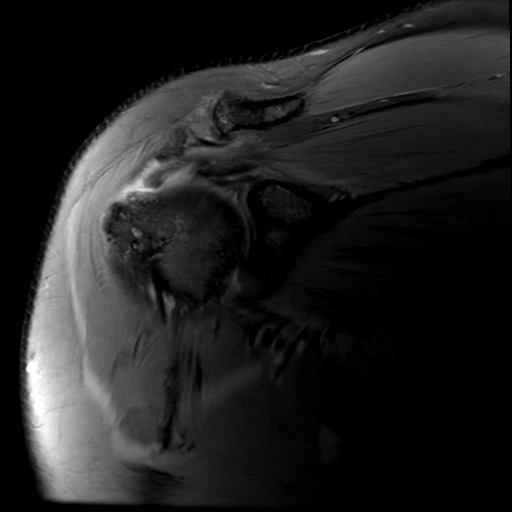
[im 9/14]
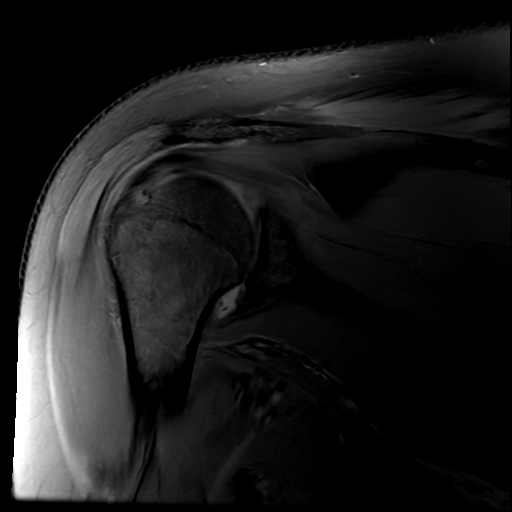
[im 14/14]
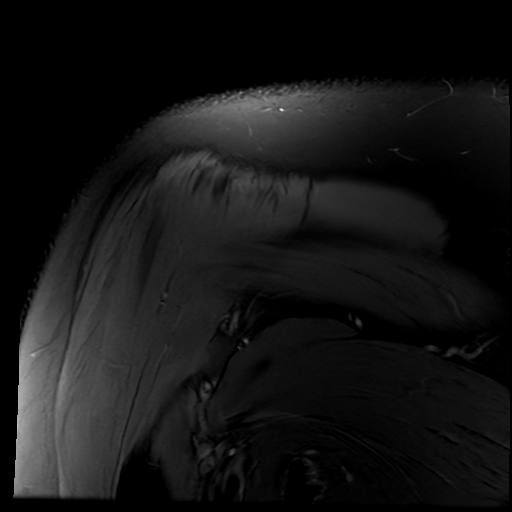

[Series 6: T2 fat-sat · coronal · 4.0mm · 0.31mm/px · 4 of 17 slices shown (3 of 4)]
[im 1/17]
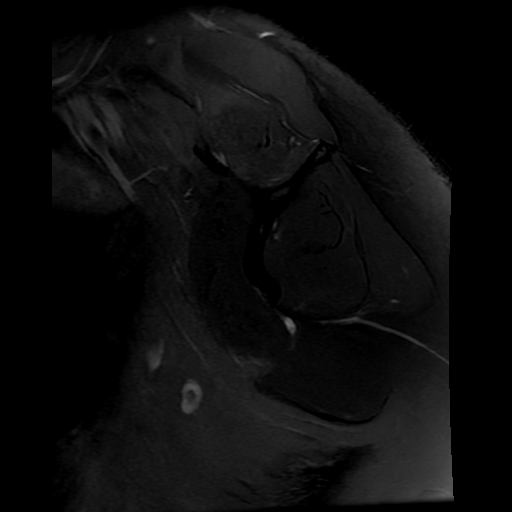
[im 6/17]
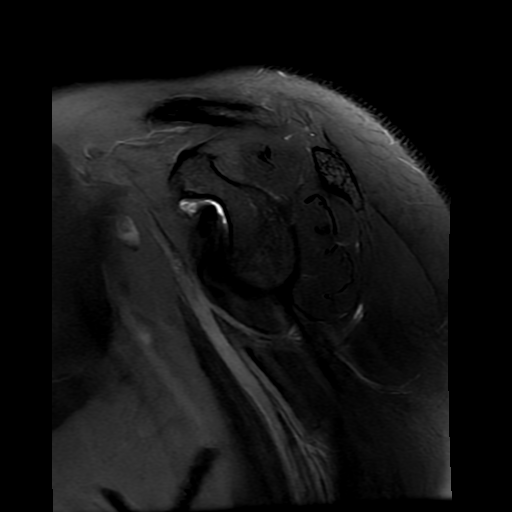
[im 11/17]
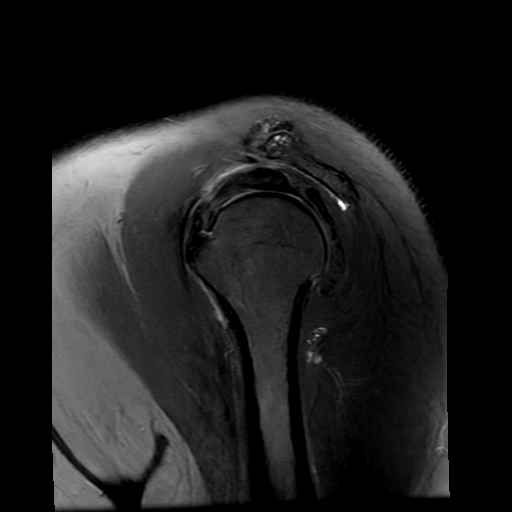
[im 17/17]
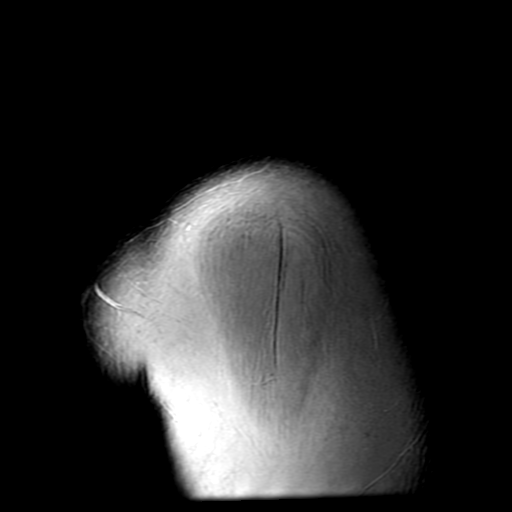

[Series 8: T2 fat-sat · axial · 4.0mm · 0.27mm/px · 1 of 21 slices shown (4 of 4)]
[im 1/21]
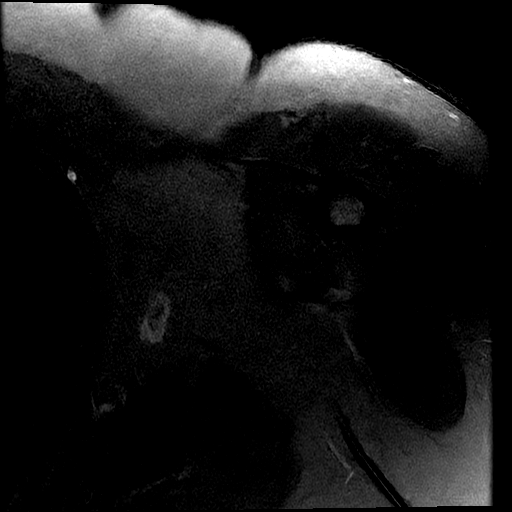

[19 of 40 positions shown; findings below may reference images not displayed]

FINDINGS: Rotator cuff: Focal full-thickness minimally retracted tear of the
anterior supraspinatus tendon insertion measuring approximately 8 mm
in AP dimension. Additional high-grade interstitial tear extending
to the articular surface of the supraspinatus tendon in the region
of the critical zone. Mild supraspinatus and infraspinatus
tendinosis. Subscapularis tendon appears slightly attenuated. Intact
teres minor.

Muscles: Preserved bulk and signal intensity of the rotator cuff
musculature without edema, atrophy, or fatty infiltration.

Biceps long head:  Intra-articular biceps tendinosis.

Acromioclavicular Joint: Moderate arthropathy of the AC joint. Small
volume subacromial-subdeltoid bursal fluid.

Glenohumeral Joint: Mild chondral thinning and surface irregularity.
Trace glenohumeral joint effusion. Small amount of debris seen
within the axillary pouch. Borderline thickening of the anterior
band of the inferior glenohumeral ligament. No periligamentous
edema.

Labrum: Grossly intact, but evaluation is limited by lack of
intraarticular contrast.

Bones:  No marrow abnormality, fracture or dislocation.

Other: None.
IMPRESSION: 1. Focal full-thickness minimally retracted tear of the anterior
supraspinatus tendon insertion. Additional high-grade interstitial
tear extending to the articular surface of the supraspinatus tendon
in the region of the critical zone.
2. Intra-articular biceps tendinosis.
3. Mild glenohumeral and moderate AC joint osteoarthritis.

## 2022-05-25 ENCOUNTER — Ambulatory Visit (INDEPENDENT_AMBULATORY_CARE_PROVIDER_SITE_OTHER): Payer: 59 | Admitting: Registered Nurse

## 2022-05-25 DIAGNOSIS — Z111 Encounter for screening for respiratory tuberculosis: Secondary | ICD-10-CM

## 2022-05-25 NOTE — Progress Notes (Signed)
Pt presented for TB skin testing today placed with normal wheel appreciated at placement. Pt advised to return within 48-72 hours

## 2022-05-27 ENCOUNTER — Ambulatory Visit: Payer: 59

## 2022-05-27 LAB — TB SKIN TEST
Induration: NEGATIVE mm
TB Skin Test: NEGATIVE

## 2022-05-29 ENCOUNTER — Other Ambulatory Visit: Payer: Self-pay | Admitting: Registered Nurse

## 2022-05-29 DIAGNOSIS — I10 Essential (primary) hypertension: Secondary | ICD-10-CM

## 2022-05-29 DIAGNOSIS — E785 Hyperlipidemia, unspecified: Secondary | ICD-10-CM

## 2022-05-30 ENCOUNTER — Other Ambulatory Visit: Payer: Self-pay | Admitting: Registered Nurse

## 2022-05-30 DIAGNOSIS — I10 Essential (primary) hypertension: Secondary | ICD-10-CM

## 2022-06-02 ENCOUNTER — Ambulatory Visit (INDEPENDENT_AMBULATORY_CARE_PROVIDER_SITE_OTHER): Payer: 59 | Admitting: Registered Nurse

## 2022-06-02 ENCOUNTER — Other Ambulatory Visit: Payer: Self-pay

## 2022-06-02 ENCOUNTER — Other Ambulatory Visit: Payer: Self-pay | Admitting: Registered Nurse

## 2022-06-02 ENCOUNTER — Encounter: Payer: Self-pay | Admitting: Registered Nurse

## 2022-06-02 VITALS — BP 130/74 | HR 86 | Temp 98.0°F | Resp 18 | Ht 63.0 in | Wt 156.2 lb

## 2022-06-02 DIAGNOSIS — I10 Essential (primary) hypertension: Secondary | ICD-10-CM

## 2022-06-02 DIAGNOSIS — K219 Gastro-esophageal reflux disease without esophagitis: Secondary | ICD-10-CM | POA: Diagnosis not present

## 2022-06-02 DIAGNOSIS — Z794 Long term (current) use of insulin: Secondary | ICD-10-CM

## 2022-06-02 DIAGNOSIS — E1121 Type 2 diabetes mellitus with diabetic nephropathy: Secondary | ICD-10-CM

## 2022-06-02 DIAGNOSIS — E119 Type 2 diabetes mellitus without complications: Secondary | ICD-10-CM | POA: Diagnosis not present

## 2022-06-02 LAB — CBC WITH DIFFERENTIAL/PLATELET
Basophils Absolute: 0 10*3/uL (ref 0.0–0.1)
Basophils Relative: 0.6 % (ref 0.0–3.0)
Eosinophils Absolute: 0.1 10*3/uL (ref 0.0–0.7)
Eosinophils Relative: 1.2 % (ref 0.0–5.0)
HCT: 40.2 % (ref 36.0–46.0)
Hemoglobin: 13 g/dL (ref 12.0–15.0)
Lymphocytes Relative: 37.7 % (ref 12.0–46.0)
Lymphs Abs: 2.4 10*3/uL (ref 0.7–4.0)
MCHC: 32.4 g/dL (ref 30.0–36.0)
MCV: 78.2 fl (ref 78.0–100.0)
Monocytes Absolute: 0.5 10*3/uL (ref 0.1–1.0)
Monocytes Relative: 7.1 % (ref 3.0–12.0)
Neutro Abs: 3.4 10*3/uL (ref 1.4–7.7)
Neutrophils Relative %: 53.4 % (ref 43.0–77.0)
Platelets: 234 10*3/uL (ref 150.0–400.0)
RBC: 5.14 Mil/uL — ABNORMAL HIGH (ref 3.87–5.11)
RDW: 13.8 % (ref 11.5–15.5)
WBC: 6.4 10*3/uL (ref 4.0–10.5)

## 2022-06-02 LAB — POCT GLYCOSYLATED HEMOGLOBIN (HGB A1C): Hemoglobin A1C: 13.7 % — AB (ref 4.0–5.6)

## 2022-06-02 LAB — LIPID PANEL
Cholesterol: 178 mg/dL (ref 0–200)
HDL: 36.3 mg/dL — ABNORMAL LOW (ref >39.00)
LDL Cholesterol: 120 mg/dL — ABNORMAL HIGH (ref 0–99)
NonHDL: 141.38
Total CHOL/HDL Ratio: 5
Triglycerides: 106 mg/dL (ref 0.0–149.0)
VLDL: 21.2 mg/dL (ref 0.0–40.0)

## 2022-06-02 LAB — COMPREHENSIVE METABOLIC PANEL
ALT: 18 U/L (ref 0–35)
AST: 17 U/L (ref 0–37)
Albumin: 4.5 g/dL (ref 3.5–5.2)
Alkaline Phosphatase: 83 U/L (ref 39–117)
BUN: 15 mg/dL (ref 6–23)
CO2: 26 mEq/L (ref 19–32)
Calcium: 10 mg/dL (ref 8.4–10.5)
Chloride: 99 mEq/L (ref 96–112)
Creatinine, Ser: 1.18 mg/dL (ref 0.40–1.20)
GFR: 53.31 mL/min — ABNORMAL LOW (ref >60.00)
Glucose, Bld: 215 mg/dL — ABNORMAL HIGH (ref 70–99)
Potassium: 3.6 mEq/L (ref 3.5–5.1)
Sodium: 136 mEq/L (ref 135–145)
Total Bilirubin: 0.4 mg/dL (ref 0.2–1.2)
Total Protein: 7.8 g/dL (ref 6.0–8.3)

## 2022-06-02 LAB — TSH: TSH: 1.34 u[IU]/mL (ref 0.35–5.50)

## 2022-06-02 MED ORDER — AMLODIPINE BESYLATE 10 MG PO TABS: 10.0000 mg | ORAL_TABLET | Freq: Every day | ORAL | 0 refills | Status: DC

## 2022-06-02 MED ORDER — INSULIN SYRINGES (DISPOSABLE) U-100 0.5 ML MISC: 3 refills | Status: DC

## 2022-06-02 MED ORDER — INSULIN GLARGINE-YFGN 100 UNIT/ML ~~LOC~~ SOLN
30.0000 [IU] | Freq: Every day | SUBCUTANEOUS | 3 refills | Status: DC
Start: 2022-06-02 — End: 2022-08-25

## 2022-06-02 MED ORDER — METOPROLOL SUCCINATE ER 100 MG PO TB24: ORAL_TABLET | ORAL | 0 refills | Status: DC

## 2022-06-02 MED ORDER — METFORMIN HCL ER 500 MG PO TB24
500.0000 mg | ORAL_TABLET | Freq: Two times a day (BID) | ORAL | 3 refills | Status: DC
Start: 2022-06-02 — End: 2022-08-25

## 2022-06-02 MED ORDER — NOVOLOG FLEXPEN 100 UNIT/ML ~~LOC~~ SOPN: 6.0000 [IU] | PEN_INJECTOR | Freq: Three times a day (TID) | SUBCUTANEOUS | 11 refills | Status: DC

## 2022-06-02 MED ORDER — ATORVASTATIN CALCIUM 20 MG PO TABS: 20.0000 mg | ORAL_TABLET | Freq: Every day | ORAL | 0 refills | Status: DC

## 2022-06-02 MED ORDER — INSULIN PEN NEEDLE 32G X 4 MM MISC: 1.0000 | Freq: Four times a day (QID) | 3 refills | Status: DC

## 2022-06-02 MED ORDER — OMEPRAZOLE 20 MG PO CPDR: 20.0000 mg | DELAYED_RELEASE_CAPSULE | Freq: Two times a day (BID) | ORAL | 1 refills | Status: DC

## 2022-06-02 MED ORDER — SEMAGLUTIDE (2 MG/DOSE) 8 MG/3ML ~~LOC~~ SOPN: 2.0000 mg | PEN_INJECTOR | SUBCUTANEOUS | 3 refills | Status: DC

## 2022-06-02 MED ORDER — IRBESARTAN 150 MG PO TABS: 150.0000 mg | ORAL_TABLET | Freq: Every day | ORAL | 0 refills | Status: DC

## 2022-06-02 MED ORDER — CHLORTHALIDONE 25 MG PO TABS: 25.0000 mg | ORAL_TABLET | Freq: Every day | ORAL | 0 refills | Status: DC

## 2022-06-02 MED ORDER — SPIRONOLACTONE 50 MG PO TABS: 50.0000 mg | ORAL_TABLET | Freq: Every day | ORAL | 1 refills | Status: DC

## 2022-06-02 NOTE — Progress Notes (Signed)
Established Patient Office Visit  Subjective:  Patient ID: Yolanda Herrera, female    DOB: [birth year]  Age: 51 y.o. MRN: [id number]  CC:  Chief Complaint  Patient presents with   Follow-up    Patient states she is here for hypertension and medication refill.    HPI Yolanda Herrera presents for htn, t2dm  Hypertension: Patient Currently taking:  Good effect. No AEs. Denies CV symptoms including: chest pain, shob, doe, headache, visual changes, fatigue, claudication, and dependent edema.   Previous readings and labs: BP Readings from Last 3 Encounters:  06/02/22 130/74  11/19/21 130/80  08/19/21 132/80   Lab Results  Component Value Date   CREATININE 0.94 07/28/2021    T2dm Last A1c:  Lab Results  Component Value Date   HGBA1C 13.7 (A) 06/02/2022    Currently taking: semglee yfgn 30 units nightly, novolog 6 units preprandial, metformin 559m XR po bid ac, semaglutide 254msubq weekly. No new complications Reports good compliance with medications Diet has been healthy. Small portions. Mostly plants Exercise habits have been steady   Outpatient Medications Prior to Visit  Medication Sig Dispense Refill   ACCU-CHEK GUIDE test strip USE UP TO THREE TIMES DAILY     blood glucose meter kit and supplies per insurance preference. Use up to three times daily as directed. E1.9, Z79.4 Please dispense lancets and glucose test stripes to match glucometer, 100 each 1 each 11   PEG-KCl-NaCl-NaSulf-Na Asc-C (PLENVU) 140 g SOLR Use as directed. Manufacturer's coupon Universal coupon code:BIN: [id number]: : [id number]: [id number]; ID: : [id number] NO MORE $50; NO prior authorization 1 each 0   potassium chloride (KLOR-CON) 10 MEQ tablet Take 1 tablet (10 mEq total) by mouth 2 (two) times daily. 180 tablet 0   amLODipine (NORVASC) 10 MG tablet TAKE 1 TABLET BY MOUTH ONCE DAILY . APPOINTMENT REQUIRED FOR FUTURE REFILLS 10 tablet 0   atorvastatin (LIPITOR) 20 MG tablet Take 1  tablet by mouth once daily 10 tablet 0   chlorthalidone (HYGROTON) 25 MG tablet Take 1 tablet by mouth once daily 90 tablet 0   insulin aspart (NOVOLOG FLEXPEN) 100 UNIT/ML FlexPen Inject 6 Units into the skin 3 (three) times daily with meals. 15 mL 11   insulin glargine-yfgn (SEMGLEE, YFGN,) 100 UNIT/ML injection Inject 0.3 mLs (30 Units total) into the skin daily. 30 mL 3   Insulin Pen Needle 32G X 4 MM MISC 1 Device by Does not apply route in the morning, at noon, in the evening, and at bedtime. 400 each 3   Insulin Syringes, Disposable, U-100 0.5 ML MISC Use twice with a day with NPH. Dx E11.65, Z79.4 100 each 3   irbesartan (AVAPRO) 150 MG tablet Take 1 tablet by mouth once daily 10 tablet 0   metFORMIN (GLUCOPHAGE XR) 500 MG 24 hr tablet Take 1 tablet (500 mg total) by mouth in the morning and at bedtime. 180 tablet 3   metoprolol succinate (TOPROL-XL) 100 MG 24 hr tablet TAKE 1 TABLET BY MOUTH ONCE DAILY.TAKE WITH OR IMMEDIATELY FOLLOWING A MEAL. 10 tablet 0   omeprazole (PRILOSEC) 20 MG capsule Take 1 capsule (20 mg total) by mouth 2 (two) times daily before a meal. 180 capsule 1   Semaglutide, 2 MG/DOSE, 8 MG/3ML SOPN Inject 2 mg as directed once a week. 9 mL 3   spironolactone (ALDACTONE) 50 MG tablet Take 1 tablet (50 mg total) by mouth daily. 90 tablet 1   Continuous Blood Gluc Sensor (FREESTYLE LIBRE 2  SENSOR) MISC 1 Device by Does not apply route every 14 (fourteen) days. (Patient not taking: Reported on 11/19/2021) 2 each 11   No facility-administered medications prior to visit.    Review of Systems  Constitutional: Negative.   HENT: Negative.    Eyes: Negative.   Respiratory: Negative.    Cardiovascular: Negative.   Gastrointestinal: Negative.   Genitourinary: Negative.   Musculoskeletal: Negative.   Skin: Negative.   Neurological: Negative.   Psychiatric/Behavioral: Negative.    All other systems reviewed and are negative.     Objective:     BP 130/74   Pulse 86    Temp 98 F (36.7 C) (Temporal)   Resp 18   Ht 5' 3"  (1.6 m)   Wt 156 lb 3.2 oz (70.9 kg)   LMP 01/18/2018 (Approximate)   SpO2 99%   BMI 27.67 kg/m   Wt Readings from Last 3 Encounters:  06/02/22 156 lb 3.2 oz (70.9 kg)  11/19/21 150 lb 9.6 oz (68.3 kg)  08/19/21 156 lb (70.8 kg)   Physical Exam Vitals and nursing note reviewed.  Constitutional:      General: She is not in acute distress.    Appearance: Normal appearance. She is normal weight. She is not ill-appearing, toxic-appearing or diaphoretic.  Cardiovascular:     Rate and Rhythm: Normal rate and regular rhythm.     Heart sounds: Normal heart sounds. No murmur heard.    No friction rub. No gallop.  Pulmonary:     Effort: Pulmonary effort is normal. No respiratory distress.     Breath sounds: Normal breath sounds. No stridor. No wheezing, rhonchi or rales.  Chest:     Chest wall: No tenderness.  Skin:    General: Skin is warm and dry.  Neurological:     General: No focal deficit present.     Mental Status: She is alert and oriented to person, place, and time. Mental status is at baseline.  Psychiatric:        Mood and Affect: Mood normal.        Behavior: Behavior normal.        Thought Content: Thought content normal.        Judgment: Judgment normal.     Results for orders placed or performed in visit on 06/02/22  POCT glycosylated hemoglobin (Hb A1C)  Result Value Ref Range   Hemoglobin A1C 13.7 (A) 4.0 - 5.6 %   HbA1c POC (<> result, manual entry)     HbA1c, POC (prediabetic range)     HbA1c, POC (controlled diabetic range)        The 10-year ASCVD risk score (Arnett DK, et al., 2019) is: 11.2%    Assessment & Plan:   Problem List Items Addressed This Visit       Cardiovascular and Mediastinum   Essential hypertension    Well controlled on current regimen. Continue. Labs collected. Will follow up with the patient as warranted.       Relevant Medications   amLODipine (NORVASC) 10 MG  tablet   atorvastatin (LIPITOR) 20 MG tablet   chlorthalidone (HYGROTON) 25 MG tablet   irbesartan (AVAPRO) 150 MG tablet   spironolactone (ALDACTONE) 50 MG tablet   insulin aspart (NOVOLOG FLEXPEN) 100 UNIT/ML FlexPen   insulin glargine-yfgn (SEMGLEE, YFGN,) 100 UNIT/ML injection   Insulin Pen Needle 32G X 4 MM MISC   Insulin Syringes, Disposable, U-100 0.5 ML MISC   metFORMIN (GLUCOPHAGE XR) 500 MG 24 hr tablet  metoprolol succinate (TOPROL-XL) 100 MG 24 hr tablet   Semaglutide, 2 MG/DOSE, 8 MG/3ML SOPN   Other Relevant Orders   Comprehensive metabolic panel   CBC with Differential/Platelet   TSH   Lipid panel     Digestive   Gastroesophageal reflux disease    Continue healthy diet and PPI      Relevant Medications   omeprazole (PRILOSEC) 20 MG capsule     Endocrine   Type 2 diabetes mellitus without complication, with long-term current use of insulin (HCC) - Primary    Titrate up semglee 3 units every 5 days until at 48 units or glucose control - whichever first. Follow up with endo as soon as possible. Establish with new PCP within 3 months. Maintain other meds. Labs collected. Will follow up with the patient as warranted.       Relevant Medications   atorvastatin (LIPITOR) 20 MG tablet   irbesartan (AVAPRO) 150 MG tablet   insulin aspart (NOVOLOG FLEXPEN) 100 UNIT/ML FlexPen   insulin glargine-yfgn (SEMGLEE, YFGN,) 100 UNIT/ML injection   metFORMIN (GLUCOPHAGE XR) 500 MG 24 hr tablet   Semaglutide, 2 MG/DOSE, 8 MG/3ML SOPN   Other Relevant Orders   POCT glycosylated hemoglobin (Hb A1C) (Completed)   Comprehensive metabolic panel   CBC with Differential/Platelet   TSH   Lipid panel    Meds ordered this encounter  Medications   amLODipine (NORVASC) 10 MG tablet    Sig: Take 1 tablet (10 mg total) by mouth daily.    Dispense:  90 tablet    Refill:  0    Order Specific Question:   Supervising Provider    Answer:   Carlota Raspberry, JEFFREY R [[id number]]   atorvastatin  (LIPITOR) 20 MG tablet    Sig: Take 1 tablet (20 mg total) by mouth daily.    Dispense:  90 tablet    Refill:  0    Order Specific Question:   Supervising Provider    Answer:   Carlota Raspberry, JEFFREY R [[id number]]   chlorthalidone (HYGROTON) 25 MG tablet    Sig: Take 1 tablet (25 mg total) by mouth daily.    Dispense:  90 tablet    Refill:  0    Order Specific Question:   Supervising Provider    Answer:   Carlota Raspberry, JEFFREY R [[id number]]   irbesartan (AVAPRO) 150 MG tablet    Sig: Take 1 tablet (150 mg total) by mouth daily.    Dispense:  90 tablet    Refill:  0    Order Specific Question:   Supervising Provider    Answer:   Carlota Raspberry, JEFFREY R [[id number]]   spironolactone (ALDACTONE) 50 MG tablet    Sig: Take 1 tablet (50 mg total) by mouth daily.    Dispense:  90 tablet    Refill:  1    Order Specific Question:   Supervising Provider    Answer:   Carlota Raspberry, JEFFREY R [[id number]]   insulin aspart (NOVOLOG FLEXPEN) 100 UNIT/ML FlexPen    Sig: Inject 6 Units into the skin 3 (three) times daily with meals.    Dispense:  15 mL    Refill:  11    Order Specific Question:   Supervising Provider    Answer:   Carlota Raspberry, JEFFREY R [[id number]]   insulin glargine-yfgn (SEMGLEE, YFGN,) 100 UNIT/ML injection    Sig: Inject 0.3 mLs (30 Units total) into the skin daily.    Dispense:  30 mL    Refill:  3  Insulin pens please    Order Specific Question:   Supervising Provider    Answer:   Carlota Raspberry, JEFFREY R [[id number]]   Insulin Pen Needle 32G X 4 MM MISC    Sig: 1 Device by Does not apply route in the morning, at noon, in the evening, and at bedtime.    Dispense:  400 each    Refill:  3    Order Specific Question:   Supervising Provider    Answer:   Carlota Raspberry, JEFFREY R [[id number]]   Insulin Syringes, Disposable, U-100 0.5 ML MISC    Sig: Use twice with a day with NPH. Dx E11.65, Z79.4    Dispense:  100 each    Refill:  3    Order Specific Question:   Supervising Provider    Answer:   Carlota Raspberry, JEFFREY R [[id number]]   metFORMIN (GLUCOPHAGE XR)  500 MG 24 hr tablet    Sig: Take 1 tablet (500 mg total) by mouth in the morning and at bedtime.    Dispense:  180 tablet    Refill:  3    Order Specific Question:   Supervising Provider    Answer:   Carlota Raspberry, JEFFREY R [[id number]]   metoprolol succinate (TOPROL-XL) 100 MG 24 hr tablet    Sig: TAKE 1 TABLET BY MOUTH ONCE DAILY.TAKE WITH OR IMMEDIATELY FOLLOWING A MEAL.    Dispense:  90 tablet    Refill:  0    Order Specific Question:   Supervising Provider    Answer:   Carlota Raspberry, JEFFREY R [[id number]]   omeprazole (PRILOSEC) 20 MG capsule    Sig: Take 1 capsule (20 mg total) by mouth 2 (two) times daily before a meal.    Dispense:  180 capsule    Refill:  1    Order Specific Question:   Supervising Provider    Answer:   Carlota Raspberry, JEFFREY R [[id number]]   Semaglutide, 2 MG/DOSE, 8 MG/3ML SOPN    Sig: Inject 2 mg as directed once a week.    Dispense:  9 mL    Refill:  3    Order Specific Question:   Supervising Provider    Answer:   Carlota Raspberry, JEFFREY R [[id number]]    Return if symptoms worsen or fail to improve.    Maximiano Coss, NP

## 2022-06-02 NOTE — Assessment & Plan Note (Signed)
Continue healthy diet and PPI

## 2022-06-02 NOTE — Patient Instructions (Addendum)
Ms. Nadelyn Enriques to see you!  Let's get the sugars down.   Increase semglee by 3 units every 5 days until sugars upon waking are between 90 and 130. Do not go higher than 48 units without consulting with endocrinology. See them at your earliest convenience.  I recommend these primary care providers: Abbe Amsterdam, MD Jarold Motto, [place] Jacquiline Doe, MD Edwina Barth, MD Letta Moynahan Early, NP Jiles Prows, DNP Glenetta Hew, MD  Thank you for letting me take part in your care,  Rich   If you have lab work done today you will be contacted with your lab results within the next 2 weeks.  If you have not heard from [place] then please contact us. The fastest way to get your results is to register for My Chart.   IF you received an x-ray today, you will receive an invoice from [facility]. Please contact [facility] at [phone] with questions or concerns regarding your invoice.   IF you received labwork today, you will receive an invoice from [place]. Please contact [facility] at [phone] with questions or concerns regarding your invoice.   Our billing staff will not be able to assist you with questions regarding bills from these companies.  You will be contacted with the lab results as soon as they are available. The fastest way to get your results is to activate your My Chart account. Instructions are located on the last page of this paperwork. If you have not heard from [place] regarding the results in 2 weeks, please contact this office.

## 2022-06-02 NOTE — Assessment & Plan Note (Signed)
Well controlled on current regimen. Continue. Labs collected. Will follow up with the patient as warranted.

## 2022-06-02 NOTE — Progress Notes (Signed)
If we could call patient - I am placing referral to nephrology. Her kidneys are starting to struggle a bit due to her diabetes. She needs to buckle down on medication compliance and healthy food choices.

## 2022-06-02 NOTE — Assessment & Plan Note (Signed)
Titrate up semglee 3 units every 5 days until at 48 units or glucose control - whichever first. Follow up with endo as soon as possible. Establish with new PCP within 3 months. Maintain other meds. Labs collected. Will follow up with the patient as warranted.

## 2022-06-16 ENCOUNTER — Telehealth: Payer: Self-pay

## 2022-06-16 NOTE — Telephone Encounter (Signed)
Will do!

## 2022-06-16 NOTE — Telephone Encounter (Signed)
Please send patients most recent lab results to [facility] so they can process her referral and get her scheduled

## 2022-08-25 ENCOUNTER — Ambulatory Visit (INDEPENDENT_AMBULATORY_CARE_PROVIDER_SITE_OTHER): Payer: Commercial Managed Care - HMO | Admitting: Internal Medicine

## 2022-08-25 ENCOUNTER — Telehealth: Payer: Self-pay

## 2022-08-25 ENCOUNTER — Encounter: Payer: Self-pay | Admitting: Internal Medicine

## 2022-08-25 ENCOUNTER — Other Ambulatory Visit (HOSPITAL_COMMUNITY): Payer: Self-pay

## 2022-08-25 VITALS — BP 128/80 | HR 60 | Ht 63.0 in | Wt 156.0 lb

## 2022-08-25 DIAGNOSIS — E1165 Type 2 diabetes mellitus with hyperglycemia: Secondary | ICD-10-CM

## 2022-08-25 DIAGNOSIS — I10 Essential (primary) hypertension: Secondary | ICD-10-CM

## 2022-08-25 DIAGNOSIS — E269 Hyperaldosteronism, unspecified: Secondary | ICD-10-CM

## 2022-08-25 DIAGNOSIS — Z794 Long term (current) use of insulin: Secondary | ICD-10-CM

## 2022-08-25 DIAGNOSIS — E11319 Type 2 diabetes mellitus with unspecified diabetic retinopathy without macular edema: Secondary | ICD-10-CM

## 2022-08-25 LAB — POCT GLYCOSYLATED HEMOGLOBIN (HGB A1C): Hemoglobin A1C: 9.8 % — AB (ref 4.0–5.6)

## 2022-08-25 LAB — POCT GLUCOSE (DEVICE FOR HOME USE): Glucose Fasting, POC: 290 mg/dL — AB (ref 70–99)

## 2022-08-25 MED ORDER — OMNIPOD 5 DEXG7G6 INTRO GEN 5 KIT: 1.0000 | PACK | 0 refills | Status: DC

## 2022-08-25 MED ORDER — DEXCOM G6 SENSOR MISC: 1.0000 | 3 refills | Status: DC

## 2022-08-25 MED ORDER — INSULIN GLARGINE-YFGN 100 UNIT/ML ~~LOC~~ SOLN: 40.0000 [IU] | Freq: Every day | SUBCUTANEOUS | 3 refills | Status: DC

## 2022-08-25 MED ORDER — DEXCOM G6 TRANSMITTER MISC: 1.0000 | 3 refills | Status: DC

## 2022-08-25 MED ORDER — SPIRONOLACTONE 100 MG PO TABS: 100.0000 mg | ORAL_TABLET | Freq: Every day | ORAL | 3 refills | Status: DC

## 2022-08-25 MED ORDER — METFORMIN HCL ER 500 MG PO TB24: 500.0000 mg | ORAL_TABLET | Freq: Two times a day (BID) | ORAL | 3 refills | Status: DC

## 2022-08-25 MED ORDER — ONETOUCH VERIO VI STRP: 1.0000 | ORAL_STRIP | Freq: Three times a day (TID) | 3 refills | Status: AC

## 2022-08-25 MED ORDER — NOVOLOG FLEXPEN 100 UNIT/ML ~~LOC~~ SOPN: 10.0000 [IU] | PEN_INJECTOR | Freq: Three times a day (TID) | SUBCUTANEOUS | 3 refills | Status: DC

## 2022-08-25 MED ORDER — INSULIN LISPRO (1 UNIT DIAL) 100 UNIT/ML (KWIKPEN): 10.0000 [IU] | PEN_INJECTOR | Freq: Three times a day (TID) | SUBCUTANEOUS | 3 refills | Status: DC

## 2022-08-25 MED ORDER — SEMAGLUTIDE (2 MG/DOSE) 8 MG/3ML ~~LOC~~ SOPN: 2.0000 mg | PEN_INJECTOR | SUBCUTANEOUS | 3 refills | Status: DC

## 2022-08-25 MED ORDER — OMNIPOD 5 DEXG7G6 PODS GEN 5 MISC: 1.0000 | 3 refills | Status: DC

## 2022-08-25 NOTE — Progress Notes (Signed)
Name: Yolanda Herrera  Age/ Sex: 51 y.o., female   MRN/ DOB: [id number], October 10, 1971     PCP: Maximiano Coss, NP   Reason for Endocrinology Evaluation: Type 2 Diabetes Mellitus  Initial Endocrine Consultative Visit: 01/11/2020    PATIENT IDENTIFIER: [name] is a 51 y.o. female with a past medical history of DM and dyslipidemia. The patient has followed with Endocrinology clinic since 01/11/2020 for consultative assistance with management of her diabetes.  DIABETIC HISTORY:  Yolanda Herrera was diagnosed with DM in 2011.  She has been on Metformin since her diagnosis, as well as glimepiride.  Yolanda Herrera was cost prohibitive.  Ozempic was started in 12/2019.  Her hemoglobin A1c has ranged from 9.2% in 2019, peaking at 14.9% in 2021.   On her initial visit to our clinic, her A1c was 14.9%   , she was on Metformin, glimepiride, Ozempic, and NPH.  We stopped  glimepiride and NPH, we increase Metformin, continue to Ozempic and started Lantus.   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   Dexamethasone suppression test normal 08/2021   She was evaluated by GI for pancreatic cyst 07/2021     SUBJECTIVE:   During the last visit (11/19/2021): A1c 14.6 %    Today (08/25/2022): Yolanda Herrera is here for follow-up on diabetes management. She has not been checking glucose at home because her monitor broke.  . The patient has not had hypoglycemic episodes since  the last clinic visit  Denies nausea or diarrhea but has chronic constipation     HOME DIABETES REGIMEN:  Semglee 34 units daily  Novolog 6 units with each meal  Metformin 500 mg, 1 tabs  BID Ozempic 2 mg weekly  Avapro 150 mg daily Metoprolol 100 mg daily Amlodipine 10 mg daily spironolactone 50 mg daily Chlorthalidone 25 mg daily     Statin: Yes ACE-I/ARB: Yes   METER DOWNLOAD SUMMARY: Did not bring  BG > 160 mg/dL    DIABETIC COMPLICATIONS: Microvascular complications:  Retinopathy ? Right,  Denies: neuropathy  Last eye exam: Completed 12/2019   Macrovascular complications:    Denies: CAD, PVD, CVA  HISTORY:  Past Medical History:  Past Medical History:  Diagnosis Date   Diabetes mellitus without complication ([place])    Hyperlipidemia    Hypertension    Past Surgical History:  Past Surgical History:  Procedure Laterality Date   CESAREAN SECTION     UTERINE FIBROID SURGERY     Social History:  reports that she has never smoked. She has never used smokeless tobacco. She reports that she does not drink alcohol and does not use drugs. Family History:  Family History  Problem Relation Age of Onset   Diabetes Mother    Diabetes Father    Kidney disease Father    Hypertension Father    Hypertension Sister    Stroke Sister      HOME MEDICATIONS: Allergies as of  08/25/2022   No Known Allergies      Medication List        Accurate as of August 25, 2022  1:42 PM. If you have any questions, ask your nurse or doctor.          STOP taking these medications    chlorthalidone 25 MG tablet Commonly known as: HYGROTON Stopped by: Dorita Sciara, MD   NovoLOG FlexPen 100 UNIT/ML FlexPen Generic drug: insulin aspart Stopped by: Dorita Sciara, MD   potassium chloride 10 MEQ tablet Commonly known as: KLOR-CON Stopped by: Dorita Sciara, MD       TAKE these medications    amLODipine 10 MG tablet Commonly known as:  NORVASC Take 1 tablet (10 mg total) by mouth daily.   atorvastatin 20 MG tablet Commonly known as: LIPITOR Take 1 tablet (20 mg total) by mouth daily.   blood glucose meter kit and supplies per insurance preference. Use up to three times daily as directed. E1.9, Z79.4 Please dispense lancets and glucose test stripes to match glucometer, 100 each   Dexcom G6 Sensor Misc 1 Device by Does not apply route as directed. Started by: Dorita Sciara, MD   Dexcom G6 Transmitter Misc 1 Device by Does not apply route as directed. Started by: Dorita Sciara, MD   insulin glargine-yfgn 100 UNIT/ML injection Commonly known as: Semglee (yfgn) Inject 0.4 mLs (40 Units total) into the skin daily. What changed: how much to take Changed by: Dorita Sciara, MD   insulin lispro 100 UNIT/ML KwikPen Commonly known as: HumaLOG KwikPen Inject 10 Units into the skin 3 (three) times daily. Started by: Dorita Sciara, MD   Insulin Pen Needle 32G X 4 MM Misc 1 Device by Does not apply route in the morning, at noon, in the evening, and at bedtime.   Insulin Syringes (Disposable) U-100 0.5 ML Misc Use twice with a day with NPH. Dx E11.65, Z79.4   irbesartan 150 MG tablet Commonly known as: AVAPRO Take 1 tablet (150 mg total) by mouth daily.   metFORMIN 500 MG 24 hr tablet Commonly known as: Glucophage XR Take 1 tablet (500 mg total) by mouth in the morning and at bedtime.   metoprolol succinate 100 MG 24 hr tablet Commonly known as: TOPROL-XL TAKE 1 TABLET BY MOUTH ONCE DAILY.TAKE WITH OR IMMEDIATELY FOLLOWING A MEAL.   omeprazole 20 MG capsule Commonly known as: PRILOSEC Take 1 capsule (20 mg total) by mouth 2 (two) times daily before a meal.   Omnipod 5 G6 Pod (Gen 5) Misc 1 Device by Does not apply route every other day. Started by: Dorita Sciara, MD   Omnipod 5 G6 Intro (Gen 5) Kit 1 Device by Does not apply route every other day. Started by: Dorita Sciara, MD   OneTouch Verio test strip Generic drug: glucose blood 1 each by Other route 3 (three) times daily. Use as instructed What changed: See the new instructions. Changed by: Dorita Sciara, MD   Plenvu 140 g Solr Generic drug: PEG-KCl-NaCl-NaSulf-Na Asc-C Use as directed. Manufacturer's coupon Universal coupon code:BIN: P2366821; GROUP: [id number]; PCN: CNRX; ID: [id number]; PAY NO MORE $50; NO prior authorization   Semaglutide (2 MG/DOSE) 8 MG/3ML Sopn Inject 2 mg as directed once a week.   spironolactone 100 MG tablet Commonly known as: ALDACTONE Take 1 tablet (100 mg total) by mouth daily. What changed:  medication strength how much to take Changed by: Dorita Sciara, MD  OBJECTIVE:   Vital Signs: BP 128/80 (BP Location: Left Arm, Patient Position: Sitting, Cuff Size: Small)   Pulse 60   Ht 5' 3"  (1.6 m)   Wt 156 lb (70.8 kg)   LMP 01/18/2018 (Approximate)   SpO2 98%   BMI 27.63 kg/m   Wt Readings from Last 3 Encounters:  08/25/22 156 lb (70.8 kg)  06/02/22 156 lb 3.2 oz (70.9 kg)  11/19/21 150 lb 9.6 oz (68.3 kg)     Exam: General: Pt appears well and is in NAD  Lungs: Clear with good BS bilat   Heart: RRR  Abdomen: Normoactive bowel sounds, soft, nontender, without masses or organomegaly palpable  Extremities: No pretibial edema. .  Neuro: MS is good with appropriate affect, pt is alert and Ox3    DM foot exam: 08/01/2020  The skin of the feet is intact without sores or ulcerations. The pedal pulses are 2+ on right and 2+ on left. The sensation is decreased  to a screening 5.07, 10 gram monofilament on the right    DATA REVIEWED:  Lab Results  Component Value Date   HGBA1C 9.8 (A) 08/25/2022   HGBA1C 13.7 (A) 06/02/2022   HGBA1C 14.6 (A) 11/19/2021     Latest Reference Range & Units 06/02/22 10:34  Sodium 135 - 145 mEq/L 136  Potassium 3.5 - 5.1 mEq/L 3.6  Chloride 96 - 112 mEq/L 99  CO2 19 - 32 mEq/L 26   Glucose 70 - 99 mg/dL 215 (H)  BUN 6 - 23 mg/dL 15  Creatinine 0.40 - 1.20 mg/dL 1.18  Calcium 8.4 - 10.5 mg/dL 10.0  Alkaline Phosphatase 39 - 117 U/L 83  Albumin 3.5 - 5.2 g/dL 4.5  AST 0 - 37 U/L 17  ALT 0 - 35 U/L 18  Total Protein 6.0 - 8.3 g/dL 7.8  Total Bilirubin 0.2 - 1.2 mg/dL 0.4  GFR >60.00 mL/min 53.31 (L)    CT abdomen 06/05/2021   IMPRESSION: 1. Bilateral adrenal glands appear normal. 2. Prominence of the intra and extrahepatic biliary tree with the common duct measuring 8 mm. Correlation with laboratory values is suggested to exclude biliary ductal obstruction. 3. Cystic 7 mm lesion in the body of the pancreas without suspicious soft tissue enhancement and no pancreatic ductal dilation. Favored to represent a side branch IPMN or pancreatic pseudocyst. Recommend follow up pre and post contrast MRI/MRCP or pancreatic protocol CT in 1 year. This recommendation follows ACR consensus guidelines: Management of Incidental Pancreatic Cysts: A White Paper of the ACR Incidental Findings Committee. J Am Coll Radiol 4540;98:119-147. 4. Right lower pole renal lesion measuring 9 mm with additional tiny bilateral hypodense renal lesions which are technically too small to accurately characterize but favored to represent cysts. 5.  Aortic Atherosclerosis (ICD10-I70.0).    In office BG 370 mg/dL  ASSESSMENT / PLAN / RECOMMENDATIONS:   1) Type 2 Diabetes Mellitus, poorly controlled, With retinopathic complications - Most recent A1c of  9.8  %. Goal A1c < 7.0 %.     -Poorly controlled diabetes  -In the past we discussed SGLT2 inhibitors but were cost prohibitive, we also discussed in the past pioglitazone but she was reluctant to start due to weight gain. - She did not take her basal insulin last night because she had a bruise at previous injection site ? We discussed she has other sites to inject, she has not been checking glucose and was encouraged to contact us for a new  prescription of a glucose  meter if needed in the future -She does not like to inject herself multiple times a day, I have offered an insulin pump as an alternative therapy -We did to prescribe the OmniPod and the Dexcom to her pharmacy and see what the cost is, we may also consider tandem pump if needed, she will notify me if she receives the OmniPod so I can put in a referral for CDE training -She was advised to take her prandial insulin before the meal rather than after the meal -We will increase insulin as below    MEDICATIONS:  -Increase Humalog 10 units with each meal - Increase Semglee 40  units daily  - Continue metformin 500 mg , 1 tab twice daily - Continue  Ozempic  2 mg weekly    EDUCATION / INSTRUCTIONS: BG monitoring instructions: Patient is instructed to check her blood sugars 1 times a day. Call [facility] if: BG persistently < 70  I reviewed the Rule of 15 for the treatment of hypoglycemia in detail with the patient. Literature supplied.    2) Diabetic complications:  Eye: Does have known diabetic retinopathy.  Neuro/ Feet: Does not have known diabetic peripheral neuropathy. Renal: Patient does not have known baseline CKD. She is  on an ACEI/ARB at present   3)Hyperaldosteronism:   -In review of records she has been noted to have a suppressed renin<0.167, aldosterone of 17.5 with an elevated Aldo/renin ratio> 104.8, this was done in 2019 through cardiology, she was started on Spironolactone at the time  - CT scan of adrenal did not show evidence of adenoma 06/2021 - Hyperaldo confirmation offered , but pt opted for medical management with Spironolactone  -BP is well controlled today, I have recommended increasing spironolactone and discontinuing chlorthalidone    STOP Chlorthalidone 25 mg daily Increase spironolactone 100 mg daily Continue Avapro150 mg daily Continue metoprolol 100 mg daily Continue amlodipine 10 mg daily    F/U in 6  months  Signed electronically by: Mack Guise, MD  [facility]  [facility] [address] [place], [place] [address] Phone: [phone] FAX: [phone]   CC: Maximiano Coss, NP [place] [address] Phone: [phone]  Fax: [phone]  Return to Endocrinology clinic as below: Future Appointments  Date Time Provider [place]  12/29/2022  8:50 AM Ameerah Huffstetler, Melanie Crazier, MD [facility] None

## 2022-08-25 NOTE — Telephone Encounter (Signed)
Switched NovoLog to Humalog

## 2022-08-25 NOTE — Telephone Encounter (Signed)
Received notification from [employer] regarding a prior authorization for Novolog FlexPen.  Per CMM and test claim, Humalog is preferred.   Per test claim, copay for 30 days supply is $15.00 for the Humalog.   Message doctor to see if alternate therapy is appropriate or proceed with prior auth.

## 2022-08-25 NOTE — Telephone Encounter (Deleted)
Received notification from [employer] regarding a prior authorization for Novolog FlexPen.   Authorization has been submitted and is pending.   Key: [id number]

## 2022-08-25 NOTE — Telephone Encounter (Signed)
Patient Advocate Encounter   Received notification from [employer] that prior authorization is required for Dexcom G6 Sensor. PA submitted and APPROVED on 08/25/2022  Key  [id number]  Effective: 08/25/2022 - 08/25/2023  PA approved for Sensor, Receiver, and Transmitter

## 2022-08-25 NOTE — Patient Instructions (Addendum)
-   Increase Novolog 10 units with each meal  - Increase Semglee to 40 units daily  - Continue Metformin 500 mg , 1 tablets with Breakfast and Supper  - Continue  Ozempic 2 mg weekly    STOP Chlorthalidone  Increase Spironolactone 100 mg daily        - HOW TO TREAT LOW BLOOD SUGARS (Blood sugar LESS THAN 70 MG/DL) Please follow the RULE OF 15 for the treatment of hypoglycemia treatment (when your (blood sugars are less than 70 mg/dL)   STEP 1: Take 15 grams of carbohydrates when your blood sugar is low, which includes:  3-4 GLUCOSE TABS  OR 3-4 OZ OF JUICE OR REGULAR SODA OR ONE TUBE OF GLUCOSE GEL    STEP 2: RECHECK blood sugar in 15 MINUTES STEP 3: If your blood sugar is still low at the 15 minute recheck --> then, go back to STEP 1 and treat AGAIN with another 15 grams of carbohydrates.

## 2022-09-01 ENCOUNTER — Other Ambulatory Visit (HOSPITAL_COMMUNITY): Payer: Self-pay

## 2022-09-01 ENCOUNTER — Telehealth: Payer: Self-pay

## 2022-09-01 DIAGNOSIS — E1165 Type 2 diabetes mellitus with hyperglycemia: Secondary | ICD-10-CM

## 2022-09-01 NOTE — Telephone Encounter (Signed)
Pharmacy Patient Advocate Encounter   Received notification from [facility] that prior authorization for Ozempic (2 MG/DOSE) 8MG /3ML pen-injectors is required/requested.  PA submitted on 09/01/2022 to [employer] via CoverMyMeds Key [id number]  Status is pending

## 2022-09-02 NOTE — Telephone Encounter (Signed)
Pharmacy Patient Advocate Encounter  Received notification from [facility] that the request for prior authorization for Ozempic (2 MG/DOSE) 8MG /3ML pen-injectors has been denied due to nothing to support that the individual has had failure, contraindication, or intolerance to the covered alternative, Trulicity (dulaglutide)..     Key: [id number]  Denial letter attached to patient's chart

## 2022-09-03 MED ORDER — TRULICITY 3 MG/0.5ML ~~LOC~~ SOAJ: 3.0000 mg | SUBCUTANEOUS | 3 refills | Status: DC

## 2022-09-03 NOTE — Telephone Encounter (Signed)
Pt contacted and advised of rx change.

## 2022-09-13 ENCOUNTER — Telehealth: Payer: Self-pay

## 2022-09-13 MED ORDER — BASAGLAR KWIKPEN 100 UNIT/ML ~~LOC~~ SOPN: 40.0000 [IU] | PEN_INJECTOR | Freq: Every day | SUBCUTANEOUS | 3 refills | Status: DC

## 2022-09-13 NOTE — Telephone Encounter (Signed)
Per request from [employer] will cover Basaglar but not Semglee.

## 2022-09-13 NOTE — Telephone Encounter (Signed)
Done

## 2022-09-30 ENCOUNTER — Other Ambulatory Visit (HOSPITAL_COMMUNITY): Payer: Self-pay

## 2022-11-12 ENCOUNTER — Other Ambulatory Visit: Payer: Self-pay | Admitting: Internal Medicine

## 2022-11-12 DIAGNOSIS — I10 Essential (primary) hypertension: Secondary | ICD-10-CM

## 2022-11-30 ENCOUNTER — Other Ambulatory Visit: Payer: Commercial Managed Care - HMO

## 2022-11-30 ENCOUNTER — Telehealth: Payer: Self-pay | Admitting: Registered Nurse

## 2022-11-30 ENCOUNTER — Ambulatory Visit (INDEPENDENT_AMBULATORY_CARE_PROVIDER_SITE_OTHER): Payer: Commercial Managed Care - HMO | Admitting: Family

## 2022-11-30 ENCOUNTER — Other Ambulatory Visit (INDEPENDENT_AMBULATORY_CARE_PROVIDER_SITE_OTHER): Payer: Commercial Managed Care - HMO

## 2022-11-30 VITALS — BP 134/78 | HR 96 | Temp 97.5°F | Ht 63.0 in | Wt 163.6 lb

## 2022-11-30 DIAGNOSIS — E785 Hyperlipidemia, unspecified: Secondary | ICD-10-CM | POA: Diagnosis not present

## 2022-11-30 DIAGNOSIS — E119 Type 2 diabetes mellitus without complications: Secondary | ICD-10-CM | POA: Diagnosis not present

## 2022-11-30 DIAGNOSIS — K219 Gastro-esophageal reflux disease without esophagitis: Secondary | ICD-10-CM

## 2022-11-30 DIAGNOSIS — Z794 Long term (current) use of insulin: Secondary | ICD-10-CM

## 2022-11-30 DIAGNOSIS — I10 Essential (primary) hypertension: Secondary | ICD-10-CM

## 2022-11-30 LAB — COMPREHENSIVE METABOLIC PANEL
ALT: 14 U/L (ref 0–35)
AST: 13 U/L (ref 0–37)
Albumin: 4.8 g/dL (ref 3.5–5.2)
Alkaline Phosphatase: 81 U/L (ref 39–117)
BUN: 16 mg/dL (ref 6–23)
CO2: 24 mEq/L (ref 19–32)
Calcium: 10 mg/dL (ref 8.4–10.5)
Chloride: 100 mEq/L (ref 96–112)
Creatinine, Ser: 1.19 mg/dL (ref 0.40–1.20)
GFR: 52.59 mL/min — ABNORMAL LOW (ref >60.00)
Glucose, Bld: 323 mg/dL — ABNORMAL HIGH (ref 70–99)
Potassium: 4.8 mEq/L (ref 3.5–5.1)
Sodium: 135 mEq/L (ref 135–145)
Total Bilirubin: 0.3 mg/dL (ref 0.2–1.2)
Total Protein: 7.9 g/dL (ref 6.0–8.3)

## 2022-11-30 LAB — CBC WITH DIFFERENTIAL/PLATELET
Basophils Absolute: 0 10*3/uL (ref 0.0–0.1)
Basophils Relative: 0.5 % (ref 0.0–3.0)
Eosinophils Absolute: 0.2 10*3/uL (ref 0.0–0.7)
Eosinophils Relative: 1.9 % (ref 0.0–5.0)
HCT: 42.4 % (ref 36.0–46.0)
Hemoglobin: 13.7 g/dL (ref 12.0–15.0)
Lymphocytes Relative: 32.9 % (ref 12.0–46.0)
Lymphs Abs: 2.7 10*3/uL (ref 0.7–4.0)
MCHC: 32.4 g/dL (ref 30.0–36.0)
MCV: 79.3 fl (ref 78.0–100.0)
Monocytes Absolute: 0.6 10*3/uL (ref 0.1–1.0)
Monocytes Relative: 7.5 % (ref 3.0–12.0)
Neutro Abs: 4.7 10*3/uL (ref 1.4–7.7)
Neutrophils Relative %: 57.2 % (ref 43.0–77.0)
Platelets: 278 10*3/uL (ref 150.0–400.0)
RBC: 5.34 Mil/uL — ABNORMAL HIGH (ref 3.87–5.11)
RDW: 13.6 % (ref 11.5–15.5)
WBC: 8.2 10*3/uL (ref 4.0–10.5)

## 2022-11-30 LAB — MICROALBUMIN / CREATININE URINE RATIO
Creatinine,U: 158.2 mg/dL
Microalb Creat Ratio: 1.8 mg/g (ref 0.0–30.0)
Microalb, Ur: 2.8 mg/dL — ABNORMAL HIGH (ref 0.0–1.9)

## 2022-11-30 LAB — LIPID PANEL
Cholesterol: 147 mg/dL (ref 0–200)
HDL: 37.6 mg/dL — ABNORMAL LOW (ref >39.00)
LDL Cholesterol: 91 mg/dL (ref 0–99)
NonHDL: 109.75
Total CHOL/HDL Ratio: 4
Triglycerides: 92 mg/dL (ref 0.0–149.0)
VLDL: 18.4 mg/dL (ref 0.0–40.0)

## 2022-11-30 LAB — TSH: TSH: 1.64 u[IU]/mL (ref 0.35–5.50)

## 2022-11-30 MED ORDER — INSULIN SYRINGES (DISPOSABLE) U-100 0.5 ML MISC: 3 refills | Status: DC

## 2022-11-30 MED ORDER — AMLODIPINE BESYLATE 10 MG PO TABS: 10.0000 mg | ORAL_TABLET | Freq: Every day | ORAL | 0 refills | Status: DC

## 2022-11-30 MED ORDER — METOPROLOL SUCCINATE ER 100 MG PO TB24: ORAL_TABLET | ORAL | 0 refills | Status: DC

## 2022-11-30 MED ORDER — OMEPRAZOLE 20 MG PO CPDR: 20.0000 mg | DELAYED_RELEASE_CAPSULE | Freq: Two times a day (BID) | ORAL | 1 refills | Status: DC

## 2022-11-30 MED ORDER — IRBESARTAN 150 MG PO TABS: 150.0000 mg | ORAL_TABLET | Freq: Every day | ORAL | 0 refills | Status: DC

## 2022-11-30 MED ORDER — ATORVASTATIN CALCIUM 20 MG PO TABS: 20.0000 mg | ORAL_TABLET | Freq: Every day | ORAL | 0 refills | Status: DC

## 2022-11-30 NOTE — Patient Instructions (Signed)
Please schedule a diabetic eye exam.

## 2022-11-30 NOTE — Progress Notes (Signed)
Established Patient Office Visit  Subjective   Patient ID: Yolanda Herrera, female    DOB: September 30, 1971  Age: 52 y.o. MRN: [id number]  No chief complaint on file.   HPI 52 year old female with a history of Type 2 DM, HTN, HLD, and GERD is in today for a recheck. She is doing well. Reports eating more. Not exercising. Was doing well on Ozempic but the insurance company will no longer cover it. She has not had a diabetic eye exam. Feet are ok.   Review of Systems  Constitutional: Negative.   HENT: Negative.    Respiratory: Negative.    Cardiovascular: Negative.   Musculoskeletal: Negative.   Skin: Negative.   Neurological: Negative.   Endo/Heme/Allergies: Negative.   Psychiatric/Behavioral: Negative.    All other systems reviewed and are negative. Past Medical History:  Diagnosis Date  . Diabetes mellitus without complication ([place])   . Hyperlipidemia   . Hypertension     Social History   Socioeconomic History  . Marital status: Married    Spouse name: Not on file  . Number of children: 1  . Years of education: some college   . Highest education level: Not on file  Occupational History  . Occupation: Ship broker     Comment: Warehouse manager  Tobacco Use  . Smoking status: Never  . Smokeless tobacco: Never  Vaping Use  . Vaping Use: Never used  Substance and Sexual Activity  . Alcohol use: No  . Drug use: Never  . Sexual activity: Not Currently  Other Topics Concern  . Not on file  Social History Narrative   Pt is from [place]. Moved here in 2001. 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    Social Determinants of Health   Financial Resource Strain: Low Risk  (02/27/2018)   Overall Financial Resource Strain (CARDIA)   . Difficulty of Paying Living Expenses: Not hard at all  Food Insecurity: No Food Insecurity (02/27/2018)   Hunger Vital Sign   . Worried About Charity fundraiser in the Last Year: Never true   . Ran Out of Food in the Last Year:  Never true  Transportation Needs: No Transportation Needs (02/27/2018)   PRAPARE - Transportation   . Lack of Transportation (Medical): No   . Lack of Transportation (Non-Medical): No  Physical Activity: Insufficiently Active (02/27/2018)   Exercise Vital Sign   . Days of Exercise per Week: 1 day   . Minutes of Exercise per Session: 90 min  Stress: No Stress Concern Present (02/27/2018)   [place]   . Feeling of Stress : Not at all  Social Connections: Socially Integrated (02/27/2018)   Social Connection and Isolation Panel [NHANES]   . Frequency of Communication with Friends and Family: More than three times a week   . Frequency of Social Gatherings with Friends and Family: Once a week   . Attends Religious Services: More than 4 times per year   . Active Member of Clubs or Organizations: Yes   . Attends Archivist Meetings: More than 4 times per year   . Marital Status: Married  Human resources officer Violence: Not At Risk (02/27/2018)   Humiliation, Afraid, Rape, and Kick questionnaire   . Fear of Current or Ex-Partner: No   . Emotionally Abused: No   . Physically Abused: No   . Sexually Abused: No    Past Surgical History:  Procedure Laterality Date  . CESAREAN SECTION    . UTERINE FIBROID SURGERY      Family History  Problem Relation Age of Onset  . Diabetes Mother   . Diabetes Father   . Kidney disease Father   . Hypertension Father   . Hypertension Sister   . Stroke Sister     No Known Allergies  Current Outpatient Medications on File Prior to Visit  Medication Sig Dispense Refill  . blood glucose meter kit and supplies per insurance preference. Use up to three times daily as directed. E1.9, Z79.4 Please dispense lancets and glucose test stripes to match glucometer, 100 each 1 each 11  . Continuous Blood Gluc Sensor (DEXCOM G6 SENSOR) MISC 1 Device by Does not apply route as directed. 9 each 3   . Continuous Blood Gluc Transmit (DEXCOM G6 TRANSMITTER) MISC 1 Device by Does not apply route as directed. 1 each 3  . Dulaglutide (TRULICITY) 3 HY/0.7PX SOPN Inject 3 mg as directed once a week. 6 mL 3  . glucose blood (ONETOUCH VERIO) test strip 1 each by Other route 3 (three) times daily. Use as instructed 300 each 3  . Insulin Glargine (BASAGLAR KWIKPEN) 100 UNIT/ML Inject 40 Units into the skin daily. 45 mL 3  . Insulin Pen Needle (BD PEN NEEDLE NANO 2ND GEN) 32G X 4 MM MISC USE TO INJECT INSULIN IN THE MORNING, AT NOON, IN THE EVENING AND AT BEDTIME. 400 each 5  . metFORMIN (GLUCOPHAGE XR) 500 MG 24 hr tablet Take 1 tablet (500 mg total) by mouth in the morning and at bedtime. 180 tablet 3  . spironolactone (ALDACTONE) 100 MG tablet Take 1 tablet (100 mg total) by mouth daily. 90 tablet 3  . PEG-KCl-NaCl-NaSulf-Na Asc-C (PLENVU) 140 g SOLR Use as directed. Manufacturer's coupon Universal coupon code:BIN: P2366821; GROUP: [id number]; PCN: CNRX; ID: [id number]; PAY NO MORE $50; NO prior authorization (Patient not taking: Reported on 11/30/2022) 1 each 0   No current facility-administered medications on file prior to visit.    BP 134/78   Pulse 96   Temp (!) 97.5 F (36.4 C) (Temporal)   Ht 5\' 3"  (1.6 m)   Wt 163 lb 9.6 oz (74.2 kg)   LMP 01/18/2018 (Approximate)   SpO2 99%   BMI 28.98 kg/m chart    Objective:     BP 134/78   Pulse 96   Temp (!) 97.5 F (36.4 C) (Temporal)   Ht 5\' 3"  (1.6 m)   Wt 163 lb 9.6 oz (74.2 kg)   LMP 01/18/2018 (Approximate)   SpO2 99%   BMI 28.98 kg/m    Physical Exam Vitals and nursing note reviewed.  Constitutional:      Appearance: Normal appearance. She is obese.  Cardiovascular:     Rate and Rhythm: Normal rate and regular rhythm.  Pulmonary:     Effort: Pulmonary effort is normal.     Breath sounds: Normal breath sounds.  Abdominal:     General: Abdomen is flat. Bowel sounds are normal.     Palpations: Abdomen is soft.      Tenderness: There is no abdominal tenderness. There is no guarding or rebound.  Musculoskeletal:        General: Normal range of motion.     Cervical back: Normal range of motion and neck supple.  Skin:    General: Skin is warm and dry.     Comments: Feet skin intact  Neurological:     General: No  focal deficit present.     Mental Status: She is alert and oriented to person, place, and time. Mental status is at baseline.     Comments: Monofilament intact  Psychiatric:        Mood and Affect: Mood normal.        Behavior: Behavior normal.    No results found for any visits on 11/30/22.    The 10-year ASCVD risk score (Arnett DK, et al., 2019) is: 16%    Assessment & Plan:   Problem List Items Addressed This Visit     Type 2 diabetes mellitus without complication, with long-term current use of insulin (HCC)   Relevant Medications   irbesartan (AVAPRO) 150 MG tablet   atorvastatin (LIPITOR) 20 MG tablet   Other Relevant Orders   Hemoglobin A1c   CMP   CBC w/Diff   TSH   Lipid panel   Microalbumin/Creatinine Ratio, Urine   Hyperlipidemia - Primary   Relevant Medications   amLODipine (NORVASC) 10 MG tablet   irbesartan (AVAPRO) 150 MG tablet   metoprolol succinate (TOPROL-XL) 100 MG 24 hr tablet   atorvastatin (LIPITOR) 20 MG tablet   Other Relevant Orders   Lipid panel   Gastroesophageal reflux disease   Relevant Medications   omeprazole (PRILOSEC) 20 MG capsule   Other Relevant Orders   Hemoglobin A1c   CMP   CBC w/Diff   TSH   Lipid panel   Microalbumin/Creatinine Ratio, Urine   Essential hypertension   Relevant Medications   amLODipine (NORVASC) 10 MG tablet   Insulin Syringes, Disposable, U-100 0.5 ML MISC   irbesartan (AVAPRO) 150 MG tablet   metoprolol succinate (TOPROL-XL) 100 MG 24 hr tablet   atorvastatin (LIPITOR) 20 MG tablet   Other Relevant Orders   Hemoglobin A1c   CMP   CBC w/Diff   TSH   Lipid panel   Microalbumin/Creatinine Ratio,  Urine   Encouraged a healthy diet, exercise, annual eye exam and nightly feet checks. Recheck in 3-4 months with new PCP. Will follow-up pending lab results and sooner as needed.  Return in about 2 months (around 01/29/2023).    Kennyth Arnold, FNP

## 2022-12-01 ENCOUNTER — Other Ambulatory Visit: Payer: Commercial Managed Care - HMO

## 2022-12-01 LAB — HEMOGLOBIN A1C
Hgb A1c MFr Bld: 11.5 % of total Hgb — ABNORMAL HIGH (ref <5.7)
Mean Plasma Glucose: 283 mg/dL
eAG (mmol/L): 15.7 mmol/L

## 2022-12-02 ENCOUNTER — Telehealth: Payer: Self-pay

## 2022-12-02 NOTE — Telephone Encounter (Signed)
-----  Message from Kennyth Arnold, [place] sent at 12/02/2022  8:39 AM EST ----- Glucose is too high. We have to control the diabetes better in order to be able to protect your kidneys. I have sent your labs to your endocrinologist for review. Please follow-up with her.  Cholesterol looks good. However we need to incorporate physical exercise to help protect the heart.

## 2022-12-02 NOTE — Telephone Encounter (Signed)
Called back no answer left another message

## 2022-12-02 NOTE — Telephone Encounter (Signed)
error 

## 2022-12-02 NOTE — Telephone Encounter (Signed)
Called pt no answer, LM to call the office back for results

## 2022-12-02 NOTE — Telephone Encounter (Signed)
Patient called back to speak with [facility] about her results.

## 2022-12-02 NOTE — Telephone Encounter (Signed)
Patient called back to speak with mackenzie 

## 2022-12-03 NOTE — Telephone Encounter (Signed)
Called pt and informed she was in agreement and will call her ENDO

## 2022-12-06 ENCOUNTER — Telehealth: Payer: Self-pay | Admitting: Registered Nurse

## 2022-12-06 DIAGNOSIS — K219 Gastro-esophageal reflux disease without esophagitis: Secondary | ICD-10-CM

## 2022-12-06 DIAGNOSIS — I10 Essential (primary) hypertension: Secondary | ICD-10-CM

## 2022-12-06 DIAGNOSIS — E119 Type 2 diabetes mellitus without complications: Secondary | ICD-10-CM

## 2022-12-06 MED ORDER — AMLODIPINE BESYLATE 10 MG PO TABS: 10.0000 mg | ORAL_TABLET | Freq: Every day | ORAL | 0 refills | Status: DC

## 2022-12-06 MED ORDER — IRBESARTAN 150 MG PO TABS: 150.0000 mg | ORAL_TABLET | Freq: Every day | ORAL | 0 refills | Status: DC

## 2022-12-06 MED ORDER — METOPROLOL SUCCINATE ER 100 MG PO TB24: ORAL_TABLET | ORAL | 0 refills | Status: DC

## 2022-12-06 MED ORDER — INSULIN SYRINGES (DISPOSABLE) U-100 0.5 ML MISC: 3 refills | Status: DC

## 2022-12-06 MED ORDER — ATORVASTATIN CALCIUM 20 MG PO TABS: 20.0000 mg | ORAL_TABLET | Freq: Every day | ORAL | 0 refills | Status: DC

## 2022-12-06 MED ORDER — OMEPRAZOLE 20 MG PO CPDR: 20.0000 mg | DELAYED_RELEASE_CAPSULE | Freq: Two times a day (BID) | ORAL | 1 refills | Status: AC

## 2022-12-06 NOTE — Telephone Encounter (Signed)
Let patient know.

## 2022-12-06 NOTE — Telephone Encounter (Signed)
Sent will call patient to inform

## 2022-12-06 NOTE — Telephone Encounter (Signed)
Patient called stating that all her refills were sent to the wrong pharmacy. Patient has updated the correct [facility] ([place]), [place] - [address]   amLODipine (NORVASC) 10 MG tablet  atorvastatin (LIPITOR) 20 MG tablet  Insulin Glargine (BASAGLAR KWIKPEN) 100 UNIT/ML  Insulin Pen Needle (BD PEN NEEDLE NANO 2ND GEN) 32G X 4 MM MISC  irbesartan (AVAPRO) 150 MG tablet  metoprolol succinate (TOPROL-XL) 100 MG 24 hr tablet  omeprazole (PRILOSEC) 20 MG capsule

## 2022-12-29 ENCOUNTER — Ambulatory Visit: Payer: Commercial Managed Care - HMO | Admitting: Internal Medicine

## 2022-12-29 ENCOUNTER — Ambulatory Visit: Payer: Commercial Managed Care - HMO | Admitting: Family Medicine

## 2022-12-29 NOTE — Progress Notes (Deleted)
Name: Yolanda Herrera  Age/ Sex: 52 y.o., female   MRN/ DOB: [id number], [birth year]     PCP: Maximiano Coss, NP   Reason for Endocrinology Evaluation: Type 2 Diabetes Mellitus  Initial Endocrine Consultative Visit: 01/11/2020    PATIENT IDENTIFIER: Yolanda Herrera is a 52 y.o. female with a past medical history of DM and dyslipidemia. The patient has followed with Endocrinology clinic since 01/11/2020 for consultative assistance with management of her diabetes.  DIABETIC HISTORY:  Yolanda Herrera was diagnosed with DM in 2011.  She has been on Metformin since her diagnosis, as well as glimepiride.  Yolanda Herrera was cost prohibitive.  Ozempic was started in 12/2019.  Her hemoglobin A1c has ranged from 9.2% in 2019, peaking at 14.9% in 2021.   On her initial visit to our clinic, her A1c was 14.9%   , she was on Metformin, glimepiride, Ozempic, and NPH.  We stopped  glimepiride and NPH, we increase Metformin, continue to Ozempic and started Lantus.   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   Dexamethasone suppression test normal 08/2021   She was evaluated by GI for pancreatic cyst 07/2021     SUBJECTIVE:   During the last visit (11/19/2021): A1c 9.8 %    Today (12/29/2022): Yolanda Herrera is here for follow-up on diabetes management. She has not been checking glucose at home because her monitor broke.  . The patient has not had hypoglycemic episodes since  the last clinic visit  Denies nausea or diarrhea but has chronic constipation  Dexcom was approved by her insurance 08/2022   HOME DIABETES REGIMEN:  Semglee 40 units daily  Novolog 10 units with each meal  Metformin 500 mg, 1 tabs  BID Ozempic 2 mg weekly  Avapro 150 mg daily Metoprolol 100 mg daily Amlodipine 10 mg daily spironolactone 100 mg daily     Statin: Yes ACE-I/ARB: Yes   METER DOWNLOAD SUMMARY: Did not bring  BG > 160 mg/dL    DIABETIC COMPLICATIONS: Microvascular complications:  Retinopathy ? Right,  Denies: neuropathy  Last eye exam: Completed 12/2019   Macrovascular complications:    Denies: CAD, PVD, CVA  HISTORY:  Past Medical History:  Past Medical History:  Diagnosis Date   Diabetes mellitus without complication ([place])    Hyperlipidemia    Hypertension    Past Surgical History:  Past Surgical History:  Procedure Laterality Date   CESAREAN SECTION     UTERINE FIBROID SURGERY     Social History:  reports that she has never smoked. She has never used smokeless tobacco. She reports that she does not drink alcohol and does not use drugs. Family History:  Family History  Problem Relation Age of Onset   Diabetes Mother    Diabetes Father    Kidney disease Father    Hypertension Father    Hypertension Sister    Stroke Sister      HOME MEDICATIONS: Allergies  as of 12/29/2022   No Known Allergies      Medication List        Accurate as of December 29, 2022  7:16 AM. If you have any questions, ask your nurse or doctor.          amLODipine 10 MG tablet Commonly known as: NORVASC Take 1 tablet (10 mg total) by mouth daily.   atorvastatin 20 MG tablet Commonly known as: LIPITOR Take 1 tablet (20 mg total) by mouth daily.   Basaglar KwikPen 100 UNIT/ML Inject 40 Units into the skin daily.   BD Pen Needle Nano 2nd Gen 32G X 4 MM Misc Generic drug: Insulin Pen Needle USE TO INJECT INSULIN IN THE MORNING, AT NOON, IN THE  EVENING AND AT BEDTIME.   blood glucose meter kit and supplies per insurance preference. Use up to three times daily as directed. E1.9, Z79.4 Please dispense lancets and glucose test stripes to match glucometer, 100 each   Dexcom G6 Sensor Misc 1 Device by Does not apply route as directed.   Dexcom G6 Transmitter Misc 1 Device by Does not apply route as directed.   Insulin Syringes (Disposable) U-100 0.5 ML Misc Use twice with a day with NPH. Dx E11.65, Z79.4   irbesartan 150 MG tablet Commonly known as: AVAPRO Take 1 tablet (150 mg total) by mouth daily.   metFORMIN 500 MG 24 hr tablet Commonly known as: Glucophage XR Take 1 tablet (500 mg total) by mouth in the morning and at bedtime.   metoprolol succinate 100 MG 24 hr tablet Commonly known as: TOPROL-XL TAKE 1 TABLET BY MOUTH ONCE DAILY.TAKE WITH OR IMMEDIATELY FOLLOWING A MEAL.   omeprazole 20 MG capsule Commonly known as: PRILOSEC Take 1 capsule (20 mg total) by mouth 2 (two) times daily before a meal.   OneTouch Verio test strip Generic drug: glucose blood 1 each by Other route 3 (three) times daily. Use as instructed   Plenvu 140 g Solr Generic drug: PEG-KCl-NaCl-NaSulf-Na Asc-C Use as directed. Manufacturer's coupon Universal coupon code:BIN: P2366821; GROUPBK:[id number]; PCN: [id number]; IDDA:[id number]; PAY NO MORE $50; NO prior authorization   spironolactone 100 MG tablet Commonly known as: ALDACTONE Take 1 tablet (100 mg total) by mouth daily.   Trulicity 3 0000000 Sopn Generic drug: Dulaglutide Inject 3 mg as directed once a week.         OBJECTIVE:   Vital Signs: LMP 01/18/2018 (Approximate)   Wt Readings from Last 3 Encounters:  11/30/22 163 lb 9.6 oz (74.2 kg)  08/25/22 156 lb (70.8 kg)  06/02/22 156 lb 3.2 oz (70.9 kg)     Exam: General: Pt appears well and is in NAD  Lungs: Clear with good BS bilat   Heart: RRR  Abdomen: Normoactive bowel sounds, soft, nontender, without masses or  organomegaly palpable  Extremities: No pretibial edema. .  Neuro: MS is good with appropriate affect, pt is alert and Ox3    DM foot exam: 08/01/2020  The skin of the feet is intact without sores or ulcerations. The pedal pulses are 2+ on right and 2+ on left. The sensation is decreased  to a screening 5.07, 10 gram monofilament on the right    DATA REVIEWED:  Lab Results  Component Value Date   HGBA1C 11.5 (H) 11/30/2022   HGBA1C 9.8 (A) 08/25/2022   HGBA1C 13.7 (A) 06/02/2022    Latest Reference Range & Units 11/30/22 09:12  Sodium 135 - 145 mEq/L 135  Potassium 3.5 -  5.1 mEq/L 4.8  Chloride 96 - 112 mEq/L 100  CO2 19 - 32 mEq/L 24  Glucose 70 - 99 mg/dL 323 (H)  BUN 6 - 23 mg/dL 16  Creatinine 0.40 - 1.20 mg/dL 1.19  Calcium 8.4 - 10.5 mg/dL 10.0  Alkaline Phosphatase 39 - 117 U/L 81  Albumin 3.5 - 5.2 g/dL 4.8  AST 0 - 37 U/L 13  ALT 0 - 35 U/L 14  Total Protein 6.0 - 8.3 g/dL 7.9  Total Bilirubin 0.2 - 1.2 mg/dL 0.3  GFR >60.00 mL/min 52.59 (L)  Total CHOL/HDL Ratio  4  Cholesterol 0 - 200 mg/dL 147  HDL Cholesterol >39.00 mg/dL 37.60 (L)  LDL (calc) 0 - 99 mg/dL 91  MICROALB/CREAT RATIO 0.0 - 30.0 mg/g 1.8  NonHDL  109.75  Triglycerides 0.0 - 149.0 mg/dL 92.0  VLDL 0.0 - 40.0 mg/dL 18.4  (H): Data is abnormally high (L): Data is abnormally low    CT abdomen 06/05/2021   IMPRESSION: 1. Bilateral adrenal glands appear normal.     ASSESSMENT / PLAN / RECOMMENDATIONS:   1) Type 2 Diabetes Mellitus, poorly controlled, With retinopathic complications - Most recent A1c of  9.8  %. Goal A1c < 7.0 %.     -Poorly controlled diabetes  -In the past we discussed SGLT2 inhibitors but were cost prohibitive, we also discussed in the past pioglitazone but she was reluctant to start due to weight gain. - She did not take her basal insulin last night because she had a bruise at previous injection site ? We discussed she has other sites to inject, she has not been  checking glucose and was encouraged to contact us for a new prescription of a glucose meter if needed in the future -She does not like to inject herself multiple times a day, I have offered an insulin pump as an alternative therapy -We did to prescribe the OmniPod and the Dexcom to her pharmacy and see what the cost is, we may also consider tandem pump if needed, she will notify me if she receives the OmniPod so I can put in a referral for CDE training -She was advised to take her prandial insulin before the meal rather than after the meal -We will increase insulin as below    MEDICATIONS:  -Increase Humalog 10 units with each meal - Increase Semglee 40  units daily  - Continue metformin 500 mg , 1 tab twice daily - Continue  Ozempic  2 mg weekly    EDUCATION / INSTRUCTIONS: BG monitoring instructions: Patient is instructed to check her blood sugars 1 times a day. Call [facility] if: BG persistently < 70  I reviewed the Rule of 15 for the treatment of hypoglycemia in detail with the patient. Literature supplied.    2) Diabetic complications:  Eye: Does have known diabetic retinopathy.  Neuro/ Feet: Does not have known diabetic peripheral neuropathy. Renal: Patient does not have known baseline CKD. She is  on an ACEI/ARB at present   3)Hyperaldosteronism:   -In review of records she has been noted to have a suppressed renin<0.167, aldosterone of 17.5 with an elevated Aldo/renin ratio> 104.8, this was done in 2019 through cardiology, she was started on Spironolactone at the time  - CT scan of adrenal did not show evidence of adenoma 06/2021 - Hyperaldo confirmation offered , but pt opted for medical management with Spironolactone  -BP is well controlled today, I have recommended increasing spironolactone and discontinuing chlorthalidone  Increase spironolactone 100 mg daily Continue Avapro150 mg daily Continue metoprolol 100 mg daily Continue amlodipine 10  mg daily    F/U in 6 months  Signed electronically by: Mack Guise, MD  [facility]  [facility] [address] [place], [place] [address] Phone: [phone] FAX: [phone]   CC: Maximiano Coss, NP [place] [address] Phone: [phone]  Fax: [phone]  Return to Endocrinology clinic as below: Future Appointments  Date Time Provider [place]  12/29/2022  8:50 AM Laiyla Slagel, Melanie Crazier, MD [facility] None  01/27/2023  9:40 AM Evangeline Gula, NP [facility]

## 2023-01-07 ENCOUNTER — Other Ambulatory Visit (HOSPITAL_COMMUNITY): Payer: Self-pay

## 2023-01-27 ENCOUNTER — Ambulatory Visit: Payer: Commercial Managed Care - HMO | Admitting: Family Medicine

## 2023-03-07 NOTE — Progress Notes (Unsigned)
New Patient Office Visit  Subjective    Patient ID: Yolanda Herrera, female    DOB: 07/12/1971  Age: 52 y.o. MRN: [id number]  CC: No chief complaint on file.   HPI Yolanda Herrera presents to establish care with new provider.   Patients previous primary care provider was Janeece Agee, NP.   Specialist: [facility]-Dr. Lamount Cranker Shamleffer  Hyperlipidemia: Chronic. Patient is taking Atrovastatin 20mg  daily.   GERD: Chronic. Patient is taking Omeprazole 20mg  daily.   HTN: Chronic. Patient is taking Amlodipine 10mg  daily, Irbesartan 150mg  daily, and Metoprolol Succinate 100mg  daily.   Endocrinology is managing diabetes.   Outpatient Encounter Medications as of 03/08/2023  Medication Sig   amLODipine (NORVASC) 10 MG tablet Take 1 tablet (10 mg total) by mouth daily.   atorvastatin (LIPITOR) 20 MG tablet Take 1 tablet (20 mg total) by mouth daily.   blood glucose meter kit and supplies per insurance preference. Use up to three times daily as directed. E1.9, Z79.4 Please dispense lancets and glucose test stripes to match glucometer, 100 each   Continuous Blood Gluc Sensor (DEXCOM G6 SENSOR) MISC 1 Device by Does not apply route as directed.   Continuous Blood Gluc Transmit (DEXCOM G6 TRANSMITTER) MISC 1 Device by Does not apply route as directed.   Dulaglutide (TRULICITY) 3 MG/0.5ML SOPN Inject 3 mg as directed once a week.   glucose blood (ONETOUCH VERIO) test strip 1 each by Other route 3 (three) times daily. Use as instructed   Insulin Glargine (BASAGLAR KWIKPEN) 100 UNIT/ML Inject 40 Units into the skin daily.   Insulin Pen Needle (BD PEN NEEDLE NANO 2ND GEN) 32G X 4 MM MISC USE TO INJECT INSULIN IN THE MORNING, AT NOON, IN THE EVENING AND AT BEDTIME.   Insulin Syringes, Disposable, U-100 0.5 ML MISC Use twice with a day with NPH. Dx E11.65, Z79.4   irbesartan (AVAPRO) 150 MG tablet Take 1 tablet (150 mg total) by mouth daily.   metFORMIN  (GLUCOPHAGE XR) 500 MG 24 hr tablet Take 1 tablet (500 mg total) by mouth in the morning and at bedtime.   metoprolol succinate (TOPROL-XL) 100 MG 24 hr tablet TAKE 1 TABLET BY MOUTH ONCE DAILY.TAKE WITH OR IMMEDIATELY FOLLOWING A MEAL.   omeprazole (PRILOSEC) 20 MG capsule Take 1 capsule (20 mg total) by mouth 2 (two) times daily before a meal.   PEG-KCl-NaCl-NaSulf-Na Asc-C (PLENVU) 140 g SOLR Use as directed. Manufacturer's coupon Universal coupon code:BIN: [id number]; GROUP: [id number]; PCN: [id number]; ID: [id number]; PAY NO MORE $50; NO prior authorization (Patient not taking: Reported on 11/30/2022)   spironolactone (ALDACTONE) 100 MG tablet Take 1 tablet (100 mg total) by mouth daily.   No facility-administered encounter medications on file as of 03/08/2023.    Past Medical History:  Diagnosis Date   Diabetes mellitus without complication (HCC)    Hyperlipidemia    Hypertension     Past Surgical History:  Procedure Laterality Date   CESAREAN SECTION     UTERINE FIBROID SURGERY      Family History  Problem Relation Age of Onset   Diabetes Mother    Diabetes Father    Kidney disease Father    Hypertension Father    Hypertension Sister    Stroke Sister     Social History   Socioeconomic History   Marital status: Married    Spouse name: Not on file   Number of children: 1   Years of education: some college  Highest education level: Not on file  Occupational History   Occupation: Student     Comment: Radio broadcast assistant  Tobacco Use   Smoking status: Never   Smokeless tobacco: Never  Vaping Use   Vaping Use: Never used  Substance and Sexual Activity   Alcohol use: No   Drug use: Never   Sexual activity: Not Currently  Other Topics Concern   Not on file  Social History Narrative   Pt is from [place]. Moved here in 2001. 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    Social Determinants of Health   Financial Resource Strain: Low Risk  (02/27/2018)    Overall Financial Resource Strain (CARDIA)    Difficulty of Paying Living Expenses: Not hard at all  Food Insecurity: No Food Insecurity (02/27/2018)   Hunger Vital Sign    Worried About Running Out of Food in the Last Year: Never true    Ran Out of Food in the Last Year: Never true  Transportation Needs: No Transportation Needs (02/27/2018)   PRAPARE - Administrator, Civil Service (Medical): No    Lack of Transportation (Non-Medical): No  Physical Activity: Insufficiently Active (02/27/2018)   Exercise Vital Sign    Days of Exercise per Week: 1 day    Minutes of Exercise per Session: 90 min  Stress: No Stress Concern Present (02/27/2018)   [employer] of Occupational Health - Occupational Stress Questionnaire    Feeling of Stress : Not at all  Social Connections: Socially Integrated (02/27/2018)   Social Connection and Isolation Panel [NHANES]    Frequency of Communication with Friends and Family: More than three times a week    Frequency of Social Gatherings with Friends and Family: Once a week    Attends Religious Services: More than 4 times per year    Active Member of [employer] or Organizations: Yes    Attends Engineer, structural: More than 4 times per year    Marital Status: Married  Catering manager Violence: Not At Risk (02/27/2018)   Humiliation, Afraid, Rape, and Kick questionnaire    Fear of Current or Ex-Partner: No    Emotionally Abused: No    Physically Abused: No    Sexually Abused: No    ROS See HPI above    Objective    LMP 01/18/2018 (Approximate)   Physical Exam    Assessment & Plan:  There are no diagnoses linked to this encounter.  No follow-ups on file.   Zandra Abts, NP

## 2023-03-08 ENCOUNTER — Encounter: Payer: Self-pay | Admitting: Family Medicine

## 2023-03-08 ENCOUNTER — Ambulatory Visit (INDEPENDENT_AMBULATORY_CARE_PROVIDER_SITE_OTHER): Payer: BLUE CROSS/BLUE SHIELD | Admitting: Family Medicine

## 2023-03-08 VITALS — BP 140/80 | HR 72 | Temp 98.2°F | Ht 63.0 in

## 2023-03-08 DIAGNOSIS — E785 Hyperlipidemia, unspecified: Secondary | ICD-10-CM | POA: Diagnosis not present

## 2023-03-08 DIAGNOSIS — L602 Onychogryphosis: Secondary | ICD-10-CM

## 2023-03-08 DIAGNOSIS — E1165 Type 2 diabetes mellitus with hyperglycemia: Secondary | ICD-10-CM

## 2023-03-08 DIAGNOSIS — I10 Essential (primary) hypertension: Secondary | ICD-10-CM | POA: Diagnosis not present

## 2023-03-08 DIAGNOSIS — Z124 Encounter for screening for malignant neoplasm of cervix: Secondary | ICD-10-CM

## 2023-03-08 DIAGNOSIS — Z13228 Encounter for screening for other metabolic disorders: Secondary | ICD-10-CM | POA: Insufficient documentation

## 2023-03-08 DIAGNOSIS — Z1211 Encounter for screening for malignant neoplasm of colon: Secondary | ICD-10-CM

## 2023-03-08 DIAGNOSIS — Z7689 Persons encountering health services in other specified circumstances: Secondary | ICD-10-CM

## 2023-03-08 DIAGNOSIS — Z23 Encounter for immunization: Secondary | ICD-10-CM

## 2023-03-08 DIAGNOSIS — Z1231 Encounter for screening mammogram for malignant neoplasm of breast: Secondary | ICD-10-CM

## 2023-03-08 DIAGNOSIS — K219 Gastro-esophageal reflux disease without esophagitis: Secondary | ICD-10-CM | POA: Diagnosis not present

## 2023-03-08 DIAGNOSIS — Z794 Long term (current) use of insulin: Secondary | ICD-10-CM

## 2023-03-08 DIAGNOSIS — K862 Cyst of pancreas: Secondary | ICD-10-CM

## 2023-03-08 NOTE — Assessment & Plan Note (Signed)
Continue with Atorvastatin 20mg  every other day. Ordered lipid panel. Patient is not fasting and will make an appointment to have labs drawn when fasting.

## 2023-03-08 NOTE — Assessment & Plan Note (Signed)
Continue with Amlodipine 10mg  daily, Irbesartan 150mg  daily, Metoprolol Succinate 100mg  daily, and Spironolactone 100mg  daily. Ordered CMP to assess kidney function and potassium. Patient is not fasting for lipids and will come back for a lab visit when fasting. With reviewing records, patient did see [facility] on [address] with Dr. Chelsea Aus in 2019 for resistant HTN. She was suppose to follow up, but never did. If blood pressure becomes uncontrolled with current regiment, will refer patient to cardiologist.

## 2023-03-08 NOTE — Patient Instructions (Addendum)
-  It was a pleasure to meet you today and I look forward to taking care of you. -Placed a referral to [facility] for pancreatic cyst that was found in 2022. You need additional scans performed based on CT in 06/2021. If you do not hear back in 2 weeks for an appointment, please call back to office. -Placed a referral to GYN for pap smear-cervical screening. If you do not hear back in 2 weeks for an appointment, please call back to office. -Placed a referral to podiatry for toe nails being thick and having diabetes. If you do not hear back in 2 weeks for an appointment, please call back to office.  -Placed an order for cologuard.  -Ordered labs. Recommend to make an appointment when fasting for labs. That means only drink water or black coffee within the 6 hours of lab draw. -Recommend to follow up with [facility] for diabetes management. Foot exam was completed today.  -Ordered mammogram for screening of breast cancer. If you do hear about an appointment in 2 weeks, please give office a call. -You received your 1st shingles vaccine: Shingrix. You will be due for your second vaccine in 3 months at your chronic management appointment.

## 2023-03-08 NOTE — Assessment & Plan Note (Signed)
Recommend patient follow back up with [facility], Dr. Lamount Cranker [facility] for diabetes management. She was suppose to follow up in April based on previous visit note in October 2023. Foot exam completed today. Recommend yearly ophthalmology eye exams. Foot exam completed today. She request to see a podiatrist for her hypertrophy toenails. Placed a referral to podiatry for hypertrophy nails and history of diabetes. Ordered A1c since it was 11.5 in January 2024.

## 2023-03-08 NOTE — Assessment & Plan Note (Signed)
Effective. Continue with Omeprazole 20mg  as needed.

## 2023-03-10 ENCOUNTER — Telehealth: Payer: Self-pay

## 2023-03-10 ENCOUNTER — Other Ambulatory Visit (INDEPENDENT_AMBULATORY_CARE_PROVIDER_SITE_OTHER): Payer: BLUE CROSS/BLUE SHIELD

## 2023-03-10 ENCOUNTER — Other Ambulatory Visit: Payer: BLUE CROSS/BLUE SHIELD

## 2023-03-10 DIAGNOSIS — E785 Hyperlipidemia, unspecified: Secondary | ICD-10-CM | POA: Diagnosis not present

## 2023-03-10 DIAGNOSIS — Z794 Long term (current) use of insulin: Secondary | ICD-10-CM

## 2023-03-10 DIAGNOSIS — I10 Essential (primary) hypertension: Secondary | ICD-10-CM | POA: Diagnosis not present

## 2023-03-10 LAB — LIPID PANEL
Cholesterol: 121 mg/dL (ref 0–200)
HDL: 32.6 mg/dL — ABNORMAL LOW (ref >39.00)
LDL Cholesterol: 67 mg/dL (ref 0–99)
NonHDL: 88.86
Total CHOL/HDL Ratio: 4
Triglycerides: 108 mg/dL (ref 0.0–149.0)
VLDL: 21.6 mg/dL (ref 0.0–40.0)

## 2023-03-10 LAB — COMPREHENSIVE METABOLIC PANEL
ALT: 67 U/L — ABNORMAL HIGH (ref 0–35)
AST: 30 U/L (ref 0–37)
Albumin: 4.1 g/dL (ref 3.5–5.2)
Alkaline Phosphatase: 81 U/L (ref 39–117)
BUN: 9 mg/dL (ref 6–23)
CO2: 27 mEq/L (ref 19–32)
Calcium: 9.2 mg/dL (ref 8.4–10.5)
Chloride: 100 mEq/L (ref 96–112)
Creatinine, Ser: 1.02 mg/dL (ref 0.40–1.20)
GFR: 63.16 mL/min (ref >60.00)
Glucose, Bld: 407 mg/dL — ABNORMAL HIGH (ref 70–99)
Potassium: 4 mEq/L (ref 3.5–5.1)
Sodium: 136 mEq/L (ref 135–145)
Total Bilirubin: 0.3 mg/dL (ref 0.2–1.2)
Total Protein: 7.2 g/dL (ref 6.0–8.3)

## 2023-03-10 NOTE — Telephone Encounter (Signed)
-----   Message from Alveria Apley, NP sent at 03/10/2023 12:43 PM EDT ----- Cholesterol is stable, except good (HDL) cholesterol is slightly low-recommend to increase fiber and antioxidants. Recommend to continue taking Atorvastatin 20 mg. ALT which is a liver enzyme is slightly elevated with no other enzymes elevated. Will continue monitor.  Glucose is elevated, waiting on A1c to result.

## 2023-03-11 ENCOUNTER — Other Ambulatory Visit: Payer: Self-pay

## 2023-03-11 DIAGNOSIS — R748 Abnormal levels of other serum enzymes: Secondary | ICD-10-CM

## 2023-03-11 LAB — HEMOGLOBIN A1C: Hgb A1c MFr Bld: >14 % of total Hgb — ABNORMAL HIGH (ref <5.7)

## 2023-03-11 NOTE — Telephone Encounter (Signed)
Made lab visit for 6/11 for recheck lab visit.

## 2023-03-11 NOTE — Telephone Encounter (Signed)
-----   Message from Alveria Apley, NP sent at 03/11/2023  8:02 AM EDT ----- Patients A1c is significantly elevated and I encourage patient  to make an appointment with [facility], Dr. Raelyn Mora as soon as possible. I am also routing this lab to her.

## 2023-03-17 ENCOUNTER — Ambulatory Visit (HOSPITAL_BASED_OUTPATIENT_CLINIC_OR_DEPARTMENT_OTHER)
Admission: RE | Admit: 2023-03-17 | Discharge: 2023-03-17 | Disposition: A | Discharge status: Home / Self Care | Payer: BLUE CROSS/BLUE SHIELD | Source: Ambulatory Visit | Attending: Family Medicine | Admitting: Family Medicine

## 2023-03-17 ENCOUNTER — Encounter (HOSPITAL_BASED_OUTPATIENT_CLINIC_OR_DEPARTMENT_OTHER): Payer: Self-pay | Admitting: Radiology

## 2023-03-17 DIAGNOSIS — Z1231 Encounter for screening mammogram for malignant neoplasm of breast: Secondary | ICD-10-CM | POA: Diagnosis not present

## 2023-03-18 ENCOUNTER — Telehealth: Payer: Self-pay

## 2023-03-18 NOTE — Telephone Encounter (Signed)
-----   Message from Alveria Apley, NP sent at 03/18/2023  2:48 PM EDT ----- Mammogram shows no evidence of malignancy. Screening mammogram in one year.

## 2023-03-18 NOTE — Telephone Encounter (Signed)
Informed pt of lab results  

## 2023-04-12 ENCOUNTER — Telehealth: Payer: Self-pay

## 2023-04-12 ENCOUNTER — Other Ambulatory Visit (INDEPENDENT_AMBULATORY_CARE_PROVIDER_SITE_OTHER): Payer: BLUE CROSS/BLUE SHIELD

## 2023-04-12 DIAGNOSIS — R748 Abnormal levels of other serum enzymes: Secondary | ICD-10-CM

## 2023-04-12 LAB — COMPREHENSIVE METABOLIC PANEL
ALT: 62 U/L — ABNORMAL HIGH (ref 0–35)
AST: 41 U/L — ABNORMAL HIGH (ref 0–37)
Albumin: 4.2 g/dL (ref 3.5–5.2)
Alkaline Phosphatase: 77 U/L (ref 39–117)
BUN: 8 mg/dL (ref 6–23)
CO2: 27 mEq/L (ref 19–32)
Calcium: 9.1 mg/dL (ref 8.4–10.5)
Chloride: 101 mEq/L (ref 96–112)
Creatinine, Ser: 0.89 mg/dL (ref 0.40–1.20)
GFR: 74.34 mL/min (ref >60.00)
Glucose, Bld: 351 mg/dL — ABNORMAL HIGH (ref 70–99)
Potassium: 3.5 mEq/L (ref 3.5–5.1)
Sodium: 136 mEq/L (ref 135–145)
Total Bilirubin: 0.5 mg/dL (ref 0.2–1.2)
Total Protein: 7.4 g/dL (ref 6.0–8.3)

## 2023-04-12 NOTE — Telephone Encounter (Signed)
Spoke to patient and ordered US

## 2023-04-12 NOTE — Telephone Encounter (Signed)
-----   Message from Alveria Apley, NP sent at 04/12/2023  4:04 PM EDT ----- AST is slightly elevated and ALT has improved a little from last lab draw, but still elevated. Recommend an ultrasound of liver (Dx: elevated liver enzymes) to make sure your liver is stable.

## 2023-04-13 NOTE — Telephone Encounter (Signed)
The order says external where should the [place] be performed?    Yolanda Herrera

## 2023-04-13 NOTE — Telephone Encounter (Signed)
Order faxed to : [facility]  Faxed to [phone] [email or web address]  Caryl Bis on 04/13/2023 1035 - delivered at 04/13/2023 1035  phone # [phone]    Pt is aware

## 2023-04-13 NOTE — Telephone Encounter (Signed)
Pt states her insurance stated that the [place] needs to be obtained at a [facility] office

## 2023-04-19 ENCOUNTER — Other Ambulatory Visit: Payer: Self-pay

## 2023-04-19 ENCOUNTER — Telehealth: Payer: Self-pay

## 2023-04-19 DIAGNOSIS — L602 Onychogryphosis: Secondary | ICD-10-CM

## 2023-04-19 DIAGNOSIS — E1165 Type 2 diabetes mellitus with hyperglycemia: Secondary | ICD-10-CM

## 2023-04-19 NOTE — Telephone Encounter (Signed)
Pt called and states she needs another referral to Podiatry the one we referred her to does not take her insurance .  She' states it needs to be an [facility] provider

## 2023-04-19 NOTE — Telephone Encounter (Signed)
Pt referral placed

## 2023-05-24 ENCOUNTER — Encounter: Payer: Self-pay | Admitting: Family Medicine

## 2023-05-24 ENCOUNTER — Ambulatory Visit (INDEPENDENT_AMBULATORY_CARE_PROVIDER_SITE_OTHER): Payer: BLUE CROSS/BLUE SHIELD | Admitting: Family Medicine

## 2023-05-24 ENCOUNTER — Ambulatory Visit: Payer: BLUE CROSS/BLUE SHIELD | Admitting: Family Medicine

## 2023-05-24 VITALS — BP 148/80 | HR 79 | Temp 98.3°F | Ht 63.0 in | Wt 165.0 lb

## 2023-05-24 DIAGNOSIS — R051 Acute cough: Secondary | ICD-10-CM | POA: Diagnosis not present

## 2023-05-24 DIAGNOSIS — Z794 Long term (current) use of insulin: Secondary | ICD-10-CM

## 2023-05-24 DIAGNOSIS — J069 Acute upper respiratory infection, unspecified: Secondary | ICD-10-CM

## 2023-05-24 DIAGNOSIS — E1165 Type 2 diabetes mellitus with hyperglycemia: Secondary | ICD-10-CM | POA: Diagnosis not present

## 2023-05-24 DIAGNOSIS — Z7985 Long-term (current) use of injectable non-insulin antidiabetic drugs: Secondary | ICD-10-CM

## 2023-05-24 LAB — POC COVID19 BINAXNOW: SARS Coronavirus 2 Ag: NEGATIVE

## 2023-05-24 MED ORDER — DOXYCYCLINE HYCLATE 100 MG PO TABS
100.0000 mg | ORAL_TABLET | Freq: Two times a day (BID) | ORAL | 0 refills | Status: AC
Start: 2023-05-24 — End: 2023-05-31

## 2023-05-24 MED ORDER — TIRZEPATIDE 2.5 MG/0.5ML ~~LOC~~ SOAJ
2.5000 mg | SUBCUTANEOUS | 0 refills | Status: DC
Start: 2023-05-24 — End: 2023-06-14

## 2023-05-24 NOTE — Progress Notes (Signed)
Acute Office Visit   Subjective:  Patient ID: Yolanda Herrera, female    DOB: January 14, 1971, 52 y.o.   MRN: [id number]  Chief Complaint  Patient presents with   Cough   Sore Throat   Headache    Pt is here today with C/O Headache sore throat and cough Pt reports she has not COVID test. Sx started 05/14/2023 OTC-Robitussin,allergy med, Tylenol    Cough Associated symptoms include headaches.  Sore Throat  Associated symptoms include coughing and headaches.  Headache  Associated symptoms include coughing.   Patient is a 52 year old female that presents with a generalized headache, sore throat, fatigue, non productive cough, and nasal congestion/drainage.  Denies fever, ear pain or drainage, chest pain, SHOB, dizziness, or lightheadedness.   Symptoms started on 05/13/2022.   She reports she has took Robitussin, allergy medication, Tylenol, and Benadryl with no relief.   Denies any recent travels and sister has had some of the same symptoms after patient started symptoms.   At visit, patient reports she has not had her Trulicity because pharmacy as not had it in stock. Not had medication in the last 3 months.   Review of Systems  Respiratory:  Positive for cough.   Neurological:  Positive for headaches.   See HPI above      Objective:   BP (!) 148/80   Pulse 79   Temp 98.3 F (36.8 C)   Ht 5\' 3"  (1.6 m)   Wt 165 lb (74.8 kg)   LMP 01/18/2018 (Approximate)   SpO2 98%   BMI 29.23 kg/m    Physical Exam Vitals reviewed.  Constitutional:      General: She is not in acute distress.    Appearance: Normal appearance. She is not ill-appearing, toxic-appearing or diaphoretic.  HENT:     Head: Normocephalic and atraumatic.     Right Ear: Tympanic membrane and ear canal normal. There is no impacted cerumen.     Left Ear: Tympanic membrane and ear canal normal. There is no impacted cerumen.     Nose:     Right Sinus: No maxillary sinus tenderness or frontal sinus  tenderness.     Left Sinus: No maxillary sinus tenderness or frontal sinus tenderness.     Mouth/Throat:     Pharynx: Uvula midline. No pharyngeal swelling, oropharyngeal exudate, posterior oropharyngeal erythema or uvula swelling.  Eyes:     General:        Right eye: No discharge.        Left eye: No discharge.     Conjunctiva/sclera: Conjunctivae normal.  Cardiovascular:     Rate and Rhythm: Normal rate and regular rhythm.     Heart sounds: Normal heart sounds. No murmur heard.    No friction rub. No gallop.  Pulmonary:     Effort: Pulmonary effort is normal. No respiratory distress.     Breath sounds: Normal breath sounds.  Musculoskeletal:        General: Normal range of motion.  Lymphadenopathy:     Head:     Right side of head: No submental or submandibular adenopathy.     Left side of head: No submental or submandibular adenopathy.  Skin:    General: Skin is warm and dry.  Neurological:     General: No focal deficit present.     Mental Status: She is alert and oriented to person, place, and time. Mental status is at baseline.  Psychiatric:        Mood  and Affect: Mood normal.        Behavior: Behavior normal.        Thought Content: Thought content normal.        Judgment: Judgment normal.    Results for orders placed or performed in visit on 05/24/23  POC COVID-19  Result Value Ref Range   SARS Coronavirus 2 Ag Negative Negative      Assessment & Plan:  Acute cough -     POC COVID-19 BinaxNow  Type 2 diabetes mellitus with hyperglycemia, with long-term current use of insulin (HCC) -     Tirzepatide; Inject 2.5 mg into the skin once a week for 4 doses.  Dispense: 2 mL; Refill: 0  Upper respiratory infection, acute -     Doxycycline Hyclate; Take 1 tablet (100 mg total) by mouth 2 (two) times daily for 7 days.  Dispense: 14 tablet; Refill: 0  -Covid is negative.  -Prescribed Mounjaro 2.5mg  per weekly injection for 1 month for diabetes. Advised if she are  unable to obtain medication, please call back to the office or send a MyChart message. Ozempic has not been covered with insurance. We will follow back up with this in August at your chronic management appointment you already have.  -Prescribed Doxycycline 100mg  tablet, please take 1 tablet twice a day for 7 days for upper respiratory infection since she has had symptoms for 10 days.  -Advised that you can continue Robitussin and [employer] brand of Zyrtec if you would like. Recommend to not take Benadryl. -Hydrate with fluids.  -Recommend Vitamin C 1000mg , vitamin D3 1000IU, and Zinc 50-100 mg daily for 30 days to support your immune system.  -Follow up if not improved.   Zandra Abts, NP

## 2023-05-24 NOTE — Patient Instructions (Signed)
-  Prescribed Mounjaro 2.5mg  per weekly injection for 1 month for diabetes. If you are unable to obtain medication, please call back to the office or send a MyChart message. We will follow back up with this in August at your chronic management appointment you already have.  -Prescribed Doxycycline 100mg  tablet, please take 1 tablet twice a day for 7 days for upper respiratory infection since she has had symptoms for 10 days.  -Advised that you can continue Robitussin and [employer] brand of Zyrtec if you would like. Recommend to not take Benadryl. -Hydrate with fluids.  -Recommend Vitamin C 1000mg , vitamin D3 1000IU, and Zinc 50-100 mg daily for 30 days to support your immune system.  -Follow up if not improved.

## 2023-05-26 ENCOUNTER — Telehealth: Payer: Self-pay

## 2023-05-26 ENCOUNTER — Telehealth: Payer: Self-pay | Admitting: Pharmacy Technician

## 2023-05-26 ENCOUNTER — Other Ambulatory Visit (HOSPITAL_COMMUNITY): Payer: Self-pay

## 2023-05-26 ENCOUNTER — Other Ambulatory Visit: Payer: Self-pay

## 2023-05-26 DIAGNOSIS — K760 Fatty (change of) liver, not elsewhere classified: Secondary | ICD-10-CM

## 2023-05-26 NOTE — Telephone Encounter (Signed)
-----   Message from Yolanda Herrera sent at 05/26/2023 10:22 AM EDT ----- Please call the patient to let her know she has a fatty liver with ductal dilation. Recommend a GI referral for further workup. ----- Message ----- From: Arta Silence Sent: 05/24/2023   3:33 PM EDT To: Alveria Apley, NP

## 2023-05-26 NOTE — Telephone Encounter (Signed)
Pharmacy Patient Advocate Encounter   Received notification from CoverMyMeds that prior authorization for [facility] 2.5MG /0.5ML pen-injectors is required/requested.   Insurance verification completed.   The patient is insured through [facility] .   Per test claim: PA required; PA started via CoverMyMeds. KEY [id number] . Waiting for clinical questions to populate.

## 2023-05-26 NOTE — Telephone Encounter (Signed)
error 

## 2023-05-27 ENCOUNTER — Other Ambulatory Visit (HOSPITAL_COMMUNITY): Payer: Self-pay

## 2023-05-27 NOTE — Telephone Encounter (Signed)
Questions still pending

## 2023-05-27 NOTE — Telephone Encounter (Signed)
Clinical questions answered, PA submitted.

## 2023-05-27 NOTE — Telephone Encounter (Signed)
Pharmacy Patient Advocate Encounter  Received notification from [facility] that Prior Authorization for [facility] 2.5MG /0.5ML pen-injectors has been APPROVED from 05/27/23 to 05/26/24. Ran test claim, Copay is $373.93 (with eVoucher).  PA #/Case ID/Reference #: [id number]

## 2023-05-27 NOTE — Telephone Encounter (Signed)
Patient has been informed.

## 2023-05-31 ENCOUNTER — Ambulatory Visit: Payer: BLUE CROSS/BLUE SHIELD | Admitting: Gastroenterology

## 2023-06-01 ENCOUNTER — Encounter (INDEPENDENT_AMBULATORY_CARE_PROVIDER_SITE_OTHER): Payer: Self-pay

## 2023-06-08 NOTE — Progress Notes (Addendum)
Established Patient Office Visit   Subjective:  Patient ID: Yolanda Herrera, female    DOB: June 17, 1971  Age: 52 y.o. MRN: [id number]  Chief Complaint  Patient presents with   Follow-up    Pt is here to F/U Pt reports she has pain left side abdominal radiating down left leg . Describes dull and achy Sx started  06/12/2023 Pt is not fasting Pt reports she can not afford Mounjaro / her cost with PA $379.00.    HPI Hyperlipidemia: Chronic. Patient is taking Atrovastatin 20mg  every other day, which recommend by endocrinology. Denies any muscle aches, abd pain, nausea, or vomiting.  Lab Results  Component Value Date   CHOL 121 03/10/2023   HDL 32.60 (L) 03/10/2023   LDLCALC 67 03/10/2023   TRIG 108.0 03/10/2023   CHOLHDL 4 03/10/2023    GERD: Chronic. Patient is taking Omeprazole 20mg  as needed. Effective. No GERD symptoms. Usually has symptoms when eating spicy foods.    HTN: Chronic. Patient is taking Amlodipine 10mg  daily, Irbesartan 150mg  daily, and Metoprolol Succinate 100mg  daily. She reports she has not been taking Spironolactone 100mg  daily for months because she has not followed up with endocrinology. Patient denies monitors her blood pressure at home. Denies CP, SHOB, HA, dizziness, lightheadedness, or lower extremity edema. Blood pressure initially elevated today.  BP Readings from Last 3 Encounters:  06/14/23 (!) 142/92  05/24/23 (!) 148/80  03/08/23 (!) 140/80    [facility], Dr. Lamount Cranker [facility] is suppose to be managing her diabetes. She was suppose to follow up in April based on previous visit note in October 2023. Based on chart review, an appointment has not been completed since previous PCP in May. On her 05/24/2023 visit, patient reports she had not been taking Trulicity for the last 3 months because her pharmacy didn't have it in stock. Discontinued Trulicity and started on Mounjaro 2.5mg  per weekly injection at her last appointment. She reports  she never picked up the medication because of cost.  Ophthalmology exam-scheduled [facility] in [facility] at [facility]  Lab Results  Component Value Date   HGBA1C >14.0 (H) 03/10/2023    Patient is complaining of left sided abd/flank pain that started on Friday. Pain radiates to upper anterior thigh. Pain constant, described as pressure, dull, and achy. Denies nausea, vomiting, diarrhea, constipation, or fever. Increase urination, especially at night, for the last week or two.  ROS See HPI above     Objective:   BP (!) 142/92   Pulse 79   Temp 98.7 F (37.1 C)   Ht 5\' 3"  (1.6 m)   Wt 164 lb 6 oz (74.6 kg)   LMP 01/18/2018 (Approximate)   SpO2 98%   BMI 29.12 kg/m    Physical Exam Vitals reviewed.  Constitutional:      General: She is not in acute distress.    Appearance: Normal appearance. She is not ill-appearing, toxic-appearing or diaphoretic.  HENT:     Head: Normocephalic and atraumatic.  Eyes:     General:        Right eye: No discharge.        Left eye: No discharge.     Conjunctiva/sclera: Conjunctivae normal.  Cardiovascular:     Rate and Rhythm: Normal rate and regular rhythm.     Heart sounds: Normal heart sounds. No murmur heard.    No friction rub. No gallop.  Pulmonary:     Effort: Pulmonary effort is normal. No respiratory distress.  Breath sounds: Normal breath sounds.  Abdominal:     General: Bowel sounds are normal.     Tenderness: There is abdominal tenderness (Left lower and left flank pain). There is no right CVA tenderness or left CVA tenderness.  Musculoskeletal:        General: Normal range of motion.  Skin:    General: Skin is warm and dry.  Neurological:     General: No focal deficit present.     Mental Status: She is alert and oriented to person, place, and time. Mental status is at baseline.  Psychiatric:        Mood and Affect: Mood normal.        Behavior: Behavior normal.        Thought Content: Thought content  normal.        Judgment: Judgment normal.      Assessment & Plan:  Essential hypertension Assessment & Plan: Blood pressure is elevated. Refilled Irbesartan 150mg  daily, Metoprolol Succinate 100mg , Amlodipine 10mg , and Spironolactone 100mg . She has been out of Spironolactone for several months since she is has not followed up with endocrinologist. Since BP was elevated and not had medication, refilled until she can be seen by endocrinology. Recommend to monitor her blood pressure twice a day and bring in readings in 1 month. Ordered CMP to assess kidney and potassium level.   Orders: -     Comprehensive metabolic panel -     Irbesartan; Take 1 tablet (150 mg total) by mouth daily.  Dispense: 90 tablet; Refill: 0 -     Metoprolol Succinate ER; TAKE 1 TABLET BY MOUTH ONCE DAILY.TAKE WITH OR IMMEDIATELY FOLLOWING A MEAL.  Dispense: 90 tablet; Refill: 0 -     amLODIPine Besylate; Take 1 tablet (10 mg total) by mouth daily.  Dispense: 90 tablet; Refill: 0 -     Spironolactone; Take 1 tablet (100 mg total) by mouth daily for 90 doses.  Dispense: 90 tablet; Refill: 0  Gastroesophageal reflux disease, unspecified whether esophagitis present Assessment & Plan: Continue with Omeprazole 20mg  daily as needed since it is effective.    Hyperlipidemia, unspecified hyperlipidemia type Assessment & Plan: Continue to take Atrovastatin 20mg  every other day as recommended by endocrinology. Refilled medication. Ordered lipid panel and CMP to assess liver function. Patient has had orange juice prior to appointment.   Orders: -     Lipid panel -     Comprehensive metabolic panel -     Atorvastatin Calcium; Take 1 tablet (20 mg total) by mouth daily.  Dispense: 90 tablet; Refill: 0  Type 2 diabetes mellitus with hyperglycemia, with long-term current use of insulin (HCC) Assessment & Plan: Strongly encourage her to make an appointment with [facility], Dr. Lamount Cranker [facility] as soon as  possible. Prescribed Ozmepic 0.25mg  weekly injection for 4 weeks to help with diabetes management since she was unable to obtain Trulicity due to shortage of medication and the cost of Mounjaro. Continue with all other diabetic medication. Advised to contact the office she is unable to obtain medication, please call the office.   Orders: -     Hemoglobin A1c -     Comprehensive metabolic panel -     Semaglutide(0.25 or 0.5MG /DOS); Inject 0.25 mg into the skin every 7 (seven) days for 4 doses.  Dispense: 3 mL; Refill: 0  Cervical cancer screening -     Ambulatory referral to Obstetrics / Gynecology  Colon cancer screening -     Cologuard  Abdominal  pain, unspecified abdominal location -     POC Urinalysis Dipstick OB -     CBC with Differential/Platelet -     Lipase -     CT ABDOMEN PELVIS WO CONTRAST  Flank pain -     CT ABDOMEN PELVIS WO CONTRAST  Hematuria, unspecified type -     CT ABDOMEN PELVIS WO CONTRAST -     Urine Culture   1.Review health maintenance:  -Pap smear:Placed a referral to GYN, placed a referral at previous appointment by GYN at [facility] is not taking patients.  -Covid vaccine/booster: declines covid booster  -Influenza vaccine-will obtain later this season -Zoster vaccine-hold for the 1 month appointment since she is having abd pain today  -Cologuard-placed order for screening of colon/rectal cancer 2.Urinalysis showed glucose (uncontrolled diabetes), protein, and hematuria. Will send urine for a culture for confirmation.  -Ordered STAT CT abd/pelvis for left abd pain, left flank pain, and hematuria. Concerned about a possible kidney stone.  -Ordered CBC and Lipase for abd pain. CMP is already being drawn for other reasons.  Return in about 1 month (around 07/15/2023) for follow-up.   Zandra Abts, NP

## 2023-06-13 ENCOUNTER — Other Ambulatory Visit: Payer: Self-pay | Admitting: Family

## 2023-06-13 DIAGNOSIS — Z794 Long term (current) use of insulin: Secondary | ICD-10-CM

## 2023-06-13 DIAGNOSIS — I10 Essential (primary) hypertension: Secondary | ICD-10-CM

## 2023-06-14 ENCOUNTER — Telehealth: Payer: Self-pay | Admitting: Family Medicine

## 2023-06-14 ENCOUNTER — Other Ambulatory Visit: Payer: BLUE CROSS/BLUE SHIELD

## 2023-06-14 ENCOUNTER — Ambulatory Visit: Payer: BLUE CROSS/BLUE SHIELD | Admitting: Family Medicine

## 2023-06-14 ENCOUNTER — Telehealth: Payer: Self-pay

## 2023-06-14 VITALS — BP 142/92 | HR 79 | Temp 98.7°F | Ht 63.0 in | Wt 164.4 lb

## 2023-06-14 DIAGNOSIS — R319 Hematuria, unspecified: Secondary | ICD-10-CM

## 2023-06-14 DIAGNOSIS — E1165 Type 2 diabetes mellitus with hyperglycemia: Secondary | ICD-10-CM

## 2023-06-14 DIAGNOSIS — Z794 Long term (current) use of insulin: Secondary | ICD-10-CM | POA: Diagnosis not present

## 2023-06-14 DIAGNOSIS — R109 Unspecified abdominal pain: Secondary | ICD-10-CM

## 2023-06-14 DIAGNOSIS — Z1211 Encounter for screening for malignant neoplasm of colon: Secondary | ICD-10-CM

## 2023-06-14 DIAGNOSIS — Z124 Encounter for screening for malignant neoplasm of cervix: Secondary | ICD-10-CM

## 2023-06-14 DIAGNOSIS — K219 Gastro-esophageal reflux disease without esophagitis: Secondary | ICD-10-CM

## 2023-06-14 DIAGNOSIS — Z23 Encounter for immunization: Secondary | ICD-10-CM

## 2023-06-14 DIAGNOSIS — I10 Essential (primary) hypertension: Secondary | ICD-10-CM | POA: Diagnosis not present

## 2023-06-14 DIAGNOSIS — E785 Hyperlipidemia, unspecified: Secondary | ICD-10-CM

## 2023-06-14 LAB — LIPID PANEL
Cholesterol: 142 mg/dL (ref 0–200)
HDL: 44.5 mg/dL (ref >39.00)
LDL Cholesterol: 84 mg/dL (ref 0–99)
NonHDL: 97.6
Total CHOL/HDL Ratio: 3
Triglycerides: 67 mg/dL (ref 0.0–149.0)
VLDL: 13.4 mg/dL (ref 0.0–40.0)

## 2023-06-14 LAB — CBC WITH DIFFERENTIAL/PLATELET
Basophils Absolute: 0 10*3/uL (ref 0.0–0.1)
Basophils Relative: 0.3 % (ref 0.0–3.0)
Eosinophils Absolute: 0.1 10*3/uL (ref 0.0–0.7)
Eosinophils Relative: 1 % (ref 0.0–5.0)
HCT: 44.4 % (ref 36.0–46.0)
Hemoglobin: 13.7 g/dL (ref 12.0–15.0)
Lymphocytes Relative: 33.8 % (ref 12.0–46.0)
Lymphs Abs: 2.3 10*3/uL (ref 0.7–4.0)
MCHC: 30.8 g/dL (ref 30.0–36.0)
MCV: 74 fl — ABNORMAL LOW (ref 78.0–100.0)
Monocytes Absolute: 0.4 10*3/uL (ref 0.1–1.0)
Monocytes Relative: 5.4 % (ref 3.0–12.0)
Neutro Abs: 4 10*3/uL (ref 1.4–7.7)
Neutrophils Relative %: 59.5 % (ref 43.0–77.0)
Platelets: 218 10*3/uL (ref 150.0–400.0)
RBC: 6 Mil/uL — ABNORMAL HIGH (ref 3.87–5.11)
RDW: 17.3 % — ABNORMAL HIGH (ref 11.5–15.5)
WBC: 6.8 10*3/uL (ref 4.0–10.5)

## 2023-06-14 LAB — COMPREHENSIVE METABOLIC PANEL
ALT: 29 U/L (ref 0–35)
AST: 21 U/L (ref 0–37)
Albumin: 4.3 g/dL (ref 3.5–5.2)
Alkaline Phosphatase: 94 U/L (ref 39–117)
BUN: 11 mg/dL (ref 6–23)
CO2: 29 mEq/L (ref 19–32)
Calcium: 9.3 mg/dL (ref 8.4–10.5)
Chloride: 98 mEq/L (ref 96–112)
Creatinine, Ser: 0.93 mg/dL (ref 0.40–1.20)
GFR: 70.43 mL/min (ref >60.00)
Glucose, Bld: 355 mg/dL — ABNORMAL HIGH (ref 70–99)
Potassium: 4.2 mEq/L (ref 3.5–5.1)
Sodium: 135 mEq/L (ref 135–145)
Total Bilirubin: 0.4 mg/dL (ref 0.2–1.2)
Total Protein: 7.5 g/dL (ref 6.0–8.3)

## 2023-06-14 LAB — POCT URINALYSIS DIPSTICK OB
Bilirubin, UA: NEGATIVE
Glucose, UA: NEGATIVE
Ketones, UA: NEGATIVE
Leukocytes, UA: NEGATIVE
Nitrite, UA: NEGATIVE
Spec Grav, UA: 1.01 (ref 1.010–1.025)
Urobilinogen, UA: 0.2 E.U./dL
pH, UA: 6 (ref 5.0–8.0)

## 2023-06-14 LAB — LIPASE: Lipase: 34 U/L (ref 11.0–59.0)

## 2023-06-14 MED ORDER — IRBESARTAN 150 MG PO TABS
150.0000 mg | ORAL_TABLET | Freq: Every day | ORAL | 0 refills | Status: DC
Start: 2023-06-14 — End: 2023-10-05

## 2023-06-14 MED ORDER — METOPROLOL SUCCINATE ER 100 MG PO TB24
ORAL_TABLET | ORAL | 0 refills | Status: DC
Start: 2023-06-14 — End: 2024-01-27

## 2023-06-14 MED ORDER — SPIRONOLACTONE 100 MG PO TABS
100.0000 mg | ORAL_TABLET | Freq: Every day | ORAL | 0 refills | Status: DC
Start: 2023-06-14 — End: 2023-07-21

## 2023-06-14 MED ORDER — AMLODIPINE BESYLATE 10 MG PO TABS
10.0000 mg | ORAL_TABLET | Freq: Every day | ORAL | 0 refills | Status: DC
Start: 2023-06-14 — End: 2023-07-21

## 2023-06-14 MED ORDER — ATORVASTATIN CALCIUM 20 MG PO TABS
20.0000 mg | ORAL_TABLET | Freq: Every day | ORAL | 0 refills | Status: DC
Start: 2023-06-14 — End: 2023-12-23

## 2023-06-14 MED ORDER — SEMAGLUTIDE(0.25 OR 0.5MG/DOS) 2 MG/3ML ~~LOC~~ SOPN
0.2500 mg | PEN_INJECTOR | SUBCUTANEOUS | 0 refills | Status: AC
Start: 2023-06-14 — End: 2023-07-06

## 2023-06-14 MED ORDER — METHOCARBAMOL 500 MG PO TABS: 500.0000 mg | ORAL_TABLET | Freq: Three times a day (TID) | ORAL | 0 refills | Status: DC | PRN

## 2023-06-14 NOTE — Telephone Encounter (Signed)
Caller name: Daryl Deslauriers  On DPR?: Yes  Call back number: [phone] (mobile)  Provider they see: Alveria Apley, NP  Reason for call:  Pt had a VM from our office and was calling back. She asked for a return call.

## 2023-06-14 NOTE — Assessment & Plan Note (Addendum)
Continue to take Atrovastatin 20mg  every other day as recommended by endocrinology. Refilled medication. Ordered lipid panel and CMP to assess liver function. Patient has had orange juice prior to appointment.

## 2023-06-14 NOTE — Telephone Encounter (Signed)
Pt notes she will try medication Robaxin, notes she has tried [place] previously but had to stop due to insurance pref she is willing to try this again and try to get it covered?

## 2023-06-14 NOTE — Telephone Encounter (Signed)
[facility] will send reports. Per Mardene Celeste

## 2023-06-14 NOTE — Assessment & Plan Note (Signed)
Continue with Omeprazole 20mg  daily as needed since it is effective.

## 2023-06-14 NOTE — Addendum Note (Signed)
Addended by: Eldred Manges on: 06/14/2023 05:01 PM   Modules accepted: Orders

## 2023-06-14 NOTE — Patient Instructions (Addendum)
-  Urinalysis showed glucose (uncontrolled diabetes), protein, and hematuria. Will send urine for a culture for confirmation.  -Ordered STAT CT abd/pelvis for left abd pain, left flank pain, and hematuria. Concerned about a possible kidney stone.  -Ordered labs for reason of abd/flank pain, and for chronic disease. Office will call with lab results and you may see them in MyChart.  -Refilled Irbesartan, Metoprolol, Amlodipine, and Spironolactone. Blood pressure is elevated today. Recommend to monitor your blood pressure twice a day and bring in readings in 1 month.  -Refilled Atorvastatin for hyperlipidemia.  -Strongly encourage you to make an appointment with [facility], Dr. Lamount Cranker [facility] as soon as possible. Prescribed Ozmepic 0.25mg  weekly injection for 4 weeks to help with diabetes management since you were unable to obtain Trulicity due to shortage of medication and the cost of Mounjaro. If you are unable to obtain medication, please call the office.  -Follow up in 1 month.

## 2023-06-14 NOTE — Assessment & Plan Note (Addendum)
Blood pressure is elevated. Refilled Irbesartan 150mg  daily, Metoprolol Succinate 100mg , Amlodipine 10mg , and Spironolactone 100mg . She has been out of Spironolactone for several months since she is has not followed up with endocrinologist. Since BP was elevated and not had medication, refilled until she can be seen by endocrinology. Recommend to monitor her blood pressure twice a day and bring in readings in 1 month. Ordered CMP to assess kidney and potassium level.

## 2023-06-14 NOTE — Telephone Encounter (Signed)
Sent Robaxin per instructions with quantity of 21 per verbal from [place]

## 2023-06-14 NOTE — Assessment & Plan Note (Signed)
Strongly encourage her to make an appointment with [facility], Dr. Lamount Cranker [facility] as soon as possible. Prescribed Ozmepic 0.25mg  weekly injection for 4 weeks to help with diabetes management since she was unable to obtain Trulicity due to shortage of medication and the cost of Mounjaro. Continue with all other diabetic medication. Advised to contact the office she is unable to obtain medication, please call the office.

## 2023-06-15 ENCOUNTER — Other Ambulatory Visit: Payer: Self-pay

## 2023-06-15 ENCOUNTER — Other Ambulatory Visit (INDEPENDENT_AMBULATORY_CARE_PROVIDER_SITE_OTHER): Payer: BLUE CROSS/BLUE SHIELD

## 2023-06-15 ENCOUNTER — Telehealth: Payer: Self-pay

## 2023-06-15 DIAGNOSIS — R7989 Other specified abnormal findings of blood chemistry: Secondary | ICD-10-CM

## 2023-06-15 LAB — IBC PANEL
Iron: 50 ug/dL (ref 42–145)
Saturation Ratios: 13.6 % — ABNORMAL LOW (ref 20.0–50.0)
TIBC: 366.8 ug/dL (ref 250.0–450.0)
Transferrin: 262 mg/dL (ref 212.0–360.0)

## 2023-06-15 NOTE — Telephone Encounter (Signed)
Pt called back. °

## 2023-06-15 NOTE — Telephone Encounter (Signed)
Left vm to call office so this can be talked about.

## 2023-06-15 NOTE — Telephone Encounter (Signed)
-----   Message from Zandra Abts sent at 06/15/2023  8:15 AM EDT ----- Your MCV is low and RDW is elevated, recommend to add on an iron panel to the lab to make sure you are not anemic. (Lab: Iron Panel Dx: Abnormal CBC) If this test can not be added, recommend to come in for a lab visit to have it drawn. Cholesterol looks good, continue with taking Atorvastatin. All other labs are stable.

## 2023-06-15 NOTE — Telephone Encounter (Signed)
Pt have been notified. Lab add on was faxed

## 2023-06-16 ENCOUNTER — Telehealth: Payer: Self-pay

## 2023-06-16 ENCOUNTER — Encounter (INDEPENDENT_AMBULATORY_CARE_PROVIDER_SITE_OTHER): Payer: Self-pay

## 2023-06-16 ENCOUNTER — Other Ambulatory Visit: Payer: Self-pay

## 2023-06-16 DIAGNOSIS — R319 Hematuria, unspecified: Secondary | ICD-10-CM

## 2023-06-16 NOTE — Telephone Encounter (Signed)
Left pt a VM to call office in regards to lab results

## 2023-06-16 NOTE — Telephone Encounter (Signed)
-----   Message from Zandra Abts sent at 06/16/2023  7:51 AM EDT -----  Your urine culture came back with less than 10,000 organisms, which means no infection. Recommend a referral to urology for the blood that was found in your urinalysis.

## 2023-06-16 NOTE — Telephone Encounter (Signed)
-----   Message from Zandra Abts sent at 06/16/2023  4:18 PM EDT ----- Your A1c is still above 14. Please decrease your carbohydrates and sweets. We are working on starting [employer] along with your other medication. However, I strongly encourage you to make an appointment with your endocrinologist. Also, would you like me to place a referral for diabetic nutrition/diet teaching?

## 2023-06-16 NOTE — Telephone Encounter (Signed)
-----   Message from Zandra Abts sent at 06/16/2023  7:52 AM EDT ----- Iron panel is stable.

## 2023-06-17 ENCOUNTER — Other Ambulatory Visit: Payer: Self-pay

## 2023-06-17 DIAGNOSIS — Z794 Long term (current) use of insulin: Secondary | ICD-10-CM

## 2023-06-17 MED ORDER — EMPAGLIFLOZIN 10 MG PO TABS
10.0000 mg | ORAL_TABLET | Freq: Every day | ORAL | 0 refills | Status: DC
Start: 2023-06-17 — End: 2023-07-14

## 2023-06-17 NOTE — Telephone Encounter (Signed)
Pt can not take Ozempic due to cost. She refused diabetic nutrition/diet teaching. She would like a different medication. Pt is going to call endo. To make appt.

## 2023-06-17 NOTE — Telephone Encounter (Signed)
Pt agrees to Jardiance 10mg - This has been sent to pharmacy.

## 2023-06-17 NOTE — Telephone Encounter (Signed)
Left vm

## 2023-06-17 NOTE — Telephone Encounter (Signed)
Caller name: Kristene Greenan  On DPR?: Yes  Call back number: [phone] (mobile)  Provider they see: Alveria Apley, NP  Reason for call:  Pt has returned VM, asking for a return phone call to receive lab results

## 2023-06-20 ENCOUNTER — Telehealth: Payer: Self-pay | Admitting: Family Medicine

## 2023-06-20 NOTE — Telephone Encounter (Signed)
Patient cannot afford her  empagliflozin (JARDIANCE) 10 MG TABS tablet  wondering what is the next best option or what to do , please advise

## 2023-06-20 NOTE — Telephone Encounter (Signed)
Called pt and asked her to call her insurance to see what is covered. Sent to [employer] for [employer]

## 2023-06-21 NOTE — Telephone Encounter (Signed)
Pt is making appt today with them. Pt is calling insurance today. Pt will call us today and let us know.

## 2023-06-21 NOTE — Telephone Encounter (Signed)
Pt is going to call Endo this morning to make appointment.

## 2023-06-30 ENCOUNTER — Other Ambulatory Visit: Payer: Self-pay

## 2023-06-30 ENCOUNTER — Telehealth: Payer: Self-pay

## 2023-06-30 DIAGNOSIS — Z111 Encounter for screening for respiratory tuberculosis: Secondary | ICD-10-CM

## 2023-06-30 NOTE — Telephone Encounter (Signed)
noted 

## 2023-06-30 NOTE — Telephone Encounter (Signed)
Pt has form from school she will drop off Pt called made appt for lab only for TB screening Pt will pick up immunization record- placed in front cabinet

## 2023-07-06 ENCOUNTER — Other Ambulatory Visit: Payer: BLUE CROSS/BLUE SHIELD

## 2023-07-06 DIAGNOSIS — Z111 Encounter for screening for respiratory tuberculosis: Secondary | ICD-10-CM

## 2023-07-07 ENCOUNTER — Telehealth: Payer: Self-pay

## 2023-07-07 NOTE — Telephone Encounter (Signed)
Appt has been made.

## 2023-07-08 ENCOUNTER — Encounter: Payer: Self-pay | Admitting: Pharmacist

## 2023-07-08 LAB — QUANTIFERON-TB GOLD PLUS
Mitogen-NIL: >10 [IU]/mL
NIL: 0.04 [IU]/mL
QuantiFERON-TB Gold Plus: NEGATIVE
TB1-NIL: <0 [IU]/mL
TB2-NIL: 0.01 [IU]/mL

## 2023-07-11 ENCOUNTER — Telehealth: Payer: Self-pay

## 2023-07-11 NOTE — Telephone Encounter (Signed)
-----   Message from Zandra Abts sent at 07/08/2023  4:59 PM EDT ----- TB is negative.

## 2023-07-14 ENCOUNTER — Encounter: Payer: Self-pay | Admitting: Family Medicine

## 2023-07-14 ENCOUNTER — Ambulatory Visit (INDEPENDENT_AMBULATORY_CARE_PROVIDER_SITE_OTHER): Payer: BLUE CROSS/BLUE SHIELD | Admitting: Family Medicine

## 2023-07-14 VITALS — BP 124/74 | HR 72 | Temp 97.8°F | Ht 63.0 in | Wt 159.4 lb

## 2023-07-14 DIAGNOSIS — E1165 Type 2 diabetes mellitus with hyperglycemia: Secondary | ICD-10-CM | POA: Diagnosis not present

## 2023-07-14 DIAGNOSIS — Z794 Long term (current) use of insulin: Secondary | ICD-10-CM | POA: Diagnosis not present

## 2023-07-14 DIAGNOSIS — Z0000 Encounter for general adult medical examination without abnormal findings: Secondary | ICD-10-CM

## 2023-07-14 DIAGNOSIS — Z23 Encounter for immunization: Secondary | ICD-10-CM | POA: Diagnosis not present

## 2023-07-14 DIAGNOSIS — Z020 Encounter for examination for admission to educational institution: Secondary | ICD-10-CM

## 2023-07-14 MED ORDER — HUMALOG KWIKPEN 100 UNIT/ML ~~LOC~~ SOPN
10.0000 [IU] | PEN_INJECTOR | Freq: Three times a day (TID) | SUBCUTANEOUS | 1 refills | Status: DC
Start: 2023-07-14 — End: 2023-11-23

## 2023-07-14 NOTE — Progress Notes (Signed)
Complete physical exam and school exam   Patient: Yolanda Herrera   DOB: 1971-07-29   52 y.o. Female  MRN: [id number]  Subjective:    Chief Complaint  Patient presents with   Annual Exam    Pt is here for annual Exam- Pt needs for college Pt has no concerns    Yolanda Herrera is a 52 y.o. female who presents today for a complete physical exam. She reports consuming a low sodium, low carbohydrate diet. The patient does not participate in regular exercise at present. She generally feels fairly well. She reports sleeping well. She does not have additional problems to discuss today.   Most recent fall risk assessment:    07/14/2023    9:04 AM  Fall Risk   Falls in the past year? 0  Number falls in past yr: 0  Injury with Fall? 0  Risk for fall due to : No Fall Risks  Follow up Falls evaluation completed   Most recent depression screenings:    05/24/2023    2:44 PM 03/08/2023   10:25 AM  PHQ 2/9 Scores  PHQ - 2 Score 0 0  PHQ- 9 Score 2 1   Vision:Not within last year  and Dental: No current dental problems and No regular dental care   Patient Active Problem List   Diagnosis Date Noted   Encounter for screening for other metabolic disorders 03/08/2023   Gastroesophageal reflux disease 06/02/2022   Essential hypertension 04/11/2020   Type 2 diabetes mellitus with hyperglycemia, with long-term current use of insulin (HCC) 01/11/2020   Type 2 diabetes mellitus with retinopathy of right eye, with long-term current use of insulin (HCC) 01/11/2020   Dyslipidemia 01/11/2020   Type 2 diabetes mellitus without complication, with long-term current use of insulin (HCC) 02/01/2018   Resistant hypertension 02/01/2018   Hyperlipidemia 02/01/2018   Past Medical History:  Diagnosis Date   Diabetes mellitus without complication (HCC)    GERD (gastroesophageal reflux disease)    Hyperlipidemia    Hypertension    Past Surgical History:  Procedure Laterality Date   CESAREAN  SECTION     heart echo     UTERINE FIBROID SURGERY     Social History   Tobacco Use   Smoking status: Never   Smokeless tobacco: Never  Vaping Use   Vaping status: Never Used  Substance Use Topics   Alcohol use: No   Drug use: Never   Social History   Socioeconomic History   Marital status: Divorced    Spouse name: Not on file   Number of children: 1   Years of education: Not on file   Highest education level: Associate degree: academic program  Occupational History   Occupation: Consulting civil engineer    Occupation: Behavior Health  Tobacco Use   Smoking status: Never   Smokeless tobacco: Never  Vaping Use   Vaping status: Never Used  Substance and Sexual Activity   Alcohol use: No   Drug use: Never   Sexual activity: Not Currently  Other Topics Concern   Not on file  Social History Narrative   Pt is from [place]. Moved here in 2001. 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    Social Determinants of Health   Financial Resource Strain: Low Risk  (03/08/2023)   Overall Financial Resource Strain (CARDIA)    Difficulty of Paying Living Expenses: Not very hard  Food Insecurity: No Food Insecurity (03/08/2023)   Hunger Vital  Sign    Worried About Programme researcher, broadcasting/film/video in the Last Year: Never true    Ran Out of Food in the Last Year: Never true  Transportation Needs: No Transportation Needs (03/08/2023)   PRAPARE - Administrator, Civil Service (Medical): No    Lack of Transportation (Non-Medical): No  Physical Activity: Inactive (03/08/2023)   Exercise Vital Sign    Days of Exercise per Week: 0 days    Minutes of Exercise per Session: 0 min  Stress: No Stress Concern Present (03/08/2023)   [employer] of Occupational Health - Occupational Stress Questionnaire    Feeling of Stress : Not at all  Social Connections: Moderately Integrated (03/08/2023)   Social Connection and Isolation Panel [NHANES]    Frequency of Communication with Friends and Family:  Twice a week    Frequency of Social Gatherings with Friends and Family: Once a week    Attends Religious Services: More than 4 times per year    Active Member of [employer] or Organizations: Yes    Attends Banker Meetings: More than 4 times per year    Marital Status: Divorced  Intimate Partner Violence: Not At Risk (03/08/2023)   Humiliation, Afraid, Rape, and Kick questionnaire    Fear of Current or Ex-Partner: No    Emotionally Abused: No    Physically Abused: No    Sexually Abused: No   Family Status  Relation Name Status   Mother  Alive   Father  Deceased   Sister  Alive   Brother nicholas Alive   Brother  Alive   Brother  Alive   Son Ramon Dredge Alive   MGM  Deceased   MGF  Deceased   PGM  Deceased   PGF  Deceased  No partnership data on file   Family History  Problem Relation Age of Onset   Diabetes Mother    Diabetes Father    Kidney disease Father    Hypertension Father    Hypertension Sister    Stroke Sister    Diabetes Brother    Eczema Son    No Known Allergies   Patient Care Team: Alveria Apley, NP as PCP - General (Family Medicine) Shamleffer, Konrad Dolores, MD as Attending Physician (Endocrinology)  Burgin, Wayna Chalet, MD as GYN with [facility] (Has appointment on 08/09/2023)   Outpatient Medications Prior to Visit  Medication Sig   amLODipine (NORVASC) 10 MG tablet Take 1 tablet (10 mg total) by mouth daily.   atorvastatin (LIPITOR) 20 MG tablet Take 1 tablet (20 mg total) by mouth daily.   blood glucose meter kit and supplies per insurance preference. Use up to three times daily as directed. E1.9, Z79.4 Please dispense lancets and glucose test stripes to match glucometer, 100 each   glucose blood (ONETOUCH VERIO) test strip 1 each by Other route 3 (three) times daily. Use as instructed   Insulin Glargine (BASAGLAR KWIKPEN) 100 UNIT/ML Inject 40 Units into the skin daily.   Insulin Pen Needle  (BD PEN NEEDLE NANO 2ND GEN) 32G X 4 MM MISC USE TO INJECT INSULIN IN THE MORNING, AT NOON, IN THE EVENING AND AT BEDTIME.   irbesartan (AVAPRO) 150 MG tablet Take 1 tablet (150 mg total) by mouth daily.   metFORMIN (GLUCOPHAGE XR) 500 MG 24 hr tablet Take 1 tablet (500 mg total) by mouth in the morning and at bedtime.   methocarbamol (ROBAXIN) 500 MG tablet Take 1  tablet (500 mg total) by mouth every 8 (eight) hours as needed for muscle spasms.   metoprolol succinate (TOPROL-XL) 100 MG 24 hr tablet TAKE 1 TABLET BY MOUTH ONCE DAILY.TAKE WITH OR IMMEDIATELY FOLLOWING A MEAL.   omeprazole (PRILOSEC) 20 MG capsule Take 1 capsule (20 mg total) by mouth 2 (two) times daily before a meal.   spironolactone (ALDACTONE) 100 MG tablet Take 1 tablet (100 mg total) by mouth daily for 90 doses.   [DISCONTINUED] HUMALOG KWIKPEN 100 UNIT/ML KwikPen SMARTSIG:10 Unit(s) SUB-Q 3 Times Daily   [DISCONTINUED] empagliflozin (JARDIANCE) 10 MG TABS tablet Take 1 tablet (10 mg total) by mouth daily. (Patient not taking: Reported on 07/14/2023)   No facility-administered medications prior to visit.    Review of Systems  Constitutional: Negative.   HENT: Negative.    Eyes:  Positive for blurred vision (has eye appt on Sept 25).  Respiratory: Negative.    Cardiovascular: Negative.   Gastrointestinal: Negative.   Genitourinary:  Positive for frequency.  Musculoskeletal: Negative.   Skin: Negative.   Neurological: Negative.   Psychiatric/Behavioral: Negative.        Objective:   BP 124/74   Pulse 72   Temp 97.8 F (36.6 C)   Ht 5\' 3"  (1.6 m)   Wt 159 lb 6 oz (72.3 kg)   LMP 01/18/2018 (Approximate)   SpO2 99%   BMI 28.23 kg/m    Physical Exam Constitutional:      General: She is awake.     Appearance: Normal appearance. She is overweight. She is not ill-appearing.  HENT:     Head: Normocephalic and atraumatic.     Right Ear: Tympanic membrane, ear canal and external ear normal.     Left Ear:  Tympanic membrane, ear canal and external ear normal.     Nose: Nose normal.     Mouth/Throat:     Mouth: Mucous membranes are moist.  Eyes:     General: Lids are normal.     Conjunctiva/sclera: Conjunctivae normal.     Comments: Wears glasses  Neck:     Trachea: Trachea normal.  Cardiovascular:     Rate and Rhythm: Normal rate and regular rhythm.     Pulses: Normal pulses.          Dorsalis pedis pulses are 2+ on the right side and 2+ on the left side.       Posterior tibial pulses are 2+ on the right side and 2+ on the left side.     Heart sounds: Normal heart sounds.  Pulmonary:     Effort: Pulmonary effort is normal. No respiratory distress.     Breath sounds: Normal breath sounds.  Abdominal:     General: Bowel sounds are normal. There is no distension.     Palpations: Abdomen is soft. There is no mass.     Tenderness: There is no abdominal tenderness.  Musculoskeletal:        General: Normal range of motion.     Cervical back: Normal range of motion and neck supple. No edema.  Skin:    General: Skin is warm and dry.  Neurological:     General: No focal deficit present.     Mental Status: She is alert and oriented to person, place, and time. Mental status is at baseline.  Psychiatric:        Mood and Affect: Mood normal.        Behavior: Behavior normal. Behavior is cooperative.  Thought Content: Thought content normal.        Judgment: Judgment normal.      Assessment & Plan:    Routine Health Maintenance and Physical Exam  Immunization History  Administered Date(s) Administered   Influenza Inj Mdck Quad Pf 10/18/2017   Influenza, Seasonal, Injecte, Preservative Fre 07/14/2023   Influenza,inj,Quad PF,6+ Mos 07/23/2018   Influenza-Unspecified 09/10/2020   Moderna Sars-Covid-2 Vaccination 11/22/2019, 12/20/2019, 10/09/2020   PPD Test 05/25/2022   Pneumococcal Polysaccharide-23 06/06/2018   Tdap 06/13/2020   Zoster Recombinant(Shingrix) 03/08/2023     Health Maintenance  Topic Date Due   OPHTHALMOLOGY EXAM  12/06/2020   PAP SMEAR-Modifier  02/27/2021   Zoster Vaccines- Shingrix (2 of 2) 05/03/2023   COVID-19 Vaccine (4 - 2023-24 season) 07/03/2023   Colonoscopy  12/01/2023 (Originally 06/21/2015)   Diabetic kidney evaluation - Urine ACR  12/01/2023   HEMOGLOBIN A1C  12/15/2023   FOOT EXAM  04/18/2024   Diabetic kidney evaluation - eGFR measurement  06/13/2024   MAMMOGRAM  03/16/2025   DTaP/Tdap/Td (2 - Td or Tdap) 06/13/2030   INFLUENZA VACCINE  Completed   Hepatitis C Screening  Completed   HIV Screening  Completed   HPV VACCINES  Aged Out   Discussed health benefits of physical activity, and encouraged her to engage in regular exercise appropriate for her age and condition.  Need for influenza vaccination -     Flu vaccine trivalent PF, 6mos and older(Flulaval,Afluria,Fluarix,Fluzone)  Annual physical exam  School physical exam  Type 2 diabetes mellitus with hyperglycemia, with long-term current use of insulin (HCC) -     HumaLOG KwikPen; Inject 10 Units into the skin 3 (three) times daily.  Dispense: 15 mL; Refill: 1   1.Review health maintenance:  -Covid vaccine/booster: Initial covid vaccine, declines booster -Influenza vaccine: Ordered and administered -Ophthalmology exam: Has appt on 07/27/2023 -Pap smear: Has GYN appointment 08/09/2023 -Zoster vaccine: Needs 2nd vaccine; will get tomorrow at chronic management appointment  2.Physical exam completed. No concerns.  3.Completed school forms, a copy has been made for her chart, and forms have been provided back to patient.  4.Continue to work on a healthy diet and start incorporating regular exercise routine such as walking 30 minutes a day to every other day.  5.Continue all prescribed medications and follow up with appointments already scheduled.  6.Refilled insulin for diabetes. Patient does have an endocrinology appointment next week.  7.Has chronic  management appointment tomorrow.    Zandra Abts, NP

## 2023-07-14 NOTE — Patient Instructions (Addendum)
-  Physical exam completed. No concerns.  -Completed school forms.  -Influenza vaccine given today.  -Continue to work on a healthy diet and start incorporating regular exercise routine such as walking 30 minutes a day to every other day.  -Continue all prescribed medications and follow up with appointments already scheduled.  -See you tomorrow for chronic management.  -Refilled insulin for diabetes.

## 2023-07-15 ENCOUNTER — Ambulatory Visit: Payer: BLUE CROSS/BLUE SHIELD | Admitting: Family Medicine

## 2023-07-15 ENCOUNTER — Telehealth: Payer: Self-pay

## 2023-07-15 ENCOUNTER — Ambulatory Visit (INDEPENDENT_AMBULATORY_CARE_PROVIDER_SITE_OTHER): Payer: BLUE CROSS/BLUE SHIELD | Admitting: Family Medicine

## 2023-07-15 ENCOUNTER — Other Ambulatory Visit (HOSPITAL_BASED_OUTPATIENT_CLINIC_OR_DEPARTMENT_OTHER): Payer: Self-pay

## 2023-07-15 VITALS — BP 128/72 | HR 71 | Temp 97.8°F | Wt 160.1 lb

## 2023-07-15 DIAGNOSIS — I10 Essential (primary) hypertension: Secondary | ICD-10-CM | POA: Diagnosis not present

## 2023-07-15 DIAGNOSIS — Z23 Encounter for immunization: Secondary | ICD-10-CM

## 2023-07-15 DIAGNOSIS — M792 Neuralgia and neuritis, unspecified: Secondary | ICD-10-CM | POA: Diagnosis not present

## 2023-07-15 DIAGNOSIS — M25552 Pain in left hip: Secondary | ICD-10-CM | POA: Diagnosis not present

## 2023-07-15 DIAGNOSIS — Z794 Long term (current) use of insulin: Secondary | ICD-10-CM

## 2023-07-15 DIAGNOSIS — E1165 Type 2 diabetes mellitus with hyperglycemia: Secondary | ICD-10-CM | POA: Diagnosis not present

## 2023-07-15 DIAGNOSIS — M545 Low back pain, unspecified: Secondary | ICD-10-CM

## 2023-07-15 MED ORDER — TRULICITY 0.75 MG/0.5ML ~~LOC~~ SOAJ
0.7500 mg | SUBCUTANEOUS | 0 refills | Status: DC
Start: 2023-07-15 — End: 2023-07-15

## 2023-07-15 MED ORDER — TRULICITY 0.75 MG/0.5ML ~~LOC~~ SOPN
0.7500 mg | PEN_INJECTOR | SUBCUTANEOUS | 0 refills | Status: AC
Start: 2023-07-15 — End: 2023-08-12
  Filled 2023-07-15: qty 2, 28d supply, fill #0

## 2023-07-15 MED ORDER — GABAPENTIN 100 MG PO CAPS
100.0000 mg | ORAL_CAPSULE | Freq: Three times a day (TID) | ORAL | 0 refills | Status: AC
Start: 2023-07-15 — End: ?
  Filled 2023-07-15: qty 90, 30d supply, fill #0

## 2023-07-15 NOTE — Progress Notes (Signed)
Established Patient Office Visit   Subjective:  Patient ID: Yolanda Herrera, female    DOB: 06/10/1971  Age: 52 y.o. MRN: [id number]  Chief Complaint  Patient presents with   Follow-up    Pt is here today to F/U. Pt is getting shingles shot today in office.No refills today. Pt reports left hip pain, tender to touch and sharp pain with touch. Pt reports cough in lower abdominal sharp pain.     HPI HTN: Chronic. Patient is taking Amlodipine 10mg  daily, Irbesartan 150mg  daily, and Metoprolol Succinate 100mg  daily. On previous appointment, refilled Spironolactone 100mg  that she had been out. Denies CP, SHOB, headache, lightheadedness, dizziness, or lower extremity.  BP Readings from Last 3 Encounters:  07/15/23 128/72  07/14/23 124/74  06/14/23 (!) 142/92    Diabetes: Chronic. Patient has finally made an appointment with [facility] End[facility]inology, Dr. Landry Mellow [facility], on next Thursday, 07/21/2023. She is taking Humalog 10 U TID, Insulin Glargine 40U daily, and Metformin 500mg  BID. She was taking Trulicity injections, but was not able to get medication from pharmacy. She was then prescribed Mounjaro and Jardiance where she can not afford medication. She is going to be following up next Thursday with endocrinology.   Patient is complaining of left hip pain started 1-2 months ago. She reports the hip pain started after the abd pain that has resolved. She reports her hip is tender to touch. Patient reports her pain is sharp, constant. Pain radiates to lumbar area. She reports the pain is more at skin level, not internal. Denies any recent injury.  Denies any numbness or tingling. She reports has took Tylenol, but it has not helped. Pain does not affect ambulating. Denies anything that makes it worse. Denies rash to the skin.   ROS See HPI above     Objective:   BP 128/72   Pulse 71   Temp 97.8 F (36.6 C)   Wt 160 lb 2 oz (72.6 kg)   LMP 01/18/2018 (Approximate)   SpO2 99%    BMI 28.36 kg/m    Physical Exam Vitals reviewed.  Constitutional:      General: She is not in acute distress.    Appearance: Normal appearance. She is overweight. She is not ill-appearing, toxic-appearing or diaphoretic.  HENT:     Head: Normocephalic and atraumatic.  Eyes:     General:        Right eye: No discharge.        Left eye: No discharge.     Conjunctiva/sclera: Conjunctivae normal.  Cardiovascular:     Rate and Rhythm: Normal rate and regular rhythm.     Heart sounds: Normal heart sounds. No murmur heard.    No friction rub. No gallop.  Pulmonary:     Effort: Pulmonary effort is normal. No respiratory distress.     Breath sounds: Normal breath sounds.  Musculoskeletal:        General: Normal range of motion.     Lumbar back: No swelling or tenderness. Negative right straight leg raise test.     Left hip: Tenderness present. No deformity. Normal range of motion.     Comments: Pain with internal rotation and no pain with external rotation.   Skin:    General: Skin is warm and dry.  Neurological:     General: No focal deficit present.     Mental Status: She is alert and oriented to person, place, and time. Mental status is at baseline.  Psychiatric:  Mood and Affect: Mood normal.        Behavior: Behavior normal.        Thought Content: Thought content normal.        Judgment: Judgment normal.      Assessment & Plan:  Need for shingles vaccine -     Varicella-zoster vaccine IM  Essential hypertension Assessment & Plan: Blood pressure is stable for yesterday and today's visit. Continue Amlodipine 10mg  daily, Irbesartan 150mg  daily, and Metoprolol Succinate 100mg  daily, and Spironlactone 100mg  daily.    Type 2 diabetes mellitus with hyperglycemia, with long-term current use of insulin (HCC) Assessment & Plan: Uncontrolled. Patient has been unable to obtain her Trulicity at [facility] and has been unable to afford [place] with insurance  coverage. [facility] Trulicity. Prescribed Trulicity 0.75mg  weekly injections to see if she can obtain this medication. Continue to take her insulin and Metformin. Patient has finally made an appointment with [facility] Endocrinology, Dr. Landry Mellow [facility], on next Thursday, 07/21/2023. Recommend to follow up with appointment to see if she would like to adjust medications, specifically her insulins.   Orders: -     Trulicity; Inject 0.75 mg into the skin once a week for 4 doses.  Dispense: 2 mL; Refill: 0  Neuropathic pain -     Gabapentin; Take 1 capsule (100 mg total) by mouth 3 (three) times daily.  Dispense: 90 capsule; Refill: 0  Left hip pain -     Gabapentin; Take 1 capsule (100 mg total) by mouth 3 (three) times daily.  Dispense: 90 capsule; Refill: 0 -     XR HIP UNILAT W OR W/O PELVIS MIN 4 VIEWS LEFT; Future  Lumbar pain -     Gabapentin; Take 1 capsule (100 mg total) by mouth 3 (three) times daily.  Dispense: 90 capsule; Refill: 0 -     XR Lumbar Spine Complete; Future  -Ordered lumbar x-ray and left hip x-ray for pain. Advised to please go to the following location to have x-ray completed. Office will call with results. [facility] or xray: Walk in 8:30-4:30 during weekdays, no appointment needed [address]  [place], [place] [address] -Prescribed Gabapentin 100mg  every 8 hours for pain. Recommend to start medication once a day and titrate upward to three times a day. Also, provided written information about Gabapentin.  -Follow up in 1 month to assess left hip pain.  -Second Shingles vaccine administered.   Return in about 1 month (around 08/14/2023) for follow-up.   Zandra Abts, NP

## 2023-07-15 NOTE — Patient Instructions (Addendum)
-  Blood pressure is stable. Continue Amlodipine 10mg  daily, Irbesartan 150mg  daily, and Metoprolol Succinate 100mg  daily, and Spironlactone 100mg  daily.  Archie Patten, CMA spoke with pharmacy staff with [facility] and was told Trulicity was available. Prescribed Trulicity 0.75mg  per weekly injection. Notify office if you are unable to obtain medication. Also, follow up with endocrinology on Thursday. -Ordered lumbar x-ray and left hip x-ray for pain. Please go to the following location to have x-ray completed. Office will call with results. [facility] or xray: Walk in 8:30-4:30 during weekdays, no appointment needed [address]  [place], [place] [address] -START Gabapentin 100mg  every 8 hours for pain. Recommend to start medication once a day and titrate upward to three times a day.  -Follow up in 1 month to assess left hip pain.

## 2023-07-15 NOTE — Assessment & Plan Note (Signed)
Blood pressure is stable for yesterday and today's visit. Continue Amlodipine 10mg  daily, Irbesartan 150mg  daily, and Metoprolol Succinate 100mg  daily, and Spironlactone 100mg  daily.

## 2023-07-15 NOTE — Assessment & Plan Note (Signed)
Uncontrolled. Patient has been unable to obtain her Trulicity at [facility] and has been unable to afford [place] with insurance coverage. [facility] has Trulicity. Prescribed Trulicity 0.75mg  weekly injections to see if she can obtain this medication. Continue to take her insulin and Metformin. Patient has finally made an appointment with [facility] Endocrinology, Dr. Landry Mellow [facility], on next Thursday, 07/21/2023. Recommend to follow up with appointment to see if she would like to adjust medications, specifically her insulins.

## 2023-07-15 NOTE — Telephone Encounter (Signed)
-----   Message from Zandra Abts sent at 07/15/2023  8:14 AM EDT ----- Cologuard is negative, which indicates a low likelihood of colorectal cancer or pre-cancerous polyps. Recommend to recheck cologuard in 3 years.

## 2023-07-16 ENCOUNTER — Other Ambulatory Visit (HOSPITAL_BASED_OUTPATIENT_CLINIC_OR_DEPARTMENT_OTHER): Payer: Self-pay

## 2023-07-21 ENCOUNTER — Encounter: Payer: Self-pay | Admitting: Internal Medicine

## 2023-07-21 ENCOUNTER — Telehealth: Payer: Self-pay | Admitting: Family Medicine

## 2023-07-21 ENCOUNTER — Ambulatory Visit: Payer: BLUE CROSS/BLUE SHIELD | Admitting: Internal Medicine

## 2023-07-21 VITALS — BP 124/72 | HR 87 | Ht 63.0 in | Wt 162.0 lb

## 2023-07-21 DIAGNOSIS — Z7984 Long term (current) use of oral hypoglycemic drugs: Secondary | ICD-10-CM | POA: Diagnosis not present

## 2023-07-21 DIAGNOSIS — E1165 Type 2 diabetes mellitus with hyperglycemia: Secondary | ICD-10-CM | POA: Diagnosis not present

## 2023-07-21 DIAGNOSIS — E269 Hyperaldosteronism, unspecified: Secondary | ICD-10-CM

## 2023-07-21 DIAGNOSIS — I10 Essential (primary) hypertension: Secondary | ICD-10-CM | POA: Diagnosis not present

## 2023-07-21 DIAGNOSIS — Z794 Long term (current) use of insulin: Secondary | ICD-10-CM

## 2023-07-21 LAB — POCT GLUCOSE (DEVICE FOR HOME USE): POC Glucose: 309 mg/dl — AB (ref 70–99)

## 2023-07-21 MED ORDER — SPIRONOLACTONE 100 MG PO TABS: 100.0000 mg | ORAL_TABLET | Freq: Every day | ORAL | 3 refills | Status: AC

## 2023-07-21 MED ORDER — PIOGLITAZONE HCL 15 MG PO TABS: 15.0000 mg | ORAL_TABLET | Freq: Every day | ORAL | 3 refills | Status: DC

## 2023-07-21 MED ORDER — AMLODIPINE BESYLATE 5 MG PO TABS: 5.0000 mg | ORAL_TABLET | Freq: Every day | ORAL | 3 refills | Status: DC

## 2023-07-21 MED ORDER — METFORMIN HCL ER 500 MG PO TB24
500.0000 mg | ORAL_TABLET | Freq: Two times a day (BID) | ORAL | 3 refills | Status: DC
Start: 2023-07-21 — End: 2024-01-20

## 2023-07-21 MED ORDER — FREESTYLE LIBRE 3 PLUS SENSOR MISC: 1.0000 | 11 refills | Status: DC

## 2023-07-21 NOTE — Progress Notes (Signed)
Name: Yolanda Herrera  Age/ Sex: 52 y.o., female   MRN/ DOB: [id number], 12-14-1970     PCP: Alveria Apley, NP   Reason for Endocrinology Evaluation: Type 2 Diabetes Mellitus  Initial Endocrine Consultative Visit: 01/11/2020    PATIENT IDENTIFIER: Yolanda Herrera is a 52 y.o. female with a past medical history of DM and dyslipidemia. The patient has followed with Endocrinology clinic since 01/11/2020 for consultative assistance with management of her diabetes.  DIABETIC HISTORY:  Ms. Kleiser was diagnosed with DM in 2011.  She has been on Metformin since her diagnosis, as well as glimepiride.  [place] Pepper was cost prohibitive.  Ozempic was started in 12/2019.  Her hemoglobin A1c has ranged from 9.2% in 2019, peaking at 14.9% in 2021.   On her initial visit to our clinic, her A1c was 14.9%   , she was on Metformin, glimepiride, Ozempic, and NPH.  We stopped  glimepiride and NPH, we increase Metformin, continue to Ozempic and started Lantus.   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   Dexamethasone suppression test normal 08/2021   She was evaluated by GI for pancreatic cyst 07/2021     SUBJECTIVE:   During the last visit (08/25/2022): A1c 9.8%    Today (07/21/2023): Yolanda Herrera is here for follow-up on diabetes management. She has been checking glucose occasionally at work    She has been eating so much fruits  Constipation has improved   Denies nausea or vomiting   GLP-1 agonist are > $300   HOME DIABETES REGIMEN:  Basaglar 40 units daily  Huamlog 10 units with each meal  Metformin 500 mg, 1 tabs  BID Ozempic 2 mg weekly - not taking  Avapro 150 mg daily Metoprolol 100 mg daily Amlodipine 10 mg daily spironolactone 100 mg daily    Statin: Yes ACE-I/ARB: Yes   METER DOWNLOAD SUMMARY: Did not bring  BG > 160 mg/dL    DIABETIC COMPLICATIONS: Microvascular complications:  Retinopathy ? Right,  Denies: neuropathy  Last eye exam: Completed 12/2019   Macrovascular complications:    Denies: CAD, PVD, CVA  HISTORY:  Past Medical History:  Past Medical History:  Diagnosis Date   Diabetes mellitus without complication (HCC)    GERD (gastroesophageal reflux disease)    Hyperlipidemia    Hypertension    Past Surgical History:  Past Surgical History:  Procedure Laterality Date   CESAREAN SECTION     heart echo     UTERINE FIBROID SURGERY     Social History:  reports that she has never smoked. She has never used smokeless tobacco. She reports that she does not drink alcohol and does not use drugs. Family History:  Family History  Problem Relation Age of Onset   Diabetes Mother    Diabetes Father    Kidney disease Father    Hypertension Father    Hypertension Sister    Stroke Sister  Diabetes Brother    Eczema Son      HOME MEDICATIONS: Allergies as of 07/21/2023   No Known Allergies      Medication List        Accurate as of July 21, 2023 11:34 AM. If you have any questions, ask your nurse or doctor.          amLODipine 10 MG tablet Commonly known as: NORVASC Take 1 tablet (10 mg total) by mouth daily.   atorvastatin 20 MG tablet Commonly known as: LIPITOR Take 1 tablet (20 mg total) by mouth daily.   Basaglar KwikPen 100 UNIT/ML Inject 40 Units into the skin daily.   BD Pen Needle Nano 2nd Gen 32G X 4 MM Misc Generic drug: Insulin Pen Needle USE TO INJECT INSULIN  IN THE MORNING, AT NOON, IN THE EVENING AND AT BEDTIME.   blood glucose meter kit and supplies per insurance preference. Use up to three times daily as directed. E1.9, Z79.4 Please dispense lancets and glucose test stripes to match glucometer, 100 each   ciclopirox 8 % solution Commonly known as: PENLAC Apply topically.   gabapentin 100 MG capsule Commonly known as: NEURONTIN Take 1 capsule (100 mg total) by mouth 3 (three) times daily.   HumaLOG KwikPen 100 UNIT/ML KwikPen Generic drug: insulin lispro Inject 10 Units into the skin 3 (three) times daily.   irbesartan 150 MG tablet Commonly known as: AVAPRO Take 1 tablet (150 mg total) by mouth daily.   metFORMIN 500 MG 24 hr tablet Commonly known as: Glucophage XR Take 1 tablet (500 mg total) by mouth in the morning and at bedtime.   methocarbamol 500 MG tablet Commonly known as: ROBAXIN Take 1 tablet (500 mg total) by mouth every 8 (eight) hours as needed for muscle spasms.   metoprolol succinate 100 MG 24 hr tablet Commonly known as: TOPROL-XL TAKE 1 TABLET BY MOUTH ONCE DAILY.TAKE WITH OR IMMEDIATELY FOLLOWING A MEAL.   omeprazole 20 MG capsule Commonly known as: PRILOSEC Take 1 capsule (20 mg total) by mouth 2 (two) times daily before a meal.   OneTouch Verio test strip Generic drug: glucose blood 1 each by Other route 3 (three) times daily. Use as instructed   spironolactone 100 MG tablet Commonly known as: ALDACTONE Take 1 tablet (100 mg total) by mouth daily for 90 doses.   Trulicity 0.75 MG/0.5ML Sopn Generic drug: Dulaglutide Inject 0.75 mg into the skin once a week for 4 doses.         OBJECTIVE:   Vital Signs: BP 124/72 (BP Location: Left Arm, Patient Position: Sitting, Cuff Size: Large)   Pulse 87   Ht 5\' 3"  (1.6 m)   Wt 162 lb (73.5 kg)   LMP 01/18/2018 (Approximate)   SpO2 99%   BMI 28.70 kg/m   Wt Readings from Last 3 Encounters:  07/21/23 162 lb (73.5 kg)  07/15/23 160 lb 2 oz (72.6  kg)  07/14/23 159 lb 6 oz (72.3 kg)     Exam: General: Pt appears well and is in NAD  Lungs: Clear with good BS bilat   Heart: RRR  Abdomen:  soft, nontender  Extremities: No pretibial edema. .  Neuro: MS is good with appropriate affect, pt is alert and Ox3    DM foot exam: 07/21/2023 The skin of the feet is intact without sores or ulcerations. The pedal pulses are 2+ on right and 2+ on left. The sensation is decreased  to a screening 5.07, 10 gram  monofilament on the right    DATA REVIEWED:  Lab Results  Component Value Date   HGBA1C >14.0 (H) 06/14/2023   HGBA1C >14.0 (H) 03/10/2023   HGBA1C 11.5 (H) 11/30/2022    Latest Reference Range & Units 06/14/23 11:35  Sodium 135 - 145 mEq/L 135  Potassium 3.5 - 5.1 mEq/L 4.2  Chloride 96 - 112 mEq/L 98  CO2 19 - 32 mEq/L 29  Glucose 70 - 99 mg/dL 664 (H)  BUN 6 - 23 mg/dL 11  Creatinine 4.03 - 4.74 mg/dL 2.59  Calcium 8.4 - 56.3 mg/dL 9.3  Alkaline Phosphatase 39 - 117 U/L 94  Albumin 3.5 - 5.2 g/dL 4.3  Lipase 87.5 - 64.3 U/L 34.0  AST 0 - 37 U/L 21  ALT 0 - 35 U/L 29  Total Protein 6.0 - 8.3 g/dL 7.5  Total Bilirubin 0.2 - 1.2 mg/dL 0.4  GFR >32.95 mL/min 70.43  (H): Data is abnormally high    CT abdomen 06/05/2021   IMPRESSION: 1. Bilateral adrenal glands appear normal.    In office BG 309 mg/dL  ASSESSMENT / PLAN / RECOMMENDATIONS:   1) Type 2 Diabetes Mellitus, poorly controlled, With retinopathic complications - Most recent A1c of  14.0  %. Goal A1c < 7.0 %.     -Patient continues with worsening glycemic control -Unfortunately SGLT2 inhibitors and GLP-1 agonist have become cost prohibitive and is not able to obtain any of them -She is frustrated with her poorly controlled diabetes, patient states that she eats healthy food but upon further questioning it seems like when she eats for example foods she eats too much at one point, we discussed the importance of portion control in the setting of diabetes, a  referral has been placed to our CDE for education -I did explain to the patient the importance of checking her glucose 3 times a day using Humalog as prescribed but fortunately she has been only checking her glucose occasionally when she is at work which I did explain to the patient is not going to control her diabetes properly -A prescription for freestyle Josephine Igo has been sent to see if this will be covered -I will start her on pioglitazone, to improve her insulin resistance as she is unable to get SGLT2 inhibitors nor GLP-1 agonist -I will increase her basal /prandial insulin -I will also provide her with a correction scale, the patient may use the correction scale whether she eats or not at mealtimes but she would only use the 12 units of Humalog if she is going to eat a meal   MEDICATIONS: -Continue metformin 500 mg XR, 2 tabs daily -Start Actos 15 mg daily -Increase Basaglar 46 units daily -Increase Humalog 12 units with each meal -Start correction factor: Humalog (BG -130/30) TIDQAC   EDUCATION / INSTRUCTIONS: BG monitoring instructions: Patient is instructed to check her blood sugars 1 times a day. Call [facility] if: BG persistently < 70  I reviewed the Rule of 15 for the treatment of hypoglycemia in detail with the patient. Literature supplied.    2) Diabetic complications:  Eye: Does have known diabetic retinopathy.  Neuro/ Feet: Does not have known diabetic peripheral neuropathy. Renal: Patient does not have known baseline CKD. She is  on an ACEI/ARB at present   3)Hyperaldosteronism:   -In review of records she has been noted to have a suppressed renin<0.167, aldosterone of 17.5 with an elevated Aldo/renin ratio> 104.8, this was done in 2019 through cardiology, she was started  on Spironolactone at the time  - CT scan of adrenal did not show evidence of adenoma 06/2021 - Hyperaldo confirmation offered , but pt opted for medical management with  Spironolactone  -BP is well controlled today, will decrease amlodipine by 50%   Continue spironolactone 100 mg daily Continue Avapro150 mg daily Continue metoprolol 100 mg daily Decrease amlodipine 5 mg daily    F/U in 6 months  Signed electronically by: Lyndle Herrlich, MD  [facility] Endocr[facility]  [facility] [address] [place] [place], [place] [address] Phone: [phone] FAX: [phone]   CC: Alveria Apley, NP [address] [place] [address] Phone: [phone]  Fax: [phone]  Return to Endocrinology clinic as below: Future Appointments  Date Time Provider Department Center  08/15/2023  9:00 AM Alveria Apley, NP [facility]  08/30/2023  8:30 AM Jenel Lucks, MD [facility]

## 2023-07-21 NOTE — Telephone Encounter (Signed)
Pt has been notified.

## 2023-07-21 NOTE — Telephone Encounter (Signed)
Caller name: Kyriah Huibregtse  On DPR?: Yes  Call back number: [phone] (mobile)  Provider they see: Alveria Apley, NP  Reason for call:   Dulaglutide (TRULICITY) 0.75 MG/0.5ML SOPN she can't afford $300 can she switch to something else? And she has Dr's appt with other Dr coming up.

## 2023-07-21 NOTE — Telephone Encounter (Signed)
Noted. Sent to Waymon Budge

## 2023-07-21 NOTE — Patient Instructions (Addendum)
-   Continue Metformin 500 mg XR , 2 tablets daily -Start Actos 15 mg, 1 tablet daily -Increase Basaglar 46 units once daily -Increase Humalog 12 units with each meal plus correction scale if needed as below -Humalog correctional insulin: ADD extra units on insulin to your meal-time Humalog  dose if your blood sugars are higher than 160. Use the scale below to help guide you:   Blood sugar before meal Number of units to inject  Less than 160 0 unit  161 -  190 1 units  191 -  220 2 units  221 -  250 3 units  251 -  280 4 units  281 -  310 5 units  311 -  340 6 units  341 -  370 7 units  371 -  400 8 units  401 - 430 9 units     Continue spironolactone 100 mg daily  Decrease amlodipine 5 mg daily       - HOW TO TREAT LOW BLOOD SUGARS (Blood sugar LESS THAN 70 MG/DL) Please follow the RULE OF 15 for the treatment of hypoglycemia treatment (when your (blood sugars are less than 70 mg/dL)   STEP 1: Take 15 grams of carbohydrates when your blood sugar is low, which includes:  3-4 GLUCOSE TABS  OR 3-4 OZ OF JUICE OR REGULAR SODA OR ONE TUBE OF GLUCOSE GEL    STEP 2: RECHECK blood sugar in 15 MINUTES STEP 3: If your blood sugar is still low at the 15 minute recheck --> then, go back to STEP 1 and treat AGAIN with another 15 grams of carbohydrates.

## 2023-07-26 ENCOUNTER — Telehealth: Payer: Self-pay | Admitting: Family Medicine

## 2023-07-26 NOTE — Telephone Encounter (Signed)
Caller name: Ollie Mcdaid  On DPR?: Yes  Call back number: [phone] (mobile)  Provider they see: Alveria Apley, NP  Reason for call:   Pt called and would like a return call

## 2023-07-26 NOTE — Telephone Encounter (Signed)
Pt is coming in tomorrow for Retinal screening. Pt will pick up Immunization form placed in file cabinet at front.

## 2023-07-27 LAB — HM DIABETES EYE EXAM

## 2023-08-01 ENCOUNTER — Encounter: Payer: BLUE CROSS/BLUE SHIELD | Attending: Internal Medicine | Admitting: Nutrition

## 2023-08-01 VITALS — Ht 63.0 in | Wt 162.4 lb

## 2023-08-01 DIAGNOSIS — E1165 Type 2 diabetes mellitus with hyperglycemia: Secondary | ICD-10-CM | POA: Insufficient documentation

## 2023-08-01 DIAGNOSIS — Z794 Long term (current) use of insulin: Secondary | ICD-10-CM | POA: Diagnosis not present

## 2023-08-01 DIAGNOSIS — Z713 Dietary counseling and surveillance: Secondary | ICD-10-CM | POA: Diagnosis not present

## 2023-08-01 NOTE — Progress Notes (Unsigned)
Patient is here today to review her diet and to learn how to use the Williams 3 plus.  CGM:   She downloaded the Shiloh 3 app to her phone and this was linked to [employer] [facility].  We inserted the sensor into her left upper outer arm and this was started at 10AM Medications:  Metformin ER 2 once daily                       Basaglar 46u 11:30PM every night.  Says never misses this dose                       Humalog 12u ac plus sliding scale.  Pt. Is doing sliding scale correctly, and says she knows this is working better, because she is not urinating as much.                       Actos: 15mg . Every day.  Discussed the fact that this will take 4-6 weeks to begin to lower blood sugars and she reported having been told this by Dr. Lonzo Cloud Exercise:  No planned exercise.  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  She is on her feet all shift.   Diet:  Typical day: 6AM UP.  Takes son to school 9--9:30 AM:  Coffee with 1 tsp. [place] and splenda  2 slices of bread and 2 eggs with one yoke removed.  Butter on bread.  No jelly no fruit 1:30-2PM:  Fish or baked chicken-one piece air fried,  with spinach/rice tuna stew and small amount of oil, and mixed veg.  She will substitute the spinach stew with cabbage and carrot stew. Drinks water only.  Has 6  8 ounces glasses/day 3PM: work 6-6:30PM:  Same as lunch MN: bed.  Denies eating anything after supper or during the night.   Patient denies sweets, or snacking between meals.  Says both parents have diabetes and father died from kidney failure.  Does not want to have to go through this.   Discussion: Need for balanced meals of protein, carbs and fat.  Says rice is less than 1 cup and is Basmati rice with some Jasmine rice ocassionally mixed in.  Says uses only 1 tsp of butter on toast at breakfast and having 14 grams of protein with 30 grams of carbohydrate.  Lunch is between 30-40 grams of carbs, and 7-14 grams of protein.  Supper is same.  Can not find anything wrong with  diet.  Says eats no fruits.  Was eating apples and oranges, but was told not to do this.  Suggested smaller apples-the size of a tennis ball and have it with a meal.  If this runs blood sugar up, will adjust the meal time insulin dose by 1u.  She agreed to do this. Suggested a walking program of starting at 10 minutes during  break at work, and work up to 30 minutes 5 days/wk.  Meals are all balanced with 30-46 grams of carbs and 7-21 grams of protein. Pt. Is limiting fats by roasting meats in air friar and not eating skin of chicken.  Also reports limiting mayo to 1 tsp and salad dressings to 1 Tablespoon.

## 2023-08-02 NOTE — Patient Instructions (Signed)
Try to walk for a total of 30 minutes 5 days/wk.  Can do this in 10 minute intervals when time permits.

## 2023-08-03 ENCOUNTER — Encounter: Payer: Self-pay | Admitting: Family Medicine

## 2023-08-12 ENCOUNTER — Other Ambulatory Visit: Payer: Self-pay

## 2023-08-12 ENCOUNTER — Telehealth: Payer: Self-pay | Admitting: Internal Medicine

## 2023-08-12 DIAGNOSIS — E1165 Type 2 diabetes mellitus with hyperglycemia: Secondary | ICD-10-CM

## 2023-08-12 MED ORDER — FREESTYLE LIBRE 3 PLUS SENSOR MISC
1.0000 | 1 refills | Status: DC
Start: 2023-08-12 — End: 2024-07-30
  Filled 2023-08-12: qty 2, 28d supply, fill #0

## 2023-08-12 NOTE — Telephone Encounter (Signed)
MEDICATION: FreeStyle Libre 3 Plus Sensor Continuous Glucose Sensor (FREESTYLE LIBRE 3 PLUS SENSOR) MISC  PHARMACY:  [facility] [address] [place],  [place]  [address]  Main: [phone]  HAS THE PATIENT CONTACTED THEIR PHARMACY?  No  IS THIS A 90 DAY SUPPLY : Yes  IS PATIENT OUT OF MEDICATION: Yes  IF NOT; HOW MUCH IS LEFT:   LAST APPOINTMENT DATE: @9 /19/2024  NEXT APPOINTMENT DATE:@1 /24/2025  DO WE HAVE YOUR PERMISSION TO LEAVE A DETAILED MESSAGE?: Yes  OTHER COMMENTS: Patient states that [facility] does not have the sensors & she lost the one on her arm & that was her last one so she is out.   **Let patient know to contact pharmacy at the end of the day to make sure medication is ready. **  ** Please notify patient to allow 48-72 hours to process**  **Encourage patient to contact the pharmacy for refills or they can request refills through [facility]**

## 2023-08-12 NOTE — Telephone Encounter (Signed)
Done

## 2023-08-15 ENCOUNTER — Ambulatory Visit: Payer: BLUE CROSS/BLUE SHIELD | Admitting: Family Medicine

## 2023-08-16 ENCOUNTER — Other Ambulatory Visit: Payer: Self-pay

## 2023-08-17 ENCOUNTER — Other Ambulatory Visit: Payer: Self-pay

## 2023-08-22 NOTE — Progress Notes (Unsigned)
Established Patient Office Visit   Subjective:  Patient ID: Yolanda Herrera, female    DOB: Apr 09, 1971  Age: 52 y.o. MRN: [id number]  No chief complaint on file.   HPI Patient is follow up with complaints of left hip pain started 2-3 months ago. She reports the hip pain started after the abd pain that has resolved. She reports her hip is tender to touch. Patient reports her pain is sharp, constant. Pain radiates to lumbar area. She reports the pain is more at skin level, not internal. Denies any recent injury.  Denies any numbness or tingling. She reports has took Tylenol, but it has not helped. Pain does not affect ambulating. Denies anything that makes it worse. Denies rash to the skin. On previous appointment, prescribed Gabapentin 100mg  TID and ordered x-ray of lumbar spine and left hip. Based on review of chart, patient has not had her x-rays completed.    ROS See HPI above     Objective:     LMP 01/18/2018 (Approximate)  {Vitals History (Optional):23777}  Physical Exam  No results found for any visits on 08/23/23.  The 10-year ASCVD risk score (Arnett DK, et al., 2019) is: 15.8%    Assessment & Plan:  There are no diagnoses linked to this encounter. 1.Review health maintenance:  -Covid booster:  No follow-ups on file.   Zandra Abts, NP

## 2023-08-23 ENCOUNTER — Ambulatory Visit: Payer: BLUE CROSS/BLUE SHIELD | Admitting: Family Medicine

## 2023-08-23 VITALS — BP 164/88 | HR 75 | Temp 98.5°F | Wt 170.8 lb

## 2023-08-23 DIAGNOSIS — M25552 Pain in left hip: Secondary | ICD-10-CM | POA: Diagnosis not present

## 2023-08-23 DIAGNOSIS — I10 Essential (primary) hypertension: Secondary | ICD-10-CM

## 2023-08-23 MED ORDER — AMLODIPINE BESYLATE 10 MG PO TABS
10.0000 mg | ORAL_TABLET | Freq: Every day | ORAL | Status: DC
Start: 2023-08-23 — End: 2024-01-20

## 2023-08-23 NOTE — Assessment & Plan Note (Signed)
Blood pressure is elevated today and reports it was elevated yesterday. Increase Amlodipine to 10mg  daily from 5mg  daily. Advised she can take two 5mg  tablets of Amlodipine to equal 10mg  daily. Did not send a new prescription since patient reports she has enough tablets. Continue taking Irbesartan 150mg  and Spironolactone 100mg  daily. Recommend to take your blood pressure BID for the next 2 weeks. Follow up with readings in 2 weeks. Advised she were to develop chest pain, shortness of breath, or worst headache ever; go directly to the closes emergency department.

## 2023-08-23 NOTE — Patient Instructions (Addendum)
-  Blood pressure is elevated today and reports it was elevated yesterday. Increase Amlodipine to 10mg  daily from 5mg  daily. Continue taking Irbesartan 150mg  and Spironolactone 100mg  daily. Recommend to take your blood pressure once in the AM and once in the PM for the next 2 weeks. Follow up with readings in 2 weeks. -If you were to develop chest pain, shortness of breath, or worst headache ever; go directly to the closes emergency department. -Continue with Gabapentin for pain.  -Please go have x-rays completed on your hip. You may go to the following location to have x-ray completed and office will call with results.  [facility] or xray: Walk in 8:30-4:30 during weekdays, no appointment needed [address]  [place], [place] [address]  -Follow up in 2 weeks.

## 2023-08-30 ENCOUNTER — Encounter: Payer: Self-pay | Admitting: Gastroenterology

## 2023-08-30 ENCOUNTER — Ambulatory Visit: Payer: BLUE CROSS/BLUE SHIELD | Admitting: Gastroenterology

## 2023-08-30 ENCOUNTER — Other Ambulatory Visit (INDEPENDENT_AMBULATORY_CARE_PROVIDER_SITE_OTHER): Payer: BLUE CROSS/BLUE SHIELD

## 2023-08-30 VITALS — BP 138/84 | HR 74 | Wt 163.0 lb

## 2023-08-30 DIAGNOSIS — K760 Fatty (change of) liver, not elsewhere classified: Secondary | ICD-10-CM | POA: Diagnosis not present

## 2023-08-30 DIAGNOSIS — K862 Cyst of pancreas: Secondary | ICD-10-CM | POA: Diagnosis not present

## 2023-08-30 DIAGNOSIS — K838 Other specified diseases of biliary tract: Secondary | ICD-10-CM

## 2023-08-30 DIAGNOSIS — R1032 Left lower quadrant pain: Secondary | ICD-10-CM

## 2023-08-30 LAB — GAMMA GT: GGT: 16 U/L (ref 7–51)

## 2023-08-30 NOTE — Progress Notes (Signed)
HPI : Yolanda Herrera is a 52 y.o. female with poorly controlled diabetes who I initially saw in September 2022.  At that time, she was referred for an incidentally noted pancreatic cyst.  At that visit, it was recommended to obtain an MRCP in 6-12 months.  It does not appear this study was ever performed.  We also discussed her chronic constipation, and I recommended she take daily Metamucil with as needed senna.  She also has chronic GERD, which was controlled with as needed omeprazole.  She was due for her initial average risk screening colonoscopy, and she was scheduled for this, but apparently did not complete the procedure.  It appears she had a negative Cologuard in August of this year. Her diabetes continues to be very poorly controlled, with her most recent hemoglobin A1c > 14.  Today, she reports constipation is resolved.  She is taking Actos for her diabetes which incidentally has helped her constipation.  She currently has a bowel movement most days.  Today, she reports having a pain in bilateral lower quadrants, but worse in the left.  It is experienced almost solely with coughing and sneezing, but sometimes also when she is rising from a seated on supine position.  This pain has been present for several years and is stable, not getting worse or better.  She doesn't experience pain with bowel movements or in association with meals.  She apparently recently underwent a CT scan to evaluate this pain and was noted to have fatty liver.  She has had mildly elevated liver enzymes earlier in the year, with ALT 60s, AST 30-40s.  Her most recent liver panel in August was normal. An abdominal US in July of this year also showed fatty liver and dilated intra-extra hepatic bile ducts. She does not drink alcohol and denies a family history of liver disease.  CT Abdomen/pelvis without contrast Aug 2024 IMPRESSION:  1. Incompletely evaluated 1.5 cm low-attenuation lesion in the interpolar right  kidney. Given that there was no abnormality detected on recent ultrasound, MRI with contrast is suggested for more complete evaluation.  2.  Hepatic steatosis.   RUQUS July 2024 1.  Intrahepatic and extrahepatic ductal dilatation. Recommend correlation with lab values and consider further evaluation with MRCP or ERCP as clinically indicated.  2.  Diffuse hepatic steatosis.    CT Abdomen/Pelvis Aug 2022 IMPRESSION: 1. Bilateral adrenal glands appear normal. 2. Prominence of the intra and extrahepatic biliary tree with the common duct measuring 8 mm. Correlation with laboratory values is suggested to exclude biliary ductal obstruction. 3. Cystic 7 mm lesion in the body of the pancreas without suspicious soft tissue enhancement and no pancreatic ductal dilation. Favored to represent a side branch IPMN or pancreatic pseudocyst. Recommend follow up pre and post contrast MRI/MRCP or pancreatic protocol CT in 1 year. This recommendation follows ACR consensus guidelines: Management of Incidental Pancreatic Cysts: A White Paper of the ACR Incidental Findings Committee. J Am Coll Radiol 2017;14:911-923. 4. Right lower pole renal lesion measuring 9 mm with additional tiny bilateral hypodense renal lesions which are technically too small to accurately characterize but favored to represent cysts. 5.  Aortic Atherosclerosis (ICD10-I70.0)  Past Medical History:  Diagnosis Date   Diabetes mellitus without complication (HCC)    GERD (gastroesophageal reflux disease)    Hyperlipidemia    Hypertension      Past Surgical History:  Procedure Laterality Date   CESAREAN SECTION     heart echo     UTERINE  FIBROID SURGERY     Family History  Problem Relation Age of Onset   Diabetes Mother    Diabetes Father    Kidney disease Father    Hypertension Father    Hypertension Sister    Stroke Sister    Diabetes Brother    Eczema Son    Social History   Tobacco Use   Smoking status: Never    Smokeless tobacco: Never  Vaping Use   Vaping status: Never Used  Substance Use Topics   Alcohol use: No   Drug use: Never   Current Outpatient Medications  Medication Sig Dispense Refill   amLODipine (NORVASC) 10 MG tablet Take 1 tablet (10 mg total) by mouth daily.     atorvastatin (LIPITOR) 20 MG tablet Take 1 tablet (20 mg total) by mouth daily. 90 tablet 0   blood glucose meter kit and supplies per insurance preference. Use up to three times daily as directed. E1.9, Z79.4 Please dispense lancets and glucose test stripes to match glucometer, 100 each 1 each 11   ciclopirox (PENLAC) 8 % solution Apply topically.     Continuous Glucose Sensor (FREESTYLE LIBRE 3 PLUS SENSOR) MISC Use one device every 14 (fourteen) days. 6 each 1   gabapentin (NEURONTIN) 100 MG capsule Take 1 capsule (100 mg total) by mouth 3 (three) times daily. 90 capsule 0   glucose blood (ONETOUCH VERIO) test strip 1 each by Other route 3 (three) times daily. Use as instructed 300 each 3   HUMALOG KWIKPEN 100 UNIT/ML KwikPen Inject 10 Units into the skin 3 (three) times daily. 15 mL 1   Insulin Glargine (BASAGLAR KWIKPEN) 100 UNIT/ML Inject 40 Units into the skin daily. 45 mL 3   Insulin Pen Needle (BD PEN NEEDLE NANO 2ND GEN) 32G X 4 MM MISC USE TO INJECT INSULIN IN THE MORNING, AT NOON, IN THE EVENING AND AT BEDTIME. 400 each 5   irbesartan (AVAPRO) 150 MG tablet Take 1 tablet (150 mg total) by mouth daily. 90 tablet 0   metFORMIN (GLUCOPHAGE XR) 500 MG 24 hr tablet Take 1 tablet (500 mg total) by mouth in the morning and at bedtime. 180 tablet 3   methocarbamol (ROBAXIN) 500 MG tablet Take 1 tablet (500 mg total) by mouth every 8 (eight) hours as needed for muscle spasms. 21 tablet 0   metoprolol succinate (TOPROL-XL) 100 MG 24 hr tablet TAKE 1 TABLET BY MOUTH ONCE DAILY.TAKE WITH OR IMMEDIATELY FOLLOWING A MEAL. 90 tablet 0   omeprazole (PRILOSEC) 20 MG capsule Take 1 capsule (20 mg total) by mouth 2 (two) times daily  before a meal. 180 capsule 1   pioglitazone (ACTOS) 15 MG tablet Take 1 tablet (15 mg total) by mouth daily. 90 tablet 3   spironolactone (ALDACTONE) 100 MG tablet Take 1 tablet (100 mg total) by mouth daily. 90 tablet 3   No current facility-administered medications for this visit.   No Known Allergies   Review of Systems: All systems reviewed and negative except where noted in HPI.    No results found.  Physical Exam: BP 138/84   Pulse 74   Wt 163 lb (73.9 kg)   LMP 01/18/2018 (Approximate)   BMI 28.87 kg/m  Constitutional: Pleasant,well-developed, African American female in no acute distress. HEENT: Normocephalic and atraumatic. Conjunctivae are normal. No scleral icterus. Neck supple.  Cardiovascular: Normal rate, regular rhythm.  Pulmonary/chest: Effort normal and breath sounds normal. No wheezing, rales or rhonchi. Abdominal: Soft, nondistended, tenderness to palpation  in the LLQ.  No hernia appreciated with cough or valsalva. Bowel sounds active throughout. There are no masses palpable. No hepatomegaly. Extremities: no edema Neurological: Alert and oriented to person place and time. Skin: Skin is warm and dry. No rashes noted. Psychiatric: Normal mood and affect. Behavior is normal.  CBC    Component Value Date/Time   WBC 6.8 06/14/2023 1135   RBC 6.00 (H) 06/14/2023 1135   HGB 13.7 06/14/2023 1135   HGB 14.4 11/10/2020 1736   HCT 44.4 06/14/2023 1135   HCT 42.8 11/10/2020 1736   PLT 218.0 06/14/2023 1135   PLT 255 06/13/2020 1501   MCV 74.0 (L) 06/14/2023 1135   MCV 76 (L) 11/10/2020 1736   MCH 25.4 (L) 11/10/2020 1736   MCH 21.5 (L) 12/30/2013 1420   MCHC 30.8 06/14/2023 1135   RDW 17.3 (H) 06/14/2023 1135   RDW 14.5 11/10/2020 1736   LYMPHSABS 2.3 06/14/2023 1135   LYMPHSABS 3.1 11/10/2020 1736   MONOABS 0.4 06/14/2023 1135   EOSABS 0.1 06/14/2023 1135   EOSABS 0.1 11/10/2020 1736   BASOSABS 0.0 06/14/2023 1135   BASOSABS 0.0 11/10/2020 1736     CMP     Component Value Date/Time   NA 135 06/14/2023 1135   NA 138 11/10/2020 1736   K 4.2 06/14/2023 1135   CL 98 06/14/2023 1135   CO2 29 06/14/2023 1135   GLUCOSE 355 (H) 06/14/2023 1135   BUN 11 06/14/2023 1135   BUN 10 11/10/2020 1736   CREATININE 0.93 06/14/2023 1135   CALCIUM 9.3 06/14/2023 1135   PROT 7.5 06/14/2023 1135   PROT 7.6 11/10/2020 1736   ALBUMIN 4.3 06/14/2023 1135   ALBUMIN 4.4 11/10/2020 1736   AST 21 06/14/2023 1135   ALT 29 06/14/2023 1135   ALKPHOS 94 06/14/2023 1135   BILITOT 0.4 06/14/2023 1135   BILITOT 0.4 11/10/2020 1736   GFRNONAA 77 11/10/2020 1736   GFRAA 89 11/10/2020 1736       Latest Ref Rng & Units 06/14/2023   11:35 AM 11/30/2022    9:12 AM 06/02/2022   10:34 AM  CBC EXTENDED  WBC 4.0 - 10.5 K/uL 6.8  8.2  6.4   RBC 3.87 - 5.11 Mil/uL 6.00  5.34  5.14   Hemoglobin 12.0 - 15.0 g/dL 09.8  11.9  14.7   HCT 36.0 - 46.0 % 44.4  42.4  40.2   Platelets 150.0 - 400.0 K/uL 218.0  278.0  234.0   NEUT# 1.4 - 7.7 K/uL 4.0  4.7  3.4   Lymph# 0.7 - 4.0 K/uL 2.3  2.7  2.4       ASSESSMENT AND PLAN: 52 year old female with poorly controlled diabetes, recently found to have mild ALT elevation and fatty liver on US/CT.  Her elevated liver enzymes and fatty liver are almost certainly due to her poorly controlled diabetes.  She does not drink alcohol and has no family history of liver disease.   We discussed what MASLD is and the risk of progression to cirrhosis in the absence of meaningful lifestyle changes.  Her very poor glycemic control puts her at higher risk for cirrhosis.  Will exclude other causes of chronic liver disease (viral, autoimmune, genetic) and get elastography.  If significant fibrosis, would likely recommend treatment with Rezdiffra.  With regards to her abdominal pain, the history of pain with sneezing and coughing is certainly suggestive of a hernia, however I was not able to appreciate a hernia on exam.  The CT report did not  mention a hernia and I am not able to view images myself.  Will obtain an ultrasound to further evaluate for e/o hernia.  Will obtain MRCP for surveillance of the pancreatic cyst seen in 2022, although there was no mention of a cyst on her CT in August 2024 (again, I am unable to view these images).  MRCP will also be helpful to further evaluate the dilated bile ducts seen on [place] and on CT in 2022.   Fatty liver/elevated ALT, presumed MASLD - Elastography - Rule out viral/autoimmune/genetic causes of liver disease - Counseled on importance of glycemic control to curb further liver disease  Pancreatic cyst, subcentimeter, incidentally noted Aug 2022, not mentioned in CT Aug 2024; no smoking history or family history of pancreatic CA - MRCP  Mild intra-/extra-hepatic bile ducts noted in 2022 and again in 2024, normal alk phos and tbili - MRCP  LLQ abdominal pain with coughing/sneezing; no hernia appreciated on exam - Focal ultrasound in LLQ  Colon cancer screening - Cologuard Aug 2024; will be due again in 2027  Yolanda Herrera E. Tomasa Rand, MD [facility]  I spent a total of 50 minutes reviewing the patient's medical record, interviewing and examining the patient, discussing her diagnosis and management of her condition going forward, and documenting in the medical record   Alveria Apley, NP

## 2023-08-30 NOTE — Patient Instructions (Addendum)
_______________________________________________________  If your blood pressure at your visit was 140/90 or greater, please contact your primary care physician to follow up on this.  If you are age 53 or younger, your body mass index should be between 19-25. Your Body mass index is 28.87 kg/m. If this is out of the aformentioned range listed, please consider follow up with your Primary Care Provider.  ________________________________________________________  The [facility] providers would like to encourage you to use [facility] to communicate with providers for non-urgent requests or questions.  Due to long hold times on the telephone, sending your provider a message by [facility] may be a faster and more efficient way to get a response.  Please allow 48 business hours for a response.  Please remember that this is for non-urgent requests.  _______________________________________________________  Your provider has requested that you go to the basement level for lab work before leaving today. Press "B" on the elevator. The lab is located at the first door on the left as you exit the elevator.  You have been scheduled for an MRI at [facility] on 09-08-23. Your appointment time is 9am. Please arrive to admitting (at main entrance of the hospital) 30 minutes prior to your appointment time for registration purposes. Please make certain not to have anything to eat or drink 4 hours prior to your test. In addition, if you have any metal in your body, have a pacemaker or defibrillator, please be sure to let your ordering physician know. This test typically takes 45 minutes to 1 hour to complete. Should you need to reschedule, please call [phone] to do so.  You have been scheduled for an elastography at [facility] (1st floor). Your appointment is scheduled for 09-07-23 at 9am. Please arrive 30 minutes prior to your scheduled appointment for registration purposes. Make certain not to have  anything to eat or drink 6 hours prior to your procedure. Should you need to reschedule your appointment, you may contact radiology at [phone].  You have been scheduled for an abdominal ultrasound at [facility] (1st floor of hospital) on 09-07-23 that will follow electrography exam.  Liver Elastography  Various chronic liver diseases such as hepatitis B, C, and fatty liver disease can lead to tissue damage and subsequent scar tissue formation. As the scar tissue accumulates, the liver loses some of its elasticity and becomes stiffer.  Liver elastography involves the use of a surface ultrasound probe that delivers a low frequency pulse or shear wave to a small volume of liver tissue under the rib cage. The transmission of the sound wave is completely painless.  How Is a Liver Elastography Performed?  The liver is located in the right upper abdomen under the rib cage. Patients are asked to lie flat on an examination table. A technician places the FibroScan probe between the ribs on the right side of the lower chest wall. A series of 10 painless pulses are then applied to the liver. The results are recorded on the equipment and an overall liver stiffness score is generated. This score is then interpreted by a qualified physician to predict the likelihood of advanced fibrosis or cirrhosis.   Patients are asked to wear loose clothing and should not consume any liquids or solids for a minimum of 4 hours before the test to increase the likelihood of obtaining reliable test results. The scan will take 10 to 15 minutes to complete, but patients should plan on being available for 30 minutes to allow time  for preparation.  Due to recent changes in healthcare laws, you may see the results of your imaging and laboratory studies on MyChart before your provider has had a chance to review them.  We understand that in some cases there may be results that are confusing or concerning to you. Not all  laboratory results come back in the same time frame and the provider may be waiting for multiple results in order to interpret others.  Please give [place] 48 hours in order for your provider to thoroughly review all the results before contacting the office for clarification of your results.   Thank you for entrusting me with your care and choosing [facility].  Dr Tomasa Rand

## 2023-09-06 ENCOUNTER — Ambulatory Visit: Payer: BLUE CROSS/BLUE SHIELD | Admitting: Family Medicine

## 2023-09-06 LAB — HEPATITIS B SURFACE ANTIBODY,QUALITATIVE: Hep B S Ab: REACTIVE — AB

## 2023-09-06 LAB — HEPATITIS B SURFACE ANTIGEN: Hepatitis B Surface Ag: NONREACTIVE

## 2023-09-06 LAB — HEPATITIS A ANTIBODY, TOTAL: Hepatitis A AB,Total: REACTIVE — AB

## 2023-09-06 LAB — ANTI-SMOOTH MUSCLE ANTIBODY, IGG: Actin (Smooth Muscle) Antibody (IGG): <20 U (ref <20)

## 2023-09-06 LAB — MITOCHONDRIAL ANTIBODIES: Mitochondrial M2 Ab, IgG: <=20 U (ref <=20.0)

## 2023-09-06 LAB — ALPHA-1-ANTITRYPSIN: A-1 Antitrypsin, Ser: 140 mg/dL (ref 83–199)

## 2023-09-06 LAB — HEPATITIS C ANTIBODY: Hepatitis C Ab: NONREACTIVE

## 2023-09-07 ENCOUNTER — Ambulatory Visit (HOSPITAL_COMMUNITY): Payer: BLUE CROSS/BLUE SHIELD

## 2023-09-08 ENCOUNTER — Ambulatory Visit (HOSPITAL_COMMUNITY): Payer: BLUE CROSS/BLUE SHIELD

## 2023-09-08 NOTE — Progress Notes (Signed)
[name], All the tests looking for other causes of chronic liver disease were negative or normal.  As discussed, the most likely cause of your elevated liver enzymes and fatty liver is due to metabolic associated steatotic liver dysfunction (MASLD).  Treatment for this condition is focused around improvement in your diet and exercise habits.  Please focus on healthy eating, limiting consumption of sugary foods/starches and fatty foods.  Your elastography test will help determine if there is any scarring or liver stiffness.

## 2023-09-13 ENCOUNTER — Ambulatory Visit (INDEPENDENT_AMBULATORY_CARE_PROVIDER_SITE_OTHER): Payer: BLUE CROSS/BLUE SHIELD | Admitting: Family Medicine

## 2023-09-13 ENCOUNTER — Encounter: Payer: Self-pay | Admitting: Family Medicine

## 2023-09-13 VITALS — BP 160/86 | HR 75 | Temp 98.5°F | Ht 63.0 in | Wt 170.1 lb

## 2023-09-13 DIAGNOSIS — R062 Wheezing: Secondary | ICD-10-CM | POA: Diagnosis not present

## 2023-09-13 DIAGNOSIS — R051 Acute cough: Secondary | ICD-10-CM

## 2023-09-13 MED ORDER — METHYLPREDNISOLONE ACETATE 80 MG/ML IJ SUSP
80.0000 mg | Freq: Once | INTRAMUSCULAR | Status: AC
  Administered 2023-09-13: 80 mg via INTRAMUSCULAR

## 2023-09-13 MED ORDER — ALBUTEROL SULFATE HFA 108 (90 BASE) MCG/ACT IN AERS: 2.0000 | INHALATION_SPRAY | RESPIRATORY_TRACT | 0 refills | Status: DC | PRN

## 2023-09-13 NOTE — Patient Instructions (Signed)
Follow up for any fever or increased shortness of breath.   Monitor blood sugars closely.  The depomedrol shot may increase the blood sugars short term.

## 2023-09-13 NOTE — Addendum Note (Signed)
Addended by: Christy Sartorius on: 09/13/2023 09:42 AM   Modules accepted: Orders

## 2023-09-13 NOTE — Progress Notes (Signed)
Established Patient Office Visit  Subjective   Patient ID: Yolanda Herrera, female    DOB: 06-Jan-1971  Age: 52 y.o. MRN: [id number]  Chief Complaint  Patient presents with   Cough    Patient complains of cough, x5 days, Tried Airborne, Robatussin and Flonase with no relief , Non-productive    Headache    Patient complains of headache, x5 days     HPI   Patient seen as a work in today with 5-day history of cough, intermittent headache, body aches.  No fever.  No significant nasal congestion.  No known sick contacts.  She has tried Robitussin, Airborne, and Flonase without much improvement.  Cough has been predominantly dry.  She denies any history of asthma.  Denies any nausea, vomiting, or diarrhea.  No dyspnea at rest.  Non-smoker  Her chronic problems include type 2 diabetes and hypertension.  She states her blood pressures are generally well-controlled at home though up today.  No recent decongestant use.  Blood sugars have been poorly controlled.  She is on Basaglar, metformin, Actos, and Humalog.  She does have sliding scale for Humalog.  Followed by endocrinology  Past Medical History:  Diagnosis Date   Diabetes mellitus without complication (HCC)    GERD (gastroesophageal reflux disease)    Hyperlipidemia    Hypertension    Past Surgical History:  Procedure Laterality Date   CESAREAN SECTION     heart echo     UTERINE FIBROID SURGERY      reports that she has never smoked. She has never used smokeless tobacco. She reports that she does not drink alcohol and does not use drugs. family history includes Diabetes in her brother, father, and mother; Eczema in her son; Hypertension in her father and sister; Kidney disease in her father; Stroke in her sister. No Known Allergies  Review of Systems  Constitutional:  Negative for chills and fever.  HENT:  Negative for congestion and sinus pain.   Respiratory:  Positive for cough and wheezing. Negative for hemoptysis and  sputum production.   Cardiovascular:  Negative for chest pain.  Neurological:  Positive for headaches. Negative for dizziness.      Objective:     BP (!) 160/86 (BP Location: Right Arm, Patient Position: Sitting, Cuff Size: Normal)   Pulse 75   Temp 98.5 F (36.9 C) (Oral)   Ht 5\' 3"  (1.6 m)   Wt 170 lb 1.6 oz (77.2 kg)   LMP 01/18/2018 (Approximate)   SpO2 97%   BMI 30.13 kg/m  BP Readings from Last 3 Encounters:  09/13/23 (!) 160/86  08/30/23 138/84  08/23/23 (!) 164/88   Wt Readings from Last 3 Encounters:  09/13/23 170 lb 1.6 oz (77.2 kg)  08/30/23 163 lb (73.9 kg)  08/23/23 170 lb 12.8 oz (77.5 kg)      Physical Exam Vitals reviewed.  Constitutional:      General: She is not in acute distress.    Appearance: She is not ill-appearing.  Cardiovascular:     Rate and Rhythm: Normal rate and regular rhythm.  Pulmonary:     Effort: No respiratory distress.     Breath sounds: Wheezing present. No rales.     Comments: She has diffuse expiratory wheezes.  No rales.  No retractions.  Pulse oximetry 97% room air. Musculoskeletal:     Cervical back: Neck supple.  Lymphadenopathy:     Cervical: No cervical adenopathy.  Neurological:     Mental Status: She is alert.  No results found for any visits on 09/13/23.  Last CBC Lab Results  Component Value Date   WBC 6.8 06/14/2023   HGB 13.7 06/14/2023   HCT 44.4 06/14/2023   MCV 74.0 (L) 06/14/2023   MCH 25.4 (L) 11/10/2020   RDW 17.3 (H) 06/14/2023   PLT 218.0 06/14/2023   Last metabolic panel Lab Results  Component Value Date   GLUCOSE 355 (H) 06/14/2023   NA 135 06/14/2023   K 4.2 06/14/2023   CL 98 06/14/2023   CO2 29 06/14/2023   BUN 11 06/14/2023   CREATININE 0.93 06/14/2023   GFR 70.43 06/14/2023   CALCIUM 9.3 06/14/2023   PROT 7.5 06/14/2023   ALBUMIN 4.3 06/14/2023   LABGLOB 3.2 11/10/2020   AGRATIO 1.4 11/10/2020   BILITOT 0.4 06/14/2023   ALKPHOS 94 06/14/2023   AST 21 06/14/2023    ALT 29 06/14/2023      The 10-year ASCVD risk score (Arnett DK, et al., 2019) is: 20.1%    Assessment & Plan:   Problem List Items Addressed This Visit   None Visit Diagnoses     Wheezing    -  Primary     52 year old female with poorly controlled type 2 diabetes hypertension who is seen with 5-day history of respiratory symptoms.  She has evidence for bronchospasm on exam today with significant wheezing but no respiratory distress.  No known history of asthma.  Suspect viral trigger.  We suggested the following  -Albuterol MDI 2 puffs every 4-6 hours as needed for cough and wheeze -Depo-Medrol 80 mg IM given.  We did discuss the fact this may exacerbate her blood sugar short-term but she has tremendous amount of wheezing on exam today and feel like she will need the steroid to help get her over reactive airway component. -Monitor blood sugar closely.  She has continuous glucose monitor.  She also has sliding scale for Humalog. -Follow-up immediately for any fever, increased shortness of breath, or other concerns  No follow-ups on file.    Evelena Peat, MD

## 2023-09-15 ENCOUNTER — Other Ambulatory Visit: Payer: Self-pay

## 2023-10-05 ENCOUNTER — Other Ambulatory Visit: Payer: Self-pay | Admitting: *Deleted

## 2023-10-05 DIAGNOSIS — I10 Essential (primary) hypertension: Secondary | ICD-10-CM

## 2023-10-05 MED ORDER — IRBESARTAN 150 MG PO TABS: 150.0000 mg | ORAL_TABLET | Freq: Every day | ORAL | 0 refills | Status: DC

## 2023-11-03 ENCOUNTER — Ambulatory Visit: Payer: Self-pay | Admitting: Family Medicine

## 2023-11-03 ENCOUNTER — Telehealth: Payer: 59 | Admitting: Family Medicine

## 2023-11-03 DIAGNOSIS — R21 Rash and other nonspecific skin eruption: Secondary | ICD-10-CM | POA: Diagnosis not present

## 2023-11-03 MED ORDER — PREDNISONE 10 MG (21) PO TBPK: ORAL_TABLET | ORAL | 0 refills | Status: DC

## 2023-11-03 MED ORDER — TRIAMCINOLONE ACETONIDE 0.025 % EX OINT: 1.0000 | TOPICAL_OINTMENT | Freq: Two times a day (BID) | CUTANEOUS | 0 refills | Status: AC

## 2023-11-03 NOTE — Telephone Encounter (Signed)
  Chief Complaint: itching and swelling Symptoms: itching and swelling bilateral thigh to ankles Frequency: started yesterday Pertinent Negatives: Patient denies bleeding Disposition: [] ED /[] Urgent Care (no appt availability in office) / [x] Appointment(In office/virtual)/ []  [facility]/ [] Home Care/ [] Refused Recommended Disposition /[]  Mobile Bus/ []  Follow-up with PCP Additional Notes: pt took benadryl last night that helped but itching has started again. Virtual appt made for today. Home care advice given Reason for Disposition  [1] MODERATE-SEVERE local itching (i.e., interferes with work, school, activities) AND [2] not improved after 24 hours of hydrocortisone cream  Answer Assessment - Initial Assessment Questions 1. DESCRIPTION: Describe the itching you are having. Where is it located?     Bilateral from thighs to ankle 2. SEVERITY: How bad is it?    - MILD: Doesn't interfere with normal activities.   - MODERATE-SEVERE: Interferes with work, school, sleep, or other activities.      moderate 3. SCRATCHING: Are there any scratch marks? Bleeding?     no 4. ONSET: When did the itching begin?      yesterday 5. CAUSE: What do you think is causing the itching?      unknown 6. OTHER SYMPTOMS: Do you have any other symptoms?      Bilateral swelling with itching from thighs to ankles  7. PREGNANCY: Is there any chance you are pregnant? When was your last menstrual period?     no  Protocols used: Itching - Localized-A-AH

## 2023-11-03 NOTE — Patient Instructions (Signed)
 Yolanda Herrera, thank you for joining [name], NP for today's virtual visit.  While this provider is not your primary care provider (PCP), if your PCP is located in our provider database this encounter information will be shared with them immediately following your visit.   A [name] MyChart account gives you access to today's visit and all your visits, tests, and labs performed at [facility]  click here if you don't have a [name] MyChart account or go to [email or web address]  Consent: (Patient) Yolanda Herrera provided verbal consent for this virtual visit at the beginning of the encounter.  Current Medications:  Current Outpatient Medications:    predniSONE  (STERAPRED UNI-PAK 21 TAB) 10 MG (21) TBPK tablet, Take as directed, Disp: 21 tablet, Rfl: 0   triamcinolone  (KENALOG ) 0.025 % ointment, Apply 1 Application topically 2 (two) times daily., Disp: 30 g, Rfl: 0   albuterol  (VENTOLIN  HFA) 108 (90 Base) MCG/ACT inhaler, Inhale 2 puffs into the lungs every 4 (four) hours as needed for wheezing or shortness of breath., Disp: 8 g, Rfl: 0   amLODipine  (NORVASC ) 10 MG tablet, Take 1 tablet (10 mg total) by mouth daily. (Patient taking differently: Take 5 mg by mouth daily.), Disp: , Rfl:    atorvastatin  (LIPITOR) 20 MG tablet, Take 1 tablet (20 mg total) by mouth daily., Disp: 90 tablet, Rfl: 0   blood glucose meter kit and supplies, per insurance preference. Use up to three times daily as directed. E1.9, Z79.4 Please dispense lancets and glucose test stripes to match glucometer, 100 each, Disp: 1 each, Rfl: 11   ciclopirox (PENLAC) 8 % solution, Apply topically., Disp: , Rfl:    Continuous Glucose Sensor (FREESTYLE LIBRE 3 PLUS SENSOR) MISC, Use one device every 14 (fourteen) days., Disp: 6 each, Rfl: 1   gabapentin  (NEURONTIN ) 100 MG capsule, Take 1 capsule (100 mg total) by mouth 3 (three) times daily., Disp: 90 capsule, Rfl: 0   glucose blood (ONETOUCH  VERIO) test strip, 1 each by Other route 3 (three) times daily. Use as instructed, Disp: 300 each, Rfl: 3   HUMALOG  KWIKPEN 100 UNIT/ML KwikPen, Inject 10 Units into the skin 3 (three) times daily., Disp: 15 mL, Rfl: 1   Insulin  Glargine (BASAGLAR  KWIKPEN) 100 UNIT/ML, Inject 40 Units into the skin daily., Disp: 45 mL, Rfl: 3   Insulin  Pen Needle (BD PEN NEEDLE NANO 2ND GEN) 32G X 4 MM MISC, USE TO INJECT INSULIN  IN THE MORNING, AT NOON, IN THE EVENING AND AT BEDTIME., Disp: 400 each, Rfl: 5   irbesartan  (AVAPRO ) 150 MG tablet, Take 1 tablet (150 mg total) by mouth daily., Disp: 90 tablet, Rfl: 0   metFORMIN  (GLUCOPHAGE  XR) 500 MG 24 hr tablet, Take 1 tablet (500 mg total) by mouth in the morning and at bedtime., Disp: 180 tablet, Rfl: 3   methocarbamol  (ROBAXIN ) 500 MG tablet, Take 1 tablet (500 mg total) by mouth every 8 (eight) hours as needed for muscle spasms., Disp: 21 tablet, Rfl: 0   metoprolol  succinate (TOPROL -XL) 100 MG 24 hr tablet, TAKE 1 TABLET BY MOUTH ONCE DAILY.TAKE WITH OR IMMEDIATELY FOLLOWING A MEAL., Disp: 90 tablet, Rfl: 0   omeprazole  (PRILOSEC) 20 MG capsule, Take 1 capsule (20 mg total) by mouth 2 (two) times daily before a meal., Disp: 180 capsule, Rfl: 1   pioglitazone  (ACTOS ) 15 MG tablet, Take 1 tablet (15 mg total) by mouth daily., Disp: 90 tablet, Rfl: 3   spironolactone  (ALDACTONE ) 100 MG tablet, Take  1 tablet (100 mg total) by mouth daily., Disp: 90 tablet, Rfl: 3   Medications ordered in this encounter:  Meds ordered this encounter  Medications   predniSONE  (STERAPRED UNI-PAK 21 TAB) 10 MG (21) TBPK tablet    Sig: Take as directed    Dispense:  21 tablet    Refill:  0    Supervising Provider:   LAMPTEY, PHILIP O [[id number]]   triamcinolone  (KENALOG ) 0.025 % ointment    Sig: Apply 1 Application topically 2 (two) times daily.    Dispense:  30 g    Refill:  0    Supervising Provider:   BLAISE ALEENE KIDD [[id number]]     *If you need refills on other medications  prior to your next appointment, please contact your pharmacy*  Follow-Up: Call back or seek an in-person evaluation if the symptoms worsen or if the condition fails to improve as anticipated.  [facility] [phone]  Other Instructions  -follow up if not improving or worsening   If you have been instructed to have an in-person evaluation today at a local Urgent Care facility, please use the link below. It will take you to a list of all of our available [name], including address, phone number and hours of operation. Please do not delay care.  [facility]  If you or a family member do not have a primary care provider, use the link below to schedule a visit and establish care. When you choose a [facility] primary care physician or advanced practice provider, you gain a long-term partner in health. Find a Primary Care Provider  Learn more about [facility]'s in-office and virtual care options: [facility] - Get Care Now

## 2023-11-03 NOTE — Progress Notes (Signed)
 Virtual Visit Consent   [name], you are scheduled for a virtual visit with a [facility] provider today. Just as with appointments in the office, your consent must be obtained to participate. Your consent will be active for this visit and any virtual visit you may have with one of our providers in the next 365 days. If you have a MyChart account, a copy of this consent can be sent to you electronically.  As this is a virtual visit, video technology does not allow for your provider to perform a traditional examination. This may limit your provider's ability to fully assess your condition. If your provider identifies any concerns that need to be evaluated in person or the need to arrange testing (such as labs, EKG, etc.), we will make arrangements to do so. Although advances in technology are sophisticated, we cannot ensure that it will always work on either your end or our end. If the connection with a video visit is poor, the visit may have to be switched to a telephone visit. With either a video or telephone visit, we are not always able to ensure that we have a secure connection.  By engaging in this virtual visit, you consent to the provision of healthcare and authorize for your insurance to be billed (if applicable) for the services provided during this visit. Depending on your insurance coverage, you may receive a charge related to this service.  I need to obtain your verbal consent now. Are you willing to proceed with your visit today? [name] has provided verbal consent on 11/03/2023 for a virtual visit (video or telephone). [name], NP  Date: 11/03/2023 4:15 PM  Virtual Visit via Video Note   I, [name], connected with  [name]  ([id number], [birth year]) on 11/03/23 at  4:15 PM EST by a video-enabled telemedicine application and verified that I am speaking with the correct person using two identifiers.  Location: Patient: Virtual Visit Location  Patient: Home Provider: Virtual Visit Location Provider: Home Office   I discussed the limitations of evaluation and management by telemedicine and the availability of in person appointments. The patient expressed understanding and agreed to proceed.    History of Present Illness: Yolanda Herrera is a 53 y.o. who identifies as a female who was assigned female at birth, and is being seen today for itching and swelling  Onset was yesterday with itching with legs and ankles (when scratching hives like bumps coming up)   She had flu like symptoms a week ago and some ear pain with ringing for three days and then the itching and legs swelling started.   Modifying factors are benadryl, robitussin cough  Denies chest pain, shortness of breath, fevers, chills,  Denies new foods, new medications, new hygiene products- soaps or alike, no insect bites, or injury or traumas.  Is a DM- blood sugar is doing ok- it was 163 this morning, and then came down 108.    Problems:  Patient Active Problem List   Diagnosis Date Noted   Long term (current) use of oral hypoglycemic drugs 07/21/2023   Encounter for screening for other metabolic disorders 03/08/2023   Gastroesophageal reflux disease 06/02/2022   Essential hypertension 04/11/2020   Type 2 diabetes mellitus with hyperglycemia, with long-term current use of insulin  (HCC) 01/11/2020   Type 2 diabetes mellitus with retinopathy of right eye, with long-term current use of insulin  (HCC) 01/11/2020   Dyslipidemia 01/11/2020   Type 2 diabetes mellitus without complication, with  long-term current use of insulin  (HCC) 02/01/2018   Resistant hypertension 02/01/2018   Hyperlipidemia 02/01/2018    Allergies: No Known Allergies Medications:  Current Outpatient Medications:    albuterol  (VENTOLIN  HFA) 108 (90 Base) MCG/ACT inhaler, Inhale 2 puffs into the lungs every 4 (four) hours as needed for wheezing or shortness of breath., Disp: 8 g, Rfl: 0    amLODipine  (NORVASC ) 10 MG tablet, Take 1 tablet (10 mg total) by mouth daily. (Patient taking differently: Take 5 mg by mouth daily.), Disp: , Rfl:    atorvastatin  (LIPITOR) 20 MG tablet, Take 1 tablet (20 mg total) by mouth daily., Disp: 90 tablet, Rfl: 0   blood glucose meter kit and supplies, per insurance preference. Use up to three times daily as directed. E1.9, Z79.4 Please dispense lancets and glucose test stripes to match glucometer, 100 each, Disp: 1 each, Rfl: 11   ciclopirox (PENLAC) 8 % solution, Apply topically., Disp: , Rfl:    Continuous Glucose Sensor (FREESTYLE LIBRE 3 PLUS SENSOR) MISC, Use one device every 14 (fourteen) days., Disp: 6 each, Rfl: 1   gabapentin  (NEURONTIN ) 100 MG capsule, Take 1 capsule (100 mg total) by mouth 3 (three) times daily., Disp: 90 capsule, Rfl: 0   glucose blood (ONETOUCH VERIO) test strip, 1 each by Other route 3 (three) times daily. Use as instructed, Disp: 300 each, Rfl: 3   HUMALOG  KWIKPEN 100 UNIT/ML KwikPen, Inject 10 Units into the skin 3 (three) times daily., Disp: 15 mL, Rfl: 1   Insulin  Glargine (BASAGLAR  KWIKPEN) 100 UNIT/ML, Inject 40 Units into the skin daily., Disp: 45 mL, Rfl: 3   Insulin  Pen Needle (BD PEN NEEDLE NANO 2ND GEN) 32G X 4 MM MISC, USE TO INJECT INSULIN  IN THE MORNING, AT NOON, IN THE EVENING AND AT BEDTIME., Disp: 400 each, Rfl: 5   irbesartan  (AVAPRO ) 150 MG tablet, Take 1 tablet (150 mg total) by mouth daily., Disp: 90 tablet, Rfl: 0   metFORMIN  (GLUCOPHAGE  XR) 500 MG 24 hr tablet, Take 1 tablet (500 mg total) by mouth in the morning and at bedtime., Disp: 180 tablet, Rfl: 3   methocarbamol  (ROBAXIN ) 500 MG tablet, Take 1 tablet (500 mg total) by mouth every 8 (eight) hours as needed for muscle spasms., Disp: 21 tablet, Rfl: 0   metoprolol  succinate (TOPROL -XL) 100 MG 24 hr tablet, TAKE 1 TABLET BY MOUTH ONCE DAILY.TAKE WITH OR IMMEDIATELY FOLLOWING A MEAL., Disp: 90 tablet, Rfl: 0   omeprazole  (PRILOSEC) 20 MG capsule, Take  1 capsule (20 mg total) by mouth 2 (two) times daily before a meal., Disp: 180 capsule, Rfl: 1   pioglitazone  (ACTOS ) 15 MG tablet, Take 1 tablet (15 mg total) by mouth daily., Disp: 90 tablet, Rfl: 3   spironolactone  (ALDACTONE ) 100 MG tablet, Take 1 tablet (100 mg total) by mouth daily., Disp: 90 tablet, Rfl: 3  Observations/Objective: Patient is well-developed, well-nourished in no acute distress.  Resting comfortably at home.  Head is normocephalic, atraumatic.  No labored breathing.  Speech is clear and coherent with logical content.  Patient is alert and oriented at baseline.    Assessment and Plan:  1. Rash (Primary)  - predniSONE  (STERAPRED UNI-PAK 21 TAB) 10 MG (21) TBPK tablet; Take as directed  Dispense: 21 tablet; Refill: 0 - triamcinolone  (KENALOG ) 0.025 % ointment; Apply 1 Application topically 2 (two) times daily.  Dispense: 30 g; Refill: 0  -unknown etiology -steroids and follow up in person strict precautions.  - no red flags, no  breathing trouble  Reviewed side effects, risks and benefits of medication.    Patient acknowledged agreement and understanding of the plan.   Past Medical, Surgical, Social History, Allergies, and Medications have been Reviewed.       Follow Up Instructions: I discussed the assessment and treatment plan with the patient. The patient was provided an opportunity to ask questions and all were answered. The patient agreed with the plan and demonstrated an understanding of the instructions.  A copy of instructions were sent to the patient via MyChart unless otherwise noted below.    The patient was advised to call back or seek an in-person evaluation if the symptoms worsen or if the condition fails to improve as anticipated.    [name], NP

## 2023-11-23 ENCOUNTER — Other Ambulatory Visit: Payer: Self-pay | Admitting: Internal Medicine

## 2023-11-23 ENCOUNTER — Other Ambulatory Visit: Payer: Self-pay | Admitting: *Deleted

## 2023-11-23 DIAGNOSIS — Z794 Long term (current) use of insulin: Secondary | ICD-10-CM

## 2023-11-23 MED ORDER — HUMALOG KWIKPEN 100 UNIT/ML ~~LOC~~ SOPN: 10.0000 [IU] | PEN_INJECTOR | Freq: Three times a day (TID) | SUBCUTANEOUS | 1 refills | Status: DC

## 2023-11-24 ENCOUNTER — Other Ambulatory Visit: Payer: Self-pay | Admitting: Internal Medicine

## 2023-11-24 DIAGNOSIS — I10 Essential (primary) hypertension: Secondary | ICD-10-CM

## 2023-11-25 ENCOUNTER — Ambulatory Visit: Payer: 59 | Admitting: Internal Medicine

## 2023-11-25 NOTE — Progress Notes (Deleted)
Name: Yolanda Herrera  Age/ Sex: 53 y.o., female   MRN/ DOB: [id number], 03-29-1971     PCP: Alveria Apley, NP   Reason for Endocrinology Evaluation: Type 2 Diabetes Mellitus  Initial Endocrine Consultative Visit: 01/11/2020    PATIENT IDENTIFIER: [name] is a 53 y.o. female with a past medical history of DM and dyslipidemia. The patient has followed with Endocrinology clinic since 01/11/2020 for consultative assistance with management of her diabetes.  DIABETIC HISTORY:  [name] was diagnosed with DM in 2011.  She has been on Metformin since her diagnosis, as well as glimepiride.  Yolanda Herrera was cost prohibitive.  Ozempic was started in 12/2019.  Her hemoglobin A1c has ranged from 9.2% in 2019, peaking at 14.9% in 2021.   On her initial visit to our clinic, her A1c was 14.9%   , she was on Metformin, glimepiride, Ozempic, and NPH.  We stopped  glimepiride and NPH, we increase Metformin, continue to Ozempic and started Lantus.   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   Dexamethasone suppression test normal 08/2021   She was evaluated by GI for pancreatic cyst 07/2021     SUBJECTIVE:   During the last visit (07/21/2023): A1c 14.0%    Today (11/25/2023): [name] is here for follow-up on diabetes management. She has been checking glucose occasionally at work    She has been eating so much fruits  Constipation has improved   Denies nausea or vomiting   GLP-1 agonist are > $300   HOME DIABETES REGIMEN:  Metformin 500 mg XR, 2 tabs daily Pioglitazone 15 mg daily Basaglar 46 units daily  Huamlog 12 units with each meal  CF: Humalog (BG-130/30) Avapro 150 mg daily Metoprolol 100 mg daily Amlodipine 5 mg daily spironolactone 100 mg daily    Statin: Yes ACE-I/ARB: Yes   METER DOWNLOAD SUMMARY: Did not bring  BG > 160 mg/dL    DIABETIC COMPLICATIONS: Microvascular complications:  Retinopathy ? Right,  Denies: neuropathy  Last eye exam: Completed 12/2019   Macrovascular complications:    Denies: CAD, PVD, CVA  HISTORY:  Past Medical History:  Past Medical History:  Diagnosis Date   Diabetes mellitus without complication (HCC)    GERD (gastroesophageal reflux disease)    Hyperlipidemia    Hypertension    Past Surgical History:  Past Surgical History:  Procedure Laterality Date   CESAREAN SECTION     heart echo     UTERINE FIBROID SURGERY     Social History:  reports that she has never smoked. She has never used smokeless tobacco. She reports that she does not drink alcohol and does not use drugs. Family History:  Family History  Problem Relation Age of Onset   Diabetes Mother    Diabetes Father    Kidney disease Father    Hypertension Father    Hypertension Sister    Stroke Sister  Diabetes Brother    Eczema Son      HOME MEDICATIONS: Allergies as of 11/25/2023   No Known Allergies      Medication List        Accurate as of November 25, 2023  6:58 AM. If you have any questions, ask your nurse or doctor.          albuterol 108 (90 Base) MCG/ACT inhaler Commonly known as: VENTOLIN HFA Inhale 2 puffs into the lungs every 4 (four) hours as needed for wheezing or shortness of breath.   amLODipine 10 MG tablet Commonly known as: NORVASC Take 1 tablet (10 mg total) by mouth daily. What changed: how much to take   atorvastatin 20 MG tablet Commonly known as:  LIPITOR Take 1 tablet (20 mg total) by mouth daily.   Basaglar KwikPen 100 UNIT/ML INJECT 40 UNITS SUBCUTANEOUSLY ONCE DAILY   BD Pen Needle Nano 2nd Gen 32G X 4 MM Misc Generic drug: Insulin Pen Needle USE 1  TO CHECK GLUCOSE 4 TIMES DAILY IN  THE  MORNING,  AT  NOON,  IN  THE  EVENING  AND  AT  BEDTIME   blood glucose meter kit and supplies per insurance preference. Use up to three times daily as directed. E1.9, Z79.4 Please dispense lancets and glucose test stripes to match glucometer, 100 each   ciclopirox 8 % solution Commonly known as: PENLAC Apply topically.   FreeStyle Libre 3 Sensor Misc Use one device every 14 (fourteen) days.   gabapentin 100 MG capsule Commonly known as: NEURONTIN Take 1 capsule (100 mg total) by mouth 3 (three) times daily.   HumaLOG KwikPen 100 UNIT/ML KwikPen Generic drug: insulin lispro Inject 10 Units into the skin 3 (three) times daily.   irbesartan 150 MG tablet Commonly known as: AVAPRO Take 1 tablet (150 mg total) by mouth daily.   metFORMIN 500 MG 24 hr tablet Commonly known as: Glucophage XR Take 1 tablet (500 mg total) by mouth in the morning and at bedtime.   methocarbamol 500 MG tablet Commonly known as: ROBAXIN Take 1 tablet (500 mg total) by mouth every 8 (eight) hours as needed for muscle spasms.   metoprolol succinate 100 MG 24 hr tablet Commonly known as: TOPROL-XL TAKE 1 TABLET BY MOUTH ONCE DAILY.TAKE WITH OR IMMEDIATELY FOLLOWING A MEAL.   omeprazole 20 MG capsule Commonly known as: PRILOSEC Take 1 capsule (20 mg total) by mouth 2 (two) times daily before a meal.   OneTouch Verio test strip Generic drug: glucose blood 1 each by Other route 3 (three) times daily. Use as instructed   pioglitazone 15 MG tablet Commonly known as: ACTOS Take 1 tablet (15 mg total) by mouth daily.   predniSONE 10 MG (21) Tbpk tablet Commonly known as: STERAPRED UNI-PAK 21 TAB Take as directed   spironolactone 100 MG  tablet Commonly known as: ALDACTONE Take 1 tablet (100 mg total) by mouth daily.   triamcinolone 0.025 % ointment Commonly known as: KENALOG Apply 1 Application topically 2 (two) times daily.         OBJECTIVE:   Vital Signs: LMP 01/18/2018 (Approximate)   Wt Readings from Last 3 Encounters:  09/13/23 170 lb 1.6 oz (77.2 kg)  08/30/23 163 lb (73.9 kg)  08/23/23 170 lb 12.8 oz (77.5 kg)     Exam: General: Pt appears well and is in NAD  Lungs: Clear with good BS bilat   Heart: RRR  Abdomen:  soft, nontender  Extremities: No  pretibial edema. .  Neuro: MS is good with appropriate affect, pt is alert and Ox3    DM foot exam: 07/21/2023 The skin of the feet is intact without sores or ulcerations. The pedal pulses are 2+ on right and 2+ on left. The sensation is decreased  to a screening 5.07, 10 gram monofilament on the right    DATA REVIEWED:  Lab Results  Component Value Date   HGBA1C >14.0 (H) 06/14/2023   HGBA1C >14.0 (H) 03/10/2023   HGBA1C 11.5 (H) 11/30/2022    Latest Reference Range & Units 06/14/23 11:35  Sodium 135 - 145 mEq/L 135  Potassium 3.5 - 5.1 mEq/L 4.2  Chloride 96 - 112 mEq/L 98  CO2 19 - 32 mEq/L 29  Glucose 70 - 99 mg/dL 829 (H)  BUN 6 - 23 mg/dL 11  Creatinine 5.62 - 1.30 mg/dL 8.65  Calcium 8.4 - 78.4 mg/dL 9.3  Alkaline Phosphatase 39 - 117 U/L 94  Albumin 3.5 - 5.2 g/dL 4.3  Lipase 69.6 - 29.5 U/L 34.0  AST 0 - 37 U/L 21  ALT 0 - 35 U/L 29  Total Protein 6.0 - 8.3 g/dL 7.5  Total Bilirubin 0.2 - 1.2 mg/dL 0.4  GFR >28.41 mL/min 70.43  (H): Data is abnormally high    CT abdomen 06/05/2021   IMPRESSION: 1. Bilateral adrenal glands appear normal.    In office BG *** mg/dL    ASSESSMENT / PLAN / RECOMMENDATIONS:   1) Type 2 Diabetes Mellitus, poorly controlled, With retinopathic complications - Most recent A1c of  14.0  %. Goal A1c < 7.0 %.     -Unfortunately SGLT2 inhibitors and GLP-1 agonist have become cost  prohibitive and is not able to obtain any of them -A prescription for freestyle Yolanda Herrera has been sent to see if this will be covered -I will start her on pioglitazone, to improve her insulin resistance as she is unable to get SGLT2 inhibitors nor GLP-1 agonist  MEDICATIONS: -Continue metformin 500 mg XR, 2 tabs daily -Start Actos 15 mg daily -Increase Basaglar 46 units daily -Increase Humalog 12 units with each meal -Start correction factor: Humalog (BG -130/30) TIDQAC   EDUCATION / INSTRUCTIONS: BG monitoring instructions: Patient is instructed to check her blood sugars 1 times a day. Call [facility] if: BG persistently < 70  I reviewed the Rule of 15 for the treatment of hypoglycemia in detail with the patient. Literature supplied.    2) Diabetic complications:  Eye: Does have known diabetic retinopathy.  Neuro/ Feet: Does not have known diabetic peripheral neuropathy. Renal: Patient does not have known baseline CKD. She is  on an ACEI/ARB at present   3)Hyperaldosteronism:   -In review of records she has been noted to have a suppressed renin<0.167, aldosterone of 17.5 with an elevated Aldo/renin ratio> 104.8, this was done in 2019 through cardiology, she was started on Spironolactone at the time  - CT scan of adrenal did not show evidence of adenoma 06/2021 - Hyperaldo confirmation offered , but pt opted for medical management with Spironolactone  -BP is well controlled today, will decrease amlodipine by 50%   Continue spironolactone 100 mg daily Continue Avapro150 mg daily Continue metoprolol 100 mg daily Decrease amlodipine 5 mg daily    F/U in 6 months  Signed electronically by: Lyndle Herrlich, MD  [facility]  [facility] [address] [place] [place], [place] [address] Phone: [phone] FAX: [phone]   CC: Zandra Abts  R, NP [address] [place] [address] Phone: [phone]   Fax: [phone]  Return to Endocrinology clinic as below: Future Appointments  Date Time Provider Department Center  11/25/2023  9:10 AM Reggie Bise, Konrad Dolores, MD [id number] None

## 2023-12-05 ENCOUNTER — Other Ambulatory Visit: Payer: Self-pay | Admitting: Family

## 2023-12-05 DIAGNOSIS — I10 Essential (primary) hypertension: Secondary | ICD-10-CM

## 2023-12-05 DIAGNOSIS — E785 Hyperlipidemia, unspecified: Secondary | ICD-10-CM

## 2023-12-05 DIAGNOSIS — K219 Gastro-esophageal reflux disease without esophagitis: Secondary | ICD-10-CM

## 2023-12-23 ENCOUNTER — Other Ambulatory Visit: Payer: Self-pay

## 2023-12-23 DIAGNOSIS — E785 Hyperlipidemia, unspecified: Secondary | ICD-10-CM

## 2023-12-23 MED ORDER — ATORVASTATIN CALCIUM 20 MG PO TABS: 20.0000 mg | ORAL_TABLET | Freq: Every day | ORAL | 0 refills | Status: DC

## 2024-01-20 ENCOUNTER — Ambulatory Visit: Payer: 59 | Admitting: Internal Medicine

## 2024-01-20 VITALS — BP 124/80 | HR 95 | Ht 63.0 in | Wt 177.2 lb

## 2024-01-20 DIAGNOSIS — Z794 Long term (current) use of insulin: Secondary | ICD-10-CM

## 2024-01-20 DIAGNOSIS — E1165 Type 2 diabetes mellitus with hyperglycemia: Secondary | ICD-10-CM | POA: Diagnosis not present

## 2024-01-20 DIAGNOSIS — I10 Essential (primary) hypertension: Secondary | ICD-10-CM | POA: Diagnosis not present

## 2024-01-20 LAB — POCT GLYCOSYLATED HEMOGLOBIN (HGB A1C): HbA1c POC (<> result, manual entry): >15 % (ref 4.0–5.6)

## 2024-01-20 LAB — POCT GLUCOSE (DEVICE FOR HOME USE): POC Glucose: 344 mg/dL — AB (ref 70–99)

## 2024-01-20 MED ORDER — METFORMIN HCL ER 500 MG PO TB24: 500.0000 mg | ORAL_TABLET | Freq: Two times a day (BID) | ORAL | 3 refills | Status: AC

## 2024-01-20 MED ORDER — BASAGLAR KWIKPEN 100 UNIT/ML ~~LOC~~ SOPN: 50.0000 [IU] | PEN_INJECTOR | Freq: Every day | SUBCUTANEOUS | 4 refills | Status: AC

## 2024-01-20 MED ORDER — INSULIN LISPRO (1 UNIT DIAL) 100 UNIT/ML (KWIKPEN): 14.0000 [IU] | PEN_INJECTOR | Freq: Three times a day (TID) | SUBCUTANEOUS | 3 refills | Status: DC

## 2024-01-20 MED ORDER — SEMAGLUTIDE(0.25 OR 0.5MG/DOS) 2 MG/3ML ~~LOC~~ SOPN: 0.5000 mg | PEN_INJECTOR | SUBCUTANEOUS | 3 refills | Status: DC

## 2024-01-20 MED ORDER — PIOGLITAZONE HCL 30 MG PO TABS: 30.0000 mg | ORAL_TABLET | Freq: Every day | ORAL | 3 refills | Status: AC

## 2024-01-20 MED ORDER — AMLODIPINE BESYLATE 10 MG PO TABS: 10.0000 mg | ORAL_TABLET | Freq: Every day | ORAL | 3 refills | Status: DC

## 2024-01-20 MED ORDER — BD PEN NEEDLE NANO 2ND GEN 32G X 4 MM MISC: 1.0000 | Freq: Four times a day (QID) | 3 refills | Status: AC

## 2024-01-20 NOTE — Progress Notes (Signed)
 Name: Yolanda Herrera  Age/ Sex: 53 y.o., female   MRN/ DOB: [id number], 02/17/1971     PCP: Alveria Apley, NP   Reason for Endocrinology Evaluation: Type 2 Diabetes Mellitus  Initial Endocrine Consultative Visit: 01/11/2020    PATIENT IDENTIFIER: Yolanda Herrera is a 53 y.o. female with a past medical history of DM and dyslipidemia. The patient has followed with Endocrinology clinic since 01/11/2020 for consultative assistance with management of her diabetes.  DIABETIC HISTORY:  Yolanda Herrera was diagnosed with DM in 2011.  She has been on Metformin since her diagnosis, as well as glimepiride.  [place] was cost prohibitive.  Ozempic was started in 12/2019.  Her hemoglobin A1c has ranged from 9.2% in 2019, peaking at 14.9% in 2021.   On her initial visit to our clinic, her A1c was 14.9%   , she was on Metformin, glimepiride, Ozempic, and NPH.  We stopped  glimepiride and NPH, we increase Metformin, continue to Ozempic and started Lantus.   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   Dexamethasone suppression test normal 08/2021   She was evaluated by GI for pancreatic cyst 07/2021   Started Actos 07/2023 with an A1c of 14.0%  SUBJECTIVE:   During the last visit (07/21/2023): A1c 14.0%    Today (01/20/2024): Yolanda Herrera is here for follow-up on diabetes management. She doesn't check glucose at home, dexcom has been cost prohibitive .  She only  checks glucose when she is at work at times    Denies nausea or vomiting  Denies constipation or diarrhea  She denies LE edema  Has been tired     HOME DIABETES REGIMEN:  Metformin 500 mg XR, 2 tabs daily Actos 15 mg daily Basaglar 46 units daily - takes 40 Huamlog 12 units with each meal - takes 14 CF: Humalog (BG-130/30) TIDQAC Avapro 150 mg daily Metoprolol 100 mg daily Amlodipine 10 mg daily spironolactone 100 mg daily    Statin: Yes ACE-I/ARB: Yes   METER DOWNLOAD SUMMARY: Did not bring  BG > 160 mg/dL    DIABETIC COMPLICATIONS: Microvascular complications:  Retinopathy ? Right,  Denies: neuropathy  Last eye exam: Completed 12/2019   Macrovascular complications:    Denies: CAD, PVD, CVA  HISTORY:  Past Medical History:  Past Medical History:  Diagnosis Date   Diabetes mellitus without complication (HCC)    GERD (gastroesophageal reflux disease)    Hyperlipidemia    Hypertension    Past Surgical History:  Past Surgical History:  Procedure Laterality Date   CESAREAN SECTION     heart echo     UTERINE FIBROID SURGERY     Social History:  reports that she has never smoked. She has never used smokeless tobacco. She reports that she does not drink alcohol and does not use drugs. Family History:  Family History  Problem Relation Age of Onset   Diabetes Mother    Diabetes  Father    Kidney disease Father    Hypertension Father    Hypertension Sister    Stroke Sister    Diabetes Brother    Eczema Son      HOME MEDICATIONS: Allergies as of 01/20/2024   No Known Allergies      Medication List        Accurate as of January 20, 2024 11:48 AM. If you have any questions, ask your nurse or doctor.          albuterol 108 (90 Base) MCG/ACT inhaler Commonly known as: VENTOLIN HFA Inhale 2 puffs into the lungs every 4 (four) hours as needed for wheezing or shortness of breath.   amLODipine 10 MG tablet Commonly known as: NORVASC Take 1 tablet  (10 mg total) by mouth daily. What changed: how much to take   amLODipine 5 MG tablet Commonly known as: NORVASC Take 5 mg by mouth daily. What changed: Another medication with the same name was changed. Make sure you understand how and when to take each.   atorvastatin 20 MG tablet Commonly known as: LIPITOR Take 1 tablet (20 mg total) by mouth daily.   Basaglar KwikPen 100 UNIT/ML INJECT 40 UNITS SUBCUTANEOUSLY ONCE DAILY   BD Pen Needle Nano 2nd Gen 32G X 4 MM Misc Generic drug: Insulin Pen Needle USE 1  TO CHECK GLUCOSE 4 TIMES DAILY IN  THE  MORNING,  AT  NOON,  IN  THE  EVENING  AND  AT  BEDTIME   blood glucose meter kit and supplies per insurance preference. Use up to three times daily as directed. E1.9, Z79.4 Please dispense lancets and glucose test stripes to match glucometer, 100 each   ciclopirox 8 % solution Commonly known as: PENLAC Apply topically.   FreeStyle Libre 3 Sensor Misc Use one device every 14 (fourteen) days.   gabapentin 100 MG capsule Commonly known as: NEURONTIN Take 1 capsule (100 mg total) by mouth 3 (three) times daily.   HumaLOG KwikPen 100 UNIT/ML KwikPen Generic drug: insulin lispro Inject 10 Units into the skin 3 (three) times daily.   irbesartan 150 MG tablet Commonly known as: AVAPRO Take 1 tablet (150 mg total) by mouth daily.   metFORMIN 500 MG 24 hr tablet Commonly known as: Glucophage XR Take 1 tablet (500 mg total) by mouth in the morning and at bedtime.   methocarbamol 500 MG tablet Commonly known as: ROBAXIN Take 1 tablet (500 mg total) by mouth every 8 (eight) hours as needed for muscle spasms.   metoprolol succinate 100 MG 24 hr tablet Commonly known as: TOPROL-XL TAKE 1 TABLET BY MOUTH ONCE DAILY.TAKE WITH OR IMMEDIATELY FOLLOWING A MEAL.   omeprazole 20 MG capsule Commonly known as: PRILOSEC Take 1 capsule (20 mg total) by mouth 2 (two) times daily before a meal.   OneTouch Verio test strip Generic drug:  glucose blood 1 each by Other route 3 (three) times daily. Use as instructed   pioglitazone 15 MG tablet Commonly known as: ACTOS Take 1 tablet (15 mg total) by mouth daily.   predniSONE 10 MG (21) Tbpk tablet Commonly known as: STERAPRED UNI-PAK 21 TAB Take as directed   spironolactone 100 MG tablet Commonly known as: ALDACTONE Take 1 tablet (100 mg total) by mouth daily.   triamcinolone 0.025 % ointment Commonly known as: KENALOG Apply 1 Application topically 2 (two) times daily.         OBJECTIVE:   Vital Signs: BP 124/80 (BP Location: Left  Arm, Patient Position: Sitting, Cuff Size: Normal)   Pulse 95   Ht 5\' 3"  (1.6 m)   Wt 177 lb 3.2 oz (80.4 kg)   LMP 01/18/2018 (Approximate)   SpO2 98%   BMI 31.39 kg/m   Wt Readings from Last 3 Encounters:  01/20/24 177 lb 3.2 oz (80.4 kg)  09/13/23 170 lb 1.6 oz (77.2 kg)  08/30/23 163 lb (73.9 kg)     Exam: General: Pt appears well and is in NAD  Lungs: Clear with good BS bilat   Heart: RRR  Abdomen:  soft, nontender  Extremities: No pretibial edema. .  Neuro: MS is good with appropriate affect, pt is alert and Ox3    DM foot exam: 07/21/2023 The skin of the feet is intact without sores or ulcerations. The pedal pulses are 2+ on right and 2+ on left. The sensation is decreased  to a screening 5.07, 10 gram monofilament on the right    DATA REVIEWED:  Lab Results  Component Value Date   HGBA1C >15.0 01/20/2024   HGBA1C >14.0 (H) 06/14/2023   HGBA1C >14.0 (H) 03/10/2023    Latest Reference Range & Units 06/14/23 11:35  Sodium 135 - 145 mEq/L 135  Potassium 3.5 - 5.1 mEq/L 4.2  Chloride 96 - 112 mEq/L 98  CO2 19 - 32 mEq/L 29  Glucose 70 - 99 mg/dL 119 (H)  BUN 6 - 23 mg/dL 11  Creatinine 1.47 - 8.29 mg/dL 5.62  Calcium 8.4 - 13.0 mg/dL 9.3  Alkaline Phosphatase 39 - 117 U/L 94  Albumin 3.5 - 5.2 g/dL 4.3  Lipase 86.5 - 78.4 U/L 34.0  AST 0 - 37 U/L 21  ALT 0 - 35 U/L 29  Total Protein 6.0 - 8.3 g/dL  7.5  Total Bilirubin 0.2 - 1.2 mg/dL 0.4  GFR >69.62 mL/min 70.43  (H): Data is abnormally high    CT abdomen 06/05/2021   IMPRESSION: 1. Bilateral adrenal glands appear normal.    In office BG 309 mg/dL  ASSESSMENT / PLAN / RECOMMENDATIONS:   1) Type 2 Diabetes Mellitus, poorly controlled, With retinopathic complications - Most recent A1c of > 15.0%  %. Goal A1c < 7.0 %.   -I am perplexed with this case, her A1c is not consistent with insulin intake but she assures me that she takes it on a regular basis.  My assistant went over injection technique, and she has been taking less Basaglar than previously prescribed -She continues not to check glucose at home, Dexcom has been cost prohibitive, we again discussed the importance of using glucose meter and checking at least 3 times a day, that way she can use correction scale for hyperglycemia -Historically SGLT2 inhibitors and GLP-1 agonists have been cost prohibitive, but today she asked for a new prescription for Ozempic, which was sent -I will increase her Basaglar as below -Unable to increase or adjust Humalog due to lack of glucose data -Will increase pioglitazone  MEDICATIONS: -Continue metformin 500 mg XR, 2 tabs daily -Increase pioglitazone 30 mg daily -Increase Basaglar 50 units daily -Continue Humalog 14 units with each meal -Correction factor: Humalog (BG -130/30) TIDQAC   EDUCATION / INSTRUCTIONS: BG monitoring instructions: Patient is instructed to check her blood sugars 1 times a day. Call [facility] if: BG persistently < 70  I reviewed the Rule of 15 for the treatment of hypoglycemia in detail with the patient. Literature supplied.    2) Diabetic complications:  Eye: Does have known diabetic  retinopathy.  Neuro/ Feet: Does not have known diabetic peripheral neuropathy. Renal: Patient does not have known baseline CKD. She is  on an ACEI/ARB at present   3)Hyperaldosteronism:   -In review of  records she has been noted to have a suppressed renin<0.167, aldosterone of 17.5 with an elevated Aldo/renin ratio> 104.8, this was done in 2019 through cardiology, she was started on Spironolactone at the time  - CT scan of adrenal did not show evidence of adenoma 06/2021 - Hyperaldo confirmation offered , but pt opted for medical management with Spironolactone  -BP is well controlled today -I have attempted to decrease amlodipine in the past, but per patient that resulted in hypertension, no changes   Continue spironolactone 100 mg daily Continue Avapro150 mg daily Continue metoprolol 100 mg daily Continue amlodipine 10 mg daily    F/U in 3 months  Signed electronically by: Lyndle Herrlich, MD  [facility]  [facility] [address] [place], [place] [address] Phone: [phone] FAX: [phone]   CC: Alveria Apley, NP [address] [place] [address] Phone: [phone]  Fax: [phone]  Return to Endocrinology clinic as below: Future Appointments  Date Time Provider Department Center  01/20/2024 11:50 AM Yolanda Herrera, Konrad Dolores, MD [id number] None

## 2024-01-20 NOTE — Patient Instructions (Addendum)
-   Continue Metformin 500 mg XR , 2 tablets daily - Increase pioglitazone  30 mg, 1 tablet daily -Increase Basaglar 50 units once daily -Continue  Humalog 14 units with each meal plus correction scale if needed as below -Humalog correctional insulin: ADD extra units on insulin to your meal-time Humalog  dose if your blood sugars are higher than 160. Use the scale below to help guide you:   Blood sugar before meal Number of units to inject  Less than 160 0 unit  161 -  190 1 units  191 -  220 2 units  221 -  250 3 units  251 -  280 4 units  281 -  310 5 units  311 -  340 6 units  341 -  370 7 units  371 -  400 8 units  401 - 430 9 units      - HOW TO TREAT LOW BLOOD SUGARS (Blood sugar LESS THAN 70 MG/DL) Please follow the RULE OF 15 for the treatment of hypoglycemia treatment (when your (blood sugars are less than 70 mg/dL)   STEP 1: Take 15 grams of carbohydrates when your blood sugar is low, which includes:  3-4 GLUCOSE TABS  OR 3-4 OZ OF JUICE OR REGULAR SODA OR ONE TUBE OF GLUCOSE GEL    STEP 2: RECHECK blood sugar in 15 MINUTES STEP 3: If your blood sugar is still low at the 15 minute recheck --> then, go back to STEP 1 and treat AGAIN with another 15 grams of carbohydrates.

## 2024-01-27 ENCOUNTER — Other Ambulatory Visit: Payer: Self-pay

## 2024-01-27 DIAGNOSIS — I10 Essential (primary) hypertension: Secondary | ICD-10-CM

## 2024-01-27 MED ORDER — METOPROLOL SUCCINATE ER 100 MG PO TB24: ORAL_TABLET | ORAL | 0 refills | Status: DC

## 2024-01-27 MED ORDER — IRBESARTAN 150 MG PO TABS: 150.0000 mg | ORAL_TABLET | Freq: Every day | ORAL | 0 refills | Status: DC

## 2024-02-01 ENCOUNTER — Other Ambulatory Visit (HOSPITAL_COMMUNITY): Payer: Self-pay

## 2024-02-10 ENCOUNTER — Telehealth: Payer: Self-pay

## 2024-02-10 ENCOUNTER — Other Ambulatory Visit (HOSPITAL_COMMUNITY): Payer: Self-pay

## 2024-02-10 NOTE — Telephone Encounter (Signed)
 Pharmacy Patient Advocate Encounter   Received notification from CoverMyMeds that prior authorization for Ozempic is required/requested.   Insurance verification completed.   The patient is insured through [facility] .   Per test claim: PA required; PA submitted to above mentioned insurance via CoverMyMeds Key/confirmation #/EOC [id number] Status is pending

## 2024-02-14 NOTE — Telephone Encounter (Signed)
 Pharmacy Patient Advocate Encounter  Received notification from [facility] that Prior Authorization for Ozempic has been APPROVED from 02-13-2024 to 02-12-2025   PA #/Case ID/Reference #: [id number]

## 2024-02-16 NOTE — Telephone Encounter (Signed)
 LVDTVM

## 2024-02-22 ENCOUNTER — Other Ambulatory Visit (HOSPITAL_BASED_OUTPATIENT_CLINIC_OR_DEPARTMENT_OTHER): Payer: Self-pay | Admitting: Family Medicine

## 2024-02-22 DIAGNOSIS — Z1231 Encounter for screening mammogram for malignant neoplasm of breast: Secondary | ICD-10-CM

## 2024-03-09 ENCOUNTER — Other Ambulatory Visit: Payer: Self-pay | Admitting: Family Medicine

## 2024-03-09 DIAGNOSIS — Z794 Long term (current) use of insulin: Secondary | ICD-10-CM

## 2024-03-12 ENCOUNTER — Other Ambulatory Visit: Payer: Self-pay | Admitting: Family Medicine

## 2024-03-12 ENCOUNTER — Encounter (HOSPITAL_COMMUNITY): Payer: Self-pay

## 2024-03-12 DIAGNOSIS — E1165 Type 2 diabetes mellitus with hyperglycemia: Secondary | ICD-10-CM

## 2024-03-19 ENCOUNTER — Ambulatory Visit (HOSPITAL_BASED_OUTPATIENT_CLINIC_OR_DEPARTMENT_OTHER)
Admission: RE | Admit: 2024-03-19 | Discharge: 2024-03-19 | Disposition: A | Discharge status: Home / Self Care | Source: Ambulatory Visit | Attending: Family Medicine | Admitting: Family Medicine

## 2024-03-19 ENCOUNTER — Encounter (HOSPITAL_BASED_OUTPATIENT_CLINIC_OR_DEPARTMENT_OTHER): Payer: Self-pay | Admitting: Radiology

## 2024-03-19 DIAGNOSIS — Z1231 Encounter for screening mammogram for malignant neoplasm of breast: Secondary | ICD-10-CM | POA: Insufficient documentation

## 2024-03-21 ENCOUNTER — Other Ambulatory Visit: Payer: Self-pay | Admitting: Family Medicine

## 2024-03-21 DIAGNOSIS — E785 Hyperlipidemia, unspecified: Secondary | ICD-10-CM

## 2024-03-22 ENCOUNTER — Ambulatory Visit: Payer: Self-pay | Admitting: Family Medicine

## 2024-03-29 ENCOUNTER — Encounter: Payer: Self-pay | Admitting: Family Medicine

## 2024-03-29 ENCOUNTER — Other Ambulatory Visit

## 2024-03-29 ENCOUNTER — Ambulatory Visit (INDEPENDENT_AMBULATORY_CARE_PROVIDER_SITE_OTHER): Admitting: Family Medicine

## 2024-03-29 VITALS — BP 152/100 | HR 82 | Temp 98.0°F | Ht 63.0 in | Wt 188.0 lb

## 2024-03-29 DIAGNOSIS — R7309 Other abnormal glucose: Secondary | ICD-10-CM

## 2024-03-29 DIAGNOSIS — Z794 Long term (current) use of insulin: Secondary | ICD-10-CM

## 2024-03-29 DIAGNOSIS — I10 Essential (primary) hypertension: Secondary | ICD-10-CM | POA: Diagnosis not present

## 2024-03-29 DIAGNOSIS — E6609 Other obesity due to excess calories: Secondary | ICD-10-CM

## 2024-03-29 DIAGNOSIS — E11319 Type 2 diabetes mellitus with unspecified diabetic retinopathy without macular edema: Secondary | ICD-10-CM

## 2024-03-29 DIAGNOSIS — Z6833 Body mass index (BMI) 33.0-33.9, adult: Secondary | ICD-10-CM

## 2024-03-29 DIAGNOSIS — E785 Hyperlipidemia, unspecified: Secondary | ICD-10-CM | POA: Diagnosis not present

## 2024-03-29 DIAGNOSIS — E66811 Obesity, class 1: Secondary | ICD-10-CM

## 2024-03-29 DIAGNOSIS — L299 Pruritus, unspecified: Secondary | ICD-10-CM

## 2024-03-29 DIAGNOSIS — K219 Gastro-esophageal reflux disease without esophagitis: Secondary | ICD-10-CM

## 2024-03-29 DIAGNOSIS — L309 Dermatitis, unspecified: Secondary | ICD-10-CM

## 2024-03-29 LAB — CBC WITH DIFFERENTIAL/PLATELET
Basophils Absolute: 0 10*3/uL (ref 0.0–0.1)
Basophils Relative: 0.5 % (ref 0.0–3.0)
Eosinophils Absolute: 0.1 10*3/uL (ref 0.0–0.7)
Eosinophils Relative: 1.4 % (ref 0.0–5.0)
HCT: 40.8 % (ref 36.0–46.0)
Hemoglobin: 12.9 g/dL (ref 12.0–15.0)
Lymphocytes Relative: 36.1 % (ref 12.0–46.0)
Lymphs Abs: 2.4 10*3/uL (ref 0.7–4.0)
MCHC: 31.6 g/dL (ref 30.0–36.0)
MCV: 77.6 fl — ABNORMAL LOW (ref 78.0–100.0)
Monocytes Absolute: 0.5 10*3/uL (ref 0.1–1.0)
Monocytes Relative: 7 % (ref 3.0–12.0)
Neutro Abs: 3.7 10*3/uL (ref 1.4–7.7)
Neutrophils Relative %: 55 % (ref 43.0–77.0)
Platelets: 264 10*3/uL (ref 150.0–400.0)
RBC: 5.25 Mil/uL — ABNORMAL HIGH (ref 3.87–5.11)
RDW: 15 % (ref 11.5–15.5)
WBC: 6.7 10*3/uL (ref 4.0–10.5)

## 2024-03-29 LAB — MICROALBUMIN / CREATININE URINE RATIO
Creatinine,U: 129.5 mg/dL
Microalb Creat Ratio: 39.8 mg/g — ABNORMAL HIGH (ref 0.0–30.0)
Microalb, Ur: 5.2 mg/dL — ABNORMAL HIGH (ref 0.0–1.9)

## 2024-03-29 LAB — COMPREHENSIVE METABOLIC PANEL WITH GFR
ALT: 16 U/L (ref 0–35)
AST: 15 U/L (ref 0–37)
Albumin: 4.5 g/dL (ref 3.5–5.2)
Alkaline Phosphatase: 69 U/L (ref 39–117)
BUN: 13 mg/dL (ref 6–23)
CO2: 28 meq/L (ref 19–32)
Calcium: 10 mg/dL (ref 8.4–10.5)
Chloride: 103 meq/L (ref 96–112)
Creatinine, Ser: 1.04 mg/dL (ref 0.40–1.20)
GFR: 61.25 mL/min (ref >60.00)
Glucose, Bld: 130 mg/dL — ABNORMAL HIGH (ref 70–99)
Potassium: 4.4 meq/L (ref 3.5–5.1)
Sodium: 138 meq/L (ref 135–145)
Total Bilirubin: 0.4 mg/dL (ref 0.2–1.2)
Total Protein: 7.6 g/dL (ref 6.0–8.3)

## 2024-03-29 LAB — LIPID PANEL
Cholesterol: 125 mg/dL (ref 0–200)
HDL: 45.5 mg/dL (ref >39.00)
LDL Cholesterol: 64 mg/dL (ref 0–99)
NonHDL: 79.64
Total CHOL/HDL Ratio: 3
Triglycerides: 76 mg/dL (ref 0.0–149.0)
VLDL: 15.2 mg/dL (ref 0.0–40.0)

## 2024-03-29 LAB — TSH: TSH: 1.68 u[IU]/mL (ref 0.35–5.50)

## 2024-03-29 NOTE — Assessment & Plan Note (Signed)
 Elevated today, unsure if uncontrolled with not taking medication today and failed to follow up back in the fall 2024 for elevated BP. Continue taking Amlodipine  10mg  daily, Irbesartan  150mg  daily, Metoprolol  XL 100mg  daily, and Spironolactone  100mg  daily. Monitor BP BID for 2 weeks and follow up. Ordered CBC and CMP.

## 2024-03-29 NOTE — Patient Instructions (Addendum)
-  It was good to see you today.  -Placed a referral to dermatology for dermatitis and itching. Please call the office or send a MyChart message if you do not receive a phone call or a MyChart message about appointment in 2 weeks.  -Elevated  blood pressure today, unsure if uncontrolled with not taking medication today. Continue taking Amlodipine  10mg  daily, Irbesartan  150mg  daily, Metoprolol  XL 100mg  daily, and Spironolactone  100mg  daily. Monitor blood pressure twice a day for 2 weeks and follow up with readings.  -Continue all other prescribed medications. -Ordered labs. Office will call with lab results and you will see them on MyChart.  -Follow up with endocrinology as scheduled in June. Will send a copy of labs to endocrinology.  -Follow up in 2 weeks.

## 2024-03-29 NOTE — Assessment & Plan Note (Signed)
 Follow up with endocrinology as scheduled on 06/20. Ordered A1c and microalbumin/creatinine urine ratio. Will send labs to endo specialist.

## 2024-03-29 NOTE — Progress Notes (Signed)
 Established Patient Office Visit   Subjective:  Patient ID: Yolanda Herrera, female    DOB: [birth year]  Age: 53 y.o. MRN: [id number]  Chief Complaint  Patient presents with   Medication Management    Has bumps on arms and legs with itching     HPI HTN: Chronic-Patient is taking Amlodipine  10mg  daily, Irbesartan  150mg  daily, Metoprolol  XL 100mg  daily, and Spironolactone  100mg  daily. Unsure if BP is controlled. She denies taking her BP medication today and she was suppose to follow back up in October for elevated BP.   Hyperlipidemia: Chronic. Patient is taking Atorvastatin  20 mg daily. Effective. No complaints of taking medication.  Neuropathy: Chronic. Patient is prescribed Gabapentin  100mg  TID, but takes it at bedtime PRN. Effective.   GERD: Chronic. Patient is prescribed Omeprazole  20mg  BID, but takes it has needed. Not having burning as much as previously.   Patient reports she has been having intermittent itching with bumps. This has been occurring for months. Mostly occurs on arms, feet, legs, and buttocks. She had a telehealth appointment on 01/02, prescribed Prednisone  and Triamcinolone  cream. She reports the Prednisone  helped, but Triamcinolone  cream didn't help as much.  ROS See HPI above     Objective:   BP (!) 152/100   Pulse 82   Temp 98 F (36.7 C) (Oral)   Ht 5\' 3"  (1.6 m)   Wt 188 lb (85.3 kg)   LMP 01/18/2018 (Approximate)   SpO2 95%   BMI 33.30 kg/m    Physical Exam Vitals reviewed.  Constitutional:      General: She is not in acute distress.    Appearance: Normal appearance. She is obese. She is not ill-appearing, toxic-appearing or diaphoretic.  HENT:     Head: Normocephalic and atraumatic.  Eyes:     General:        Right eye: No discharge.        Left eye: No discharge.     Conjunctiva/sclera: Conjunctivae normal.  Cardiovascular:     Rate and Rhythm: Normal rate and regular rhythm.     Heart sounds: Normal heart sounds. No murmur heard.     No friction rub. No gallop.  Pulmonary:     Effort: Pulmonary effort is normal. No respiratory distress.     Breath sounds: Normal breath sounds.  Musculoskeletal:        General: Normal range of motion.  Skin:    General: Skin is warm and dry.  Neurological:     General: No focal deficit present.     Mental Status: She is alert and oriented to person, place, and time. Mental status is at baseline.  Psychiatric:        Mood and Affect: Mood normal.        Behavior: Behavior normal.        Thought Content: Thought content normal.        Judgment: Judgment normal.      Assessment & Plan:  Essential hypertension Assessment & Plan: Elevated today, unsure if uncontrolled with not taking medication today and failed to follow up back in the fall 2024 for elevated BP. Continue taking Amlodipine  10mg  daily, Irbesartan  150mg  daily, Metoprolol  XL 100mg  daily, and Spironolactone  100mg  daily. Monitor BP BID for 2 weeks and follow up. Ordered CBC and CMP.   Orders: -     CBC with Differential/Platelet -     Comprehensive metabolic panel with GFR  Hyperlipidemia, unspecified hyperlipidemia type Assessment & Plan: Continue to take Atrovastatin 20mg  daily.  Ordered lipid panel and CMP to assess liver function. Fasting.    Orders: -     Comprehensive metabolic panel with GFR -     Lipid panel  Type 2 diabetes mellitus with retinopathy of right eye, with long-term current use of insulin , macular edema presence unspecified, unspecified retinopathy severity (HCC) Assessment & Plan: Follow up with endocrinology as scheduled on 06/20. Ordered A1c and microalbumin/creatinine urine ratio. Will send labs to endo specialist.   Orders: -     Hemoglobin A1c -     Microalbumin / creatinine urine ratio  Gastroesophageal reflux disease, unspecified whether esophagitis present Assessment & Plan: Continue with Omeprazole  20mg  daily as needed since it is effective.      Itching -     Ambulatory  referral to Dermatology  Dermatitis -     Ambulatory referral to Dermatology  Class 1 obesity due to excess calories with serious comorbidity and body mass index (BMI) of 33.0 to 33.9 in adult -     CBC with Differential/Platelet -     Comprehensive metabolic panel with GFR -     TSH  -Placed a referral to dermatology for dermatitis and itching. -Ordered CBC, CMP, and TSH based on BMI-obesity.  -Review health maintenance: Declines covid booster and PNA vaccine.  Return in about 2 weeks (around 04/12/2024) for follow-up.   Jennessy Sandridge, NP

## 2024-03-29 NOTE — Assessment & Plan Note (Signed)
Continue with Omeprazole 20mg  daily as needed since it is effective.

## 2024-03-29 NOTE — Assessment & Plan Note (Addendum)
 Continue to take Atrovastatin 20mg  daily. Ordered lipid panel and CMP to assess liver function. Fasting.

## 2024-03-30 ENCOUNTER — Ambulatory Visit: Payer: Self-pay | Admitting: Family Medicine

## 2024-03-30 LAB — HEMOGLOBIN A1C
Est. average glucose Bld gHb Est-mCnc: 278 mg/dL
Hgb A1c MFr Bld: 11.3 % — ABNORMAL HIGH (ref 4.8–5.6)

## 2024-04-20 ENCOUNTER — Encounter: Payer: Self-pay | Admitting: Internal Medicine

## 2024-04-20 ENCOUNTER — Ambulatory Visit: Admitting: Internal Medicine

## 2024-04-20 VITALS — BP 136/80 | HR 86 | Ht 60.3 in | Wt 191.6 lb

## 2024-04-20 DIAGNOSIS — E269 Hyperaldosteronism, unspecified: Secondary | ICD-10-CM

## 2024-04-20 DIAGNOSIS — Z794 Long term (current) use of insulin: Secondary | ICD-10-CM | POA: Diagnosis not present

## 2024-04-20 DIAGNOSIS — R809 Proteinuria, unspecified: Secondary | ICD-10-CM

## 2024-04-20 DIAGNOSIS — E1129 Type 2 diabetes mellitus with other diabetic kidney complication: Secondary | ICD-10-CM

## 2024-04-20 DIAGNOSIS — E1142 Type 2 diabetes mellitus with diabetic polyneuropathy: Secondary | ICD-10-CM

## 2024-04-20 DIAGNOSIS — E1165 Type 2 diabetes mellitus with hyperglycemia: Secondary | ICD-10-CM

## 2024-04-20 DIAGNOSIS — E11319 Type 2 diabetes mellitus with unspecified diabetic retinopathy without macular edema: Secondary | ICD-10-CM

## 2024-04-20 MED ORDER — SEMAGLUTIDE(0.25 OR 0.5MG/DOS) 2 MG/3ML ~~LOC~~ SOPN: 0.5000 mg | PEN_INJECTOR | SUBCUTANEOUS | 3 refills | Status: DC

## 2024-04-20 NOTE — Patient Instructions (Addendum)
-   Increase Ozempic  0.5 mg weekly - Continue Metformin  500 mg XR , 2 tablets daily - Continue pioglitazone   30 mg, 1 tablet daily - Continue Basaglar  50 units once daily - Decrease Humalog  12 units with each meal plus correction scale if needed as below -Humalog  correctional insulin : ADD extra units on insulin  to your meal-time Humalog   dose if your blood sugars are higher than 160. Use the scale below to help guide you:   Blood sugar before meal Number of units to inject  Less than 160 0 unit  161 -  190 1 units  191 -  220 2 units  221 -  250 3 units  251 -  280 4 units  281 -  310 5 units  311 -  340 6 units  341 -  370 7 units  371 -  400 8 units  401 - 430 9 units      - HOW TO TREAT LOW BLOOD SUGARS (Blood sugar LESS THAN 70 MG/DL) Please follow the RULE OF 15 for the treatment of hypoglycemia treatment (when your (blood sugars are less than 70 mg/dL)   STEP 1: Take 15 grams of carbohydrates when your blood sugar is low, which includes:  3-4 GLUCOSE TABS  OR 3-4 OZ OF JUICE OR REGULAR SODA OR ONE TUBE OF GLUCOSE GEL    STEP 2: RECHECK blood sugar in 15 MINUTES STEP 3: If your blood sugar is still low at the 15 minute recheck --> then, go back to STEP 1 and treat AGAIN with another 15 grams of carbohydrates.

## 2024-04-20 NOTE — Progress Notes (Signed)
 Name: Yolanda Herrera  Age/ Sex: 53 y.o., female   MRN/ DOB: [id number], 02/15/1971     PCP: Francenia Ingle, NP   Reason for Endocrinology Evaluation: Type 2 Diabetes Mellitus  Initial Endocrine Consultative Visit: 01/11/2020    PATIENT IDENTIFIER: [name] is a 53 y.o. female with a past medical history of DM and dyslipidemia. The patient has followed with Endocrinology clinic since 01/11/2020 for consultative assistance with management of her diabetes.     DIABETIC HISTORY:  Ms. Ackley was diagnosed with DM in 2011.  She has been on Metformin  since her diagnosis, as well as glimepiride .  Jardiance  was cost prohibitive.  Ozempic  was started in 12/2019.  Her hemoglobin A1c has ranged from 9.2% in 2019, peaking at 14.9% in 2021.   On her initial visit to our clinic, her A1c was 14.9%   , she was on Metformin , glimepiride , Ozempic , and NPH.  We stopped  glimepiride  and NPH, we increase Metformin , continue to Ozempic  and started Lantus .   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   Dexamethasone  suppression test normal 08/2021   She was evaluated by GI for pancreatic cyst 07/2021   Started Actos  07/2023 with an A1c of 14.0%  Started Ozempic  12/2023 which was approved by her insurance  SUBJECTIVE:   During the last visit (01/20/2024): A1c 15.0%    Today (04/20/2024): Ms. Souders is here for follow-up on diabetes management. She checks  glucose multiple times a day to freestyle libre.  She has been noted with rare hypoglycemia during the day   Denies nausea or vomiting  Denies  diarrhea but has constipation  She denies LE edema  Has sharp pain and tingling in the feet , worse at night    HOME DIABETES REGIMEN:  Metformin  500 mg XR, 2 tabs daily Actos  30 mg daily Ozempic  0.25 mg weekly Basaglar  50 units daily Huamlog 14 units with each meal CF: Humalog  (BG-130/30) TIDQAC Avapro  150 mg daily Metoprolol  100 mg daily Amlodipine  10 mg daily spironolactone  100 mg daily    Statin: Yes ACE-I/ARB: Yes   CONTINUOUS GLUCOSE MONITORING RECORD INTERPRETATION    Dates of Recording: 6/7 - 04/20/2024  Sensor description: Freestyle libre 3+  Results statistics:   CGM use % of time 91  Average and SD 158/28.2  Time in range      73%  % Time Above 180 23  % Time above 250 4  % Time Below target 0   Glycemic patterns summary: BGs are optimal overnight and most of the day  Hyperglycemic episodes postprandial  Hypoglycemic episodes occurred sporadically during the day  Overnight periods: Optimal    DIABETIC COMPLICATIONS: Microvascular complications:  Retinopathy ? Right,  Denies: neuropathy  Last eye exam: Completed 12/2019   Macrovascular complications:    Denies: CAD, PVD, CVA  HISTORY:  Past Medical History:  Past Medical History:  Diagnosis Date   Diabetes mellitus  without complication (HCC)    GERD (gastroesophageal reflux disease)    Hyperlipidemia    Hypertension    Past Surgical History:  Past Surgical History:  Procedure Laterality Date   CESAREAN SECTION     heart echo     UTERINE FIBROID SURGERY     Social History:  reports that she has never smoked. She has never used smokeless tobacco. She reports that she does not drink alcohol and does not use drugs. Family History:  Family History  Problem Relation Age of Onset   Diabetes Mother    Diabetes Father    Kidney disease Father     Hypertension Father    Hypertension Sister    Stroke Sister    Diabetes Brother    Eczema Son      HOME MEDICATIONS: Allergies as of 04/20/2024   No Known Allergies      Medication List        Accurate as of April 20, 2024  1:13 PM. If you have any questions, ask your nurse or doctor.          amLODipine  10 MG tablet Commonly known as: NORVASC  Take 1 tablet (10 mg total) by mouth daily.   atorvastatin  20 MG tablet Commonly known as: LIPITOR Take 1 tablet by mouth once daily   Basaglar  KwikPen 100 UNIT/ML Inject 50 Units into the skin daily.   BD Pen Needle Nano 2nd Gen 32G X 4 MM Misc Generic drug: Insulin  Pen Needle 1 Device by Other route in the morning, at noon, in the evening, and at bedtime.   blood glucose meter kit and supplies per insurance preference. Use up to three times daily as directed. E1.9, Z79.4 Please dispense lancets and glucose test stripes to match glucometer, 100 each   FreeStyle Libre 3 Sensor Misc Use one device every 14 (fourteen) days.   gabapentin  100 MG capsule Commonly known as: NEURONTIN  Take 1 capsule (100 mg total) by mouth 3 (three) times daily.   HumaLOG  KwikPen 100 UNIT/ML KwikPen Generic drug: insulin  lispro INJECT 10 UNITS SUBCUTANEOUSLY THREE TIMES DAILY   irbesartan  150 MG tablet Commonly known as: AVAPRO  Take 1 tablet (150 mg total) by mouth daily.   metFORMIN  500 MG 24 hr tablet Commonly known as: Glucophage  XR Take 1 tablet (500 mg total) by mouth in the morning and at bedtime.   metoprolol  succinate 100 MG 24 hr tablet Commonly known as: TOPROL -XL TAKE 1 TABLET BY MOUTH ONCE DAILY.TAKE WITH OR IMMEDIATELY FOLLOWING A MEAL.   omeprazole  20 MG capsule Commonly known as: PRILOSEC Take 1 capsule (20 mg total) by mouth 2 (two) times daily before a meal.   OneTouch Verio test strip Generic drug: glucose blood 1 each by Other route 3 (three) times daily. Use as instructed   pioglitazone  30 MG  tablet Commonly known as: Actos  Take 1 tablet (30 mg total) by mouth daily.   Semaglutide (0.25 or 0.5MG /DOS) 2 MG/3ML Sopn Inject 0.5 mg into the skin once a week.   spironolactone  100 MG tablet Commonly known as: ALDACTONE  Take 1 tablet (100 mg total) by mouth daily.   triamcinolone  0.025 % ointment Commonly known as: KENALOG  Apply 1 Application topically 2 (two) times daily.         OBJECTIVE:   Vital Signs: BP 136/80 (BP Location: Left Arm, Patient Position: Sitting, Cuff Size: Normal)   Pulse 86   Ht 5' 0.3 (1.532 m)   Wt 191 lb 9.6 oz (86.9 kg)   LMP  01/18/2018 (Approximate)   SpO2 99%   BMI 37.05 kg/m   Wt Readings from Last 3 Encounters:  04/20/24 191 lb 9.6 oz (86.9 kg)  03/29/24 188 lb (85.3 kg)  01/20/24 177 lb 3.2 oz (80.4 kg)     Exam: General: Pt appears well and is in NAD  Lungs: Clear with good BS bilat   Heart: RRR  Abdomen:  soft, nontender  Extremities: No pretibial edema. .  Neuro: MS is good with appropriate affect, pt is alert and Ox3    DM foot exam: 07/21/2023 The skin of the feet is intact without sores or ulcerations. The pedal pulses are 2+ on right and 2+ on left. The sensation is decreased  to a screening 5.07, 10 gram monofilament on the right    DATA REVIEWED:  Lab Results  Component Value Date   HGBA1C 11.3 (H) 03/29/2024   HGBA1C >15.0 01/20/2024   HGBA1C >14.0 (H) 06/14/2023    Latest Reference Range & Units 03/29/24 10:30  Sodium 135 - 145 mEq/L 138  Potassium 3.5 - 5.1 mEq/L 4.4  Chloride 96 - 112 mEq/L 103  CO2 19 - 32 mEq/L 28  Glucose 70 - 99 mg/dL 161 (H)  BUN 6 - 23 mg/dL 13  Creatinine 0.96 - 0.45 mg/dL 4.09  Calcium  8.4 - 10.5 mg/dL 81.1  Alkaline Phosphatase 39 - 117 U/L 69  Albumin 3.5 - 5.2 g/dL 4.5  AST 0 - 37 U/L 15  ALT 0 - 35 U/L 16  Total Protein 6.0 - 8.3 g/dL 7.6  Total Bilirubin 0.2 - 1.2 mg/dL 0.4  GFR >91.47 mL/min 61.25    Latest Reference Range & Units 03/29/24 10:30  Total CHOL/HDL  Ratio  3  Cholesterol 0 - 200 mg/dL 829  HDL Cholesterol >56.21 mg/dL 30.86  LDL (calc) 0 - 99 mg/dL 64  MICROALB/CREAT RATIO 0.0 - 30.0 mg/g 39.8 (H)  NonHDL  79.64  Triglycerides 0.0 - 149.0 mg/dL 57.8  VLDL 0.0 - 46.9 mg/dL 62.9  (H): Data is abnormally high   CT abdomen 06/05/2021   IMPRESSION: 1. Bilateral adrenal glands appear normal.    ASSESSMENT / PLAN / RECOMMENDATIONS:   1) Type 2 Diabetes Mellitus, poorly controlled, With retinopathic, neuropathic complications and microalbuminuria- Most recent A1c of 11.3 %  %. Goal A1c < 7.0 %.    -Her A1c continues to be above goal, but in reviewing freestyle libre, over the past 2 weeks her BG readings have improved dramatically -She certainly has been more consistent with glucose checks and taking her medications appropriately -Historically SGLT2 inhibitors have been cost prohibitive - Ozempic  is costing her $75 a month, she was advised to look up online coupons - I will increase Ozempic  and decrease prandial dose of insulin  due to hypoglycemia post bolus  MEDICATIONS: -Continue metformin  500 mg XR, 2 tabs daily -Continue pioglitazone  30 mg daily -Increase Ozempic  0.5 mg weekly -Continue Basaglar  50 units daily -Decrease Humalog  12 units with each meal -Correction factor: Humalog  (BG -130/30) TIDQAC   EDUCATION / INSTRUCTIONS: BG monitoring instructions: Patient is instructed to check her blood sugars 1 times a day. Call [facility] if: BG persistently < 70  I reviewed the Rule of 15 for the treatment of hypoglycemia in detail with the patient. Literature supplied.    2) Diabetic complications:  Eye: Does have known diabetic retinopathy.  Neuro/ Feet: Does have known diabetic peripheral neuropathy. Renal: Patient does not have known baseline CKD. She is  on an  ACEI/ARB at present   3)Hyperaldosteronism:   -In review of records she has been noted to have a suppressed renin<0.167, aldosterone of 17.5  with an elevated Aldo/renin ratio> 104.8, this was done in 2019 through cardiology, she was started on Spironolactone  at the time  - CT scan of adrenal did not show evidence of adenoma 06/2021 - Hyperaldo confirmation offered , but pt opted for medical management with Spironolactone   -I have attempted to decrease amlodipine  in the past, but per patient that resulted in hypertension, no changes - BP well-controlled   Continue spironolactone  100 mg daily Continue Avapro150 mg daily Continue metoprolol  100 mg daily Continue amlodipine  10 mg daily  3.  Microalbuminuria - MA/CR ratio elevated through PCPs office - Patient on ARB - SGLT2 inhibitors are cost prohibitive - Emphasized the importance of optimizing glucose control to prevent further damage   4.  Peripheral neuropathy - She does have prescription for gabapentin  that she has not been using in the past due to lack of symptoms - But he recently, she has been taking it at bedtime due to symptoms  F/U in 3 months  Signed electronically by: Natale Bail, MD  [facility]  [facility] [address] [place], [place] [address] Phone: [phone] FAX: [phone]   CC: Francenia Ingle, NP [id number] [place] [address] Phone: [phone]  Fax: [phone]  Return to Endocrinology clinic as below: Future Appointments  Date Time Provider Department Center  07/25/2024  9:10 AM Jolanda Mccann, Julian Obey, MD [facility] None  11/22/2024 10:00 AM Dellar Fenton, DO [facility] None

## 2024-04-25 ENCOUNTER — Other Ambulatory Visit: Payer: Self-pay | Admitting: Family Medicine

## 2024-04-25 DIAGNOSIS — E1165 Type 2 diabetes mellitus with hyperglycemia: Secondary | ICD-10-CM

## 2024-05-11 ENCOUNTER — Other Ambulatory Visit: Payer: Self-pay | Admitting: Family Medicine

## 2024-05-11 DIAGNOSIS — I10 Essential (primary) hypertension: Secondary | ICD-10-CM

## 2024-05-20 ENCOUNTER — Other Ambulatory Visit: Payer: Self-pay | Admitting: Family Medicine

## 2024-05-20 DIAGNOSIS — I10 Essential (primary) hypertension: Secondary | ICD-10-CM

## 2024-05-28 ENCOUNTER — Telehealth: Payer: Self-pay

## 2024-05-28 NOTE — Telephone Encounter (Signed)
 Pharmacy Patient Advocate Encounter   Received notification from CoverMyMeds that prior authorization for Mounjaro  2.5MG /0.5ML auto-injectors  is due for renewal.   Insurance verification completed.   The patient is insured through [facility].  Action: Medication has been discontinued. Archived Key: [id number]

## 2024-06-02 ENCOUNTER — Other Ambulatory Visit: Payer: Self-pay | Admitting: Family Medicine

## 2024-06-02 DIAGNOSIS — I10 Essential (primary) hypertension: Secondary | ICD-10-CM

## 2024-06-12 ENCOUNTER — Other Ambulatory Visit: Payer: Self-pay | Admitting: Family Medicine

## 2024-06-12 DIAGNOSIS — Z794 Long term (current) use of insulin: Secondary | ICD-10-CM

## 2024-06-12 DIAGNOSIS — E1165 Type 2 diabetes mellitus with hyperglycemia: Secondary | ICD-10-CM

## 2024-07-19 ENCOUNTER — Ambulatory Visit: Admitting: Family Medicine

## 2024-07-20 ENCOUNTER — Telehealth: Payer: Self-pay | Admitting: Family Medicine

## 2024-07-20 NOTE — Telephone Encounter (Signed)
Error/dns ?

## 2024-07-25 ENCOUNTER — Ambulatory Visit: Admitting: Internal Medicine

## 2024-07-26 ENCOUNTER — Other Ambulatory Visit: Payer: Self-pay | Admitting: Family Medicine

## 2024-07-26 DIAGNOSIS — I10 Essential (primary) hypertension: Secondary | ICD-10-CM

## 2024-07-26 DIAGNOSIS — E1165 Type 2 diabetes mellitus with hyperglycemia: Secondary | ICD-10-CM

## 2024-07-28 ENCOUNTER — Other Ambulatory Visit: Payer: Self-pay | Admitting: Family Medicine

## 2024-07-28 DIAGNOSIS — E1165 Type 2 diabetes mellitus with hyperglycemia: Secondary | ICD-10-CM

## 2024-07-30 ENCOUNTER — Other Ambulatory Visit: Payer: Self-pay | Admitting: Internal Medicine

## 2024-07-30 DIAGNOSIS — E1165 Type 2 diabetes mellitus with hyperglycemia: Secondary | ICD-10-CM

## 2024-09-10 ENCOUNTER — Other Ambulatory Visit: Payer: Self-pay | Admitting: Family Medicine

## 2024-09-10 DIAGNOSIS — E1165 Type 2 diabetes mellitus with hyperglycemia: Secondary | ICD-10-CM

## 2024-11-22 ENCOUNTER — Ambulatory Visit: Admitting: Dermatology
